# Patient Record
Sex: Female | Born: 1975
Health system: Southern US, Community
[De-identification: ages and names within clinical notes are randomized; demographics above are authoritative.]

## PROBLEM LIST (undated history)

## (undated) DIAGNOSIS — M722 Plantar fascial fibromatosis: Secondary | ICD-10-CM

## (undated) DIAGNOSIS — J329 Chronic sinusitis, unspecified: Secondary | ICD-10-CM

## (undated) DIAGNOSIS — H8110 Benign paroxysmal vertigo, unspecified ear: Secondary | ICD-10-CM

## (undated) DIAGNOSIS — T7840XA Allergy, unspecified, initial encounter: Secondary | ICD-10-CM

## (undated) DIAGNOSIS — N200 Calculus of kidney: Secondary | ICD-10-CM

## (undated) DIAGNOSIS — G4730 Sleep apnea, unspecified: Secondary | ICD-10-CM

## (undated) DIAGNOSIS — E860 Dehydration: Secondary | ICD-10-CM

## (undated) DIAGNOSIS — I10 Essential (primary) hypertension: Secondary | ICD-10-CM

## (undated) DIAGNOSIS — Z0000 Encounter for general adult medical examination without abnormal findings: Secondary | ICD-10-CM

## (undated) DIAGNOSIS — E785 Hyperlipidemia, unspecified: Secondary | ICD-10-CM

## (undated) DIAGNOSIS — J029 Acute pharyngitis, unspecified: Secondary | ICD-10-CM

## (undated) DIAGNOSIS — R51 Headache: Secondary | ICD-10-CM

## (undated) DIAGNOSIS — F419 Anxiety disorder, unspecified: Secondary | ICD-10-CM

## (undated) DIAGNOSIS — B019 Varicella without complication: Secondary | ICD-10-CM

## (undated) DIAGNOSIS — R5383 Other fatigue: Secondary | ICD-10-CM

## (undated) DIAGNOSIS — H539 Unspecified visual disturbance: Secondary | ICD-10-CM

## (undated) DIAGNOSIS — N823 Fistula of vagina to large intestine: Secondary | ICD-10-CM

## (undated) DIAGNOSIS — M549 Dorsalgia, unspecified: Secondary | ICD-10-CM

## (undated) DIAGNOSIS — N189 Chronic kidney disease, unspecified: Secondary | ICD-10-CM

## (undated) DIAGNOSIS — E663 Overweight: Secondary | ICD-10-CM

## (undated) DIAGNOSIS — R030 Elevated blood-pressure reading, without diagnosis of hypertension: Secondary | ICD-10-CM

## (undated) HISTORY — DX: Dorsalgia, unspecified: M54.9

## (undated) HISTORY — DX: Calculus of kidney: N20.0

## (undated) HISTORY — DX: Anxiety disorder, unspecified: F41.9

## (undated) HISTORY — PX: PERINEAL BODY REPAIR: SHX2216

## (undated) HISTORY — DX: Other fatigue: R53.83

## (undated) HISTORY — DX: Acute pharyngitis, unspecified: J02.9

## (undated) HISTORY — PX: OTHER SURGICAL HISTORY: SHX169

## (undated) HISTORY — PX: WISDOM TOOTH EXTRACTION: SHX21

## (undated) HISTORY — DX: Chronic sinusitis, unspecified: J32.9

## (undated) HISTORY — DX: Allergy, unspecified, initial encounter: T78.40XA

## (undated) HISTORY — DX: Dehydration: E86.0

## (undated) HISTORY — DX: Plantar fascial fibromatosis: M72.2

## (undated) HISTORY — DX: Overweight: E66.3

## (undated) HISTORY — DX: Headache: R51

## (undated) HISTORY — DX: Sleep apnea, unspecified: G47.30

## (undated) HISTORY — DX: Unspecified visual disturbance: H53.9

## (undated) HISTORY — DX: Hyperlipidemia, unspecified: E78.5

## (undated) HISTORY — DX: Fistula of vagina to large intestine: N82.3

## (undated) HISTORY — DX: Encounter for general adult medical examination without abnormal findings: Z00.00

## (undated) HISTORY — DX: Benign paroxysmal vertigo, unspecified ear: H81.10

## (undated) HISTORY — DX: Varicella without complication: B01.9

## (undated) HISTORY — PX: GUM SURGERY: SHX658

## (undated) HISTORY — DX: Elevated blood-pressure reading, without diagnosis of hypertension: R03.0

---

## 1998-03-09 ENCOUNTER — Other Ambulatory Visit: Admission: RE | Admit: 1998-03-09 | Discharge: 1998-03-09 | Payer: Self-pay | Admitting: Obstetrics and Gynecology

## 1999-03-16 ENCOUNTER — Other Ambulatory Visit: Admission: RE | Admit: 1999-03-16 | Discharge: 1999-03-16 | Payer: Self-pay | Admitting: Obstetrics and Gynecology

## 2000-04-17 ENCOUNTER — Other Ambulatory Visit: Admission: RE | Admit: 2000-04-17 | Discharge: 2000-04-17 | Payer: Self-pay | Admitting: Obstetrics and Gynecology

## 2001-05-19 ENCOUNTER — Other Ambulatory Visit: Admission: RE | Admit: 2001-05-19 | Discharge: 2001-05-19 | Payer: Self-pay | Admitting: Obstetrics and Gynecology

## 2002-06-11 ENCOUNTER — Other Ambulatory Visit: Admission: RE | Admit: 2002-06-11 | Discharge: 2002-06-11 | Payer: Self-pay | Admitting: Obstetrics and Gynecology

## 2003-07-05 ENCOUNTER — Other Ambulatory Visit: Admission: RE | Admit: 2003-07-05 | Discharge: 2003-07-05 | Payer: Self-pay | Admitting: Obstetrics and Gynecology

## 2004-03-28 ENCOUNTER — Encounter: Admission: RE | Admit: 2004-03-28 | Discharge: 2004-03-28 | Payer: Self-pay | Admitting: Obstetrics and Gynecology

## 2004-06-13 ENCOUNTER — Encounter: Admission: RE | Admit: 2004-06-13 | Discharge: 2004-06-13 | Payer: Self-pay | Admitting: Family Medicine

## 2004-07-05 ENCOUNTER — Other Ambulatory Visit: Admission: RE | Admit: 2004-07-05 | Discharge: 2004-07-05 | Payer: Self-pay | Admitting: Obstetrics and Gynecology

## 2004-11-06 ENCOUNTER — Other Ambulatory Visit: Admission: RE | Admit: 2004-11-06 | Discharge: 2004-11-06 | Payer: Self-pay | Admitting: Obstetrics and Gynecology

## 2004-11-07 ENCOUNTER — Emergency Department (HOSPITAL_COMMUNITY): Admission: EM | Admit: 2004-11-07 | Discharge: 2004-11-07 | Payer: Self-pay

## 2005-05-25 ENCOUNTER — Inpatient Hospital Stay (HOSPITAL_COMMUNITY): Admission: AD | Admit: 2005-05-25 | Discharge: 2005-05-25 | Payer: Self-pay | Admitting: Obstetrics and Gynecology

## 2005-05-29 ENCOUNTER — Inpatient Hospital Stay (HOSPITAL_COMMUNITY): Admission: AD | Admit: 2005-05-29 | Discharge: 2005-06-02 | Payer: Self-pay | Admitting: Obstetrics and Gynecology

## 2005-06-04 ENCOUNTER — Encounter: Admission: RE | Admit: 2005-06-04 | Discharge: 2005-06-20 | Payer: Self-pay | Admitting: Obstetrics and Gynecology

## 2005-06-13 ENCOUNTER — Inpatient Hospital Stay (HOSPITAL_COMMUNITY): Admission: AD | Admit: 2005-06-13 | Discharge: 2005-06-15 | Payer: Self-pay | Admitting: Obstetrics and Gynecology

## 2005-07-18 ENCOUNTER — Other Ambulatory Visit: Admission: RE | Admit: 2005-07-18 | Discharge: 2005-07-18 | Payer: Self-pay | Admitting: Obstetrics and Gynecology

## 2005-07-22 LAB — HM COLONOSCOPY

## 2007-09-25 ENCOUNTER — Encounter: Admission: RE | Admit: 2007-09-25 | Discharge: 2007-09-25 | Payer: Self-pay | Admitting: Obstetrics and Gynecology

## 2008-01-06 ENCOUNTER — Ambulatory Visit: Payer: Self-pay | Admitting: Gastroenterology

## 2008-01-28 ENCOUNTER — Ambulatory Visit: Payer: Self-pay | Admitting: Gastroenterology

## 2010-01-20 ENCOUNTER — Encounter: Admission: RE | Admit: 2010-01-20 | Discharge: 2010-01-20 | Payer: Self-pay | Admitting: Family Medicine

## 2010-01-25 ENCOUNTER — Encounter: Admission: RE | Admit: 2010-01-25 | Discharge: 2010-01-25 | Payer: Self-pay | Admitting: Family Medicine

## 2010-06-27 DIAGNOSIS — R030 Elevated blood-pressure reading, without diagnosis of hypertension: Secondary | ICD-10-CM | POA: Insufficient documentation

## 2010-06-27 DIAGNOSIS — E559 Vitamin D deficiency, unspecified: Secondary | ICD-10-CM | POA: Insufficient documentation

## 2010-06-27 DIAGNOSIS — R519 Headache, unspecified: Secondary | ICD-10-CM | POA: Insufficient documentation

## 2010-07-20 DIAGNOSIS — J309 Allergic rhinitis, unspecified: Secondary | ICD-10-CM | POA: Insufficient documentation

## 2010-11-11 ENCOUNTER — Encounter: Payer: Self-pay | Admitting: Obstetrics and Gynecology

## 2010-11-12 ENCOUNTER — Encounter: Payer: Self-pay | Admitting: Family Medicine

## 2010-12-18 DIAGNOSIS — B029 Zoster without complications: Secondary | ICD-10-CM | POA: Insufficient documentation

## 2010-12-18 HISTORY — DX: Zoster without complications: B02.9

## 2011-01-21 LAB — HM PAP SMEAR

## 2011-03-06 NOTE — Assessment & Plan Note (Signed)
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 Noland Fordyce, Dr. Herb Grays, and Dr. Richarda Overlie for  preoperative colonoscopy for planned upcoming rectovaginal fistula  repair in the next several months.   Evolet had a vaginal delivery in the summer of 2006 which was  complicated by a perineal tear and a rectovaginal fistula involving the  distal 4 cm of the vagina.  She had repair of this fistula by Dr.  Marcelle Overlie on June 14, 2005 and really did well for several  years until the last several months when she again has noticed a foul  discharge from her rectum and some rectal incontinency.  She denies  abdominal pain, diarrhea, or rectal bleeding.  She also had no upper GI  or hepatobiliary complaints.  Her appetite is good and her weight is  stable.  She denies passing stool from her vagina at this time.   She has no history of inflammatory bowel disease or any extracolonic  manifestations of disease such as arthritis, skin rashes, mouth sores,  etc.  She has no specific food intolerances.  It is of note that the  patient had a barium enema performed on September 25, 2007 by Dr. Janeece Riggers. This showed a small connection between the low rectum and the  lower vaginal area with delayed images at 15 seconds showing contrast  within the upper portion of the vagina.  The balloon was then inflated  and the rest of the colon generally appeared normal including terminal  ileal reflux.   Since that time, the patient has been seen by Dr. Hazle Coca at  [facility] who has planned repair of her rectovaginal fistula  after colonoscopy exam.  He did do a rectovaginal  exam in his office and  was able to see a small fistula at that time and it was delineated with  a small block probe.   PAST MEDICAL HISTORY:  Otherwise noncontributory.   MEDICATIONS:  Depo shots every 3 months.  She denies drug allergies.   FAMILY HISTORY:  Noncontributory.   SOCIAL HISTORY:  She is married and lives with her husband and has one  child.  She has a 12th grade education.  She does not smoke or use  ethanol or give a  history of illicit drug use.   REVIEW OF SYSTEMS:  Entire noncontributory without current  cardiovascular, pulmonary, genitourinary, gynecological, or other  general medical problems.  Her last menstrual period was in November of  2005.   EXAM:  She is a healthy female in no acute distress appearing her stated  age.  She is 5'6 and weighs 218 pounds.  Blood pressure is 122/80 and pulse is 76 and regular.  I could not appreciate stigmata of chronic liver disease or thyromegaly.  Her chest is  clear and she is in a regular rhythm without murmurs,  gallops, or rubs.  I could not appreciate hepatosplenomegaly, abdominal masses, or  tenderness.  Bowel sounds were normal.  Upper extremities were unremarkable.  Inspection of the rectum was generally unremarkable.  I did not perform  a rectal exam or proctoscopy.   ASSESSMENT:  Mrs. Flicker has what sounds like a small rectovaginal  fistula with a previous repair surgically in August of 2006 by Dr.  Vincente Poli.  As per Dr. Scharlene Corn note, this may be a simple operation and he  does not think this will require any extensive invasive procedures.  It  is certainly unlikely the patient has underlying inflammatory bowel  disease or colon polyps, but I would agree that colonoscopy is indicated  before her upcoming surgical procedure.   RECOMMENDATIONS:  I have gone ahead and set colonoscopy up for Mrs.  Sanda at her convenience.  The risks and benefits of this procedure  have been explained in detail and we will  proceed as planned.     Vania Rea. Jarold Motto, MD, Caleen Essex, FAGA  Electronically Signed    DRP/MedQ  DD: 01/06/2008  DT: 01/06/2008  Job #: [id number]   cc:   Ave Filter. Marcelle Overlie, M.D.  Tammy R. Collins [place], M.D.

## 2011-03-09 NOTE — Discharge Summary (Signed)
Erika Weaver, Erika Weaver             ACCOUNT NO.:  [id number]   MEDICAL RECORD NO.:  [id number]          PATIENT TYPE:  INP   LOCATION:  9316                          FACILITY:  [facility]   PHYSICIAN:  Michelle L. Grewal, M.D.DATE OF BIRTH:  Jan 13, 1976   DATE OF ADMISSION:  06/13/2005  DATE OF DISCHARGE:  06/15/2005                                 DISCHARGE SUMMARY   ADMISSION DIAGNOSIS:  Rectovaginal fistula.   DISCHARGE DIAGNOSES:  1.  Rectovaginal fistula.  2.  Status post repair of rectovaginal fistula.   HOSPITAL COURSE:  The patient is a 35 year old female status post vaginal  delivery on May 31, 2005 who presented to the office complaining of stool  passing through her vagina. She had had a third degree laceration, according  to her delivery note. The patient was noted on exam to have a rectovaginal  fistula. She was admitted and placed on sitz baths and antibiotics. The  following day, the patient went to the operating room and had a repair of a  rectovaginal fistula without incident. The patient did very well  postoperatively. By postoperative day #1 she was ambulating, tolerating her  pain very well, and had minimal edema noted. She was discharged home with  pain medication as well as stool softeners. She was advised to follow up in  the office in 1 week. She was advised no driving. She was advised to call if  she notices any more drainage of stool per vagina or any temperature greater  than 100.5 or increased pain in the vagina, and follow up in 1 week for  another exam.           ______________________________  Stann Mainland. Vincente Poli, M.D.     Erika Weaver  D:  08/01/2005  T:  08/01/2005  Job:  [id number]

## 2011-03-09 NOTE — Op Note (Signed)
NAMEFERMINA, Erika Weaver             ACCOUNT NO.:  [id number]   MEDICAL RECORD NO.:  [id number]          PATIENT TYPE:  INP   LOCATION:  9316                          FACILITY:  [facility]   PHYSICIAN:  Michelle L. Grewal, M.D.DATE OF BIRTH:  [birth year]   DATE OF PROCEDURE:  06/14/2005  DATE OF DISCHARGE:                                 OPERATIVE REPORT   PREOPERATIVE DIAGNOSIS:  Rectovaginal fistula.   POSTOPERATIVE DIAGNOSIS:  Rectovaginal fistula.   PROCEDURE:  Repair of rectovaginal fistula.   SURGEON:  Michelle L. Vincente Poli, M.D.   ANESTHESIA:  LMA.   SPECIMENS:  None.   ESTIMATED BLOOD LOSS:  100 mL.   PROCEDURE:  The patient is taken to the operating room and given anesthesia.  She is prepped and draped in the usual sterile fashion and the patient is in  the low lithotomy position.  Exam under anesthesia reveals a complete  breakdown of her episiotomy repair and a rectovaginal fistula involving the  distal 4 cm of the vagina.  There was some stool in the rectum that is  removed.  We then placed Allis clamps just on each side of the rectal  mucosa, and I trimmed off some of the edges that appeared a little friable  and then used a 4-0 Vicryl suture, closed the rectal mucosa with continuous  running stitch starting distally and moving proximally, and then used this  in two layers.  After this was closed, I then placed Allis clamps and  grasped each side of the anal sphincter and then using four figure-of-eight  sutures using 2-0 Vicryl suture, then reapproximated the anal sphincter in  the standard fashion.  After repair of this, I then placed a series of  interrupteds using 0 Vicryl suture between the vagina and the rectal mucosa  and closed the rectovaginal fascia and reapproximated it in the midline.  That way there would be a layer of healthy tissue between the rectum and the  vagina.  I then closed the vaginal epithelium using 2-0 Vicryl in a  continuous running stitch starting  distally and then moving proximally.  We  then closed the skin of the perineum using 2-0 Vicryl in a subcuticular  fashion.  At the end of the procedure, a rectal exam revealed that there was  no rectal defect noted, no stitches were noted in the rectum.  A vaginal  packing soaked with Premarin cream was placed in the vagina.  A Foley  catheter was inserted into the bladder.  All sponge, lap and instrument  counts were correct x2.  The patient went to the recovery room in stable  condition.      Michelle L. Vincente Poli, M.D.  Electronically Signed     MLG/MEDQ  D:  06/14/2005  T:  06/14/2005  Job:  [id number]

## 2011-11-22 ENCOUNTER — Encounter: Payer: Self-pay | Admitting: Family Medicine

## 2011-11-22 ENCOUNTER — Ambulatory Visit (INDEPENDENT_AMBULATORY_CARE_PROVIDER_SITE_OTHER): Payer: Self-pay | Admitting: Family Medicine

## 2011-11-22 DIAGNOSIS — E669 Obesity, unspecified: Secondary | ICD-10-CM | POA: Insufficient documentation

## 2011-11-22 DIAGNOSIS — J329 Chronic sinusitis, unspecified: Secondary | ICD-10-CM | POA: Insufficient documentation

## 2011-11-22 DIAGNOSIS — I10 Essential (primary) hypertension: Secondary | ICD-10-CM | POA: Insufficient documentation

## 2011-11-22 DIAGNOSIS — F419 Anxiety disorder, unspecified: Secondary | ICD-10-CM | POA: Insufficient documentation

## 2011-11-22 DIAGNOSIS — E663 Overweight: Secondary | ICD-10-CM

## 2011-11-22 DIAGNOSIS — H539 Unspecified visual disturbance: Secondary | ICD-10-CM

## 2011-11-22 DIAGNOSIS — Z8249 Family history of ischemic heart disease and other diseases of the circulatory system: Secondary | ICD-10-CM

## 2011-11-22 DIAGNOSIS — T7840XA Allergy, unspecified, initial encounter: Secondary | ICD-10-CM

## 2011-11-22 DIAGNOSIS — R030 Elevated blood-pressure reading, without diagnosis of hypertension: Secondary | ICD-10-CM

## 2011-11-22 DIAGNOSIS — R519 Headache, unspecified: Secondary | ICD-10-CM

## 2011-11-22 DIAGNOSIS — F411 Generalized anxiety disorder: Secondary | ICD-10-CM

## 2011-11-22 DIAGNOSIS — R51 Headache: Secondary | ICD-10-CM

## 2011-11-22 DIAGNOSIS — Z0000 Encounter for general adult medical examination without abnormal findings: Secondary | ICD-10-CM

## 2011-11-22 HISTORY — DX: Chronic sinusitis, unspecified: J32.9

## 2011-11-22 HISTORY — DX: Headache, unspecified: R51.9

## 2011-11-22 HISTORY — DX: Unspecified visual disturbance: H53.9

## 2011-11-22 HISTORY — DX: Allergy, unspecified, initial encounter: T78.40XA

## 2011-11-22 HISTORY — DX: Anxiety disorder, unspecified: F41.9

## 2011-11-22 LAB — LIPID PANEL
Cholesterol: 200 mg/dL (ref 0–200)
HDL: 42.4 mg/dL (ref >39.00)
Total CHOL/HDL Ratio: 5
Triglycerides: 153 mg/dL — ABNORMAL HIGH (ref 0.0–149.0)

## 2011-11-22 LAB — RENAL FUNCTION PANEL
Albumin: 3.7 g/dL (ref 3.5–5.2)
BUN: 13 mg/dL (ref 6–23)
Chloride: 104 mEq/L (ref 96–112)
GFR: 99.95 mL/min (ref >60.00)
Glucose, Bld: 80 mg/dL (ref 70–99)
Phosphorus: 3.1 mg/dL (ref 2.3–4.6)

## 2011-11-22 LAB — HEPATIC FUNCTION PANEL
Albumin: 3.7 g/dL (ref 3.5–5.2)
Alkaline Phosphatase: 74 U/L (ref 39–117)
Total Protein: 7.6 g/dL (ref 6.0–8.3)

## 2011-11-22 LAB — CBC
Hemoglobin: 14 g/dL (ref 12.0–15.0)
MCHC: 34.3 g/dL (ref 30.0–36.0)
MCV: 92.7 fl (ref 78.0–100.0)
RDW: 12.4 % (ref 11.5–14.6)

## 2011-11-22 LAB — TSH: TSH: 0.51 u[IU]/mL (ref 0.35–5.50)

## 2011-11-22 MED ORDER — ALIGN PO CAPS: 1.0000 | ORAL_CAPSULE | Freq: Every day | ORAL | Status: DC

## 2011-11-22 MED ORDER — GUAIFENESIN ER 600 MG PO TB12: 600.0000 mg | ORAL_TABLET | Freq: Two times a day (BID) | ORAL | Status: DC

## 2011-11-22 MED ORDER — SALINE 0.65 % NA SOLN: 1.0000 | Freq: Two times a day (BID) | NASAL | Status: DC

## 2011-11-22 MED ORDER — AMOXICILLIN 500 MG PO CAPS: 500.0000 mg | ORAL_CAPSULE | Freq: Three times a day (TID) | ORAL | Status: AC

## 2011-11-22 MED ORDER — LORATADINE 10 MG PO TABS: 10.0000 mg | ORAL_TABLET | Freq: Every day | ORAL | Status: DC

## 2011-11-22 NOTE — Assessment & Plan Note (Signed)
Patient has been struggling with some intermittent right ear pain, low-grade cough and increased pressure and pain on the right side of her head. She has had some clear rhinorrhea. Has some symptoms of allergies but also suspect a low-grade sinus infection. We'll start amoxicillin 500 mg 3 times a day as well as Mucinex and to have her return for further evaluation

## 2011-11-22 NOTE — Progress Notes (Signed)
Patient ID: Erika Weaver, female   DOB: July 01, 1976, 36 y.o.   MRN: [id number] Erika Weaver [id number] [birth year] 11/22/2011      Progress Note-Follow Up  Subjective  Chief Complaint  Chief Complaint  Patient presents with  . Establish Care    new patient    HPI  Patient is a 36 year old Caucasian female who is in today for new patient appointment. She is most concerned about an episode last week of right-sided headache that also had visual changes in her right eye and paresthesias in her fingertips bilaterally. The symptoms have resolved at this time. She did see her eye doctor [facility] and that was unremarkable she saw her previous PMD and was treated with a medication although she's unsure what it was. Over the next day her headache resolved. She does also note increased congestion, clear rhinorrhea, facial pressure, right ear pain present for several weeks now. No nausea photophobia or phonophobia. No sore throat. No other neurologic complaints. She reports a difficult delivery of her sexual resulted in a fourth degree tear ultimately a rectal fissure and multiple surgeries to repair. Otherwise she reports being in good health and is asked to consider an another pregnancy. No recent fevers, chills, illness, chest pain, palpitations, shortness of breath. She noticed being very anxious about her family history of brain aneurysms and her recent trouble with headaches. She has struggled with headaches in the past as well. She notes her blood pressures frequently elevated when she comes to a doctor's office.  Past Medical History  Diagnosis Date  . Chicken pox as a child  . Overweight   . Headache 11/22/2011  . Allergic state 11/22/2011  . Visual changes 11/22/2011  . Elevated BP 11/22/2011  . Anxiety 11/22/2011  . Sinusitis 11/22/2011    Past Surgical History  Procedure Date  . Gum surgery   . Wisdom tooth extraction     Family History  Problem Relation Age of Onset    . Diabetes Mother     type 2  . Hypertension Mother   . Hyperlipidemia Mother   . Hypertension Father   . Hyperlipidemia Father   . Cancer Maternal Grandmother 42    colon  . Other Maternal Grandfather 41    brain hemorrhage  . Stroke Maternal Grandfather   . Heart attack Paternal Grandmother   . Heart disease Paternal Grandmother     MI  . Alzheimer's disease Paternal Grandfather   . Dementia Paternal Grandfather   . Heart attack Paternal Aunt   . Heart disease Paternal Aunt     MI  . Other Paternal Uncle     cerebreal brain hemorrhage  . Stroke Paternal Uncle   . Asthma Daughter   . Other Daughter     peanut allergy    History   Social History  . Marital Status: Married    Spouse Name: N/A    Number of Children: N/A  . Years of Education: N/A   Occupational History  . Not on file.   Social History Main Topics  . Smoking status: Never Smoker   . Smokeless tobacco: Never Used  . Alcohol Use: No  . Drug Use: No  . Sexually Active: Yes -- Female partner(s)   Other Topics Concern  . Not on file   Social History Narrative  . No narrative on file    No current outpatient prescriptions on file prior to visit.    No Known Allergies  Review of Systems  Review of Systems  Constitutional: Negative for fever, chills and malaise/fatigue.  HENT: Negative for hearing loss, nosebleeds and congestion.   Eyes: Positive for blurred vision. Negative for pain and discharge.  Respiratory: Negative for cough, sputum production, shortness of breath and wheezing.   Cardiovascular: Negative for chest pain, palpitations and leg swelling.  Gastrointestinal: Negative for heartburn, nausea, vomiting, abdominal pain, diarrhea, constipation and blood in stool.  Genitourinary: Negative for dysuria, urgency, frequency and hematuria.  Musculoskeletal: Negative for myalgias, back pain and falls.  Skin: Negative for rash.  Neurological: Positive for tingling and headaches. Negative  for dizziness, tremors, sensory change, focal weakness, loss of consciousness and weakness.  Endo/Heme/Allergies: Negative for polydipsia. Does not bruise/bleed easily.  Psychiatric/Behavioral: Negative for depression and suicidal ideas. The patient is nervous/anxious. The patient does not have insomnia.     Objective  BP 136/86  Pulse 80  Temp(Src) [name] (Temporal)  Ht 5' 5.75" (1.67 m)  Wt 221 lb 1.9 oz (100.299 kg)  BMI 35.96 kg/m2  SpO2 100%  LMP 11/19/2011  Physical Exam  Physical Exam  Constitutional: She is oriented to person, place, and time and well-developed, well-nourished, and in no distress. No distress.  HENT:  Head: Normocephalic and atraumatic.  Right Ear: External ear normal.  Left Ear: External ear normal.  Nose: Nose normal.  Mouth/Throat: Oropharynx is clear and moist. No oropharyngeal exudate.  Eyes: Conjunctivae are normal. Pupils are equal, round, and reactive to light. Right eye exhibits no discharge. Left eye exhibits no discharge. No scleral icterus.  Neck: Normal range of motion. Neck supple. No thyromegaly present.  Cardiovascular: Normal rate, regular rhythm, normal heart sounds and intact distal pulses.   No murmur heard. Pulmonary/Chest: Effort normal and breath sounds normal. No respiratory distress. She has no wheezes. She has no rales.  Abdominal: Soft. Bowel sounds are normal. She exhibits no distension and no mass. There is no tenderness.  Musculoskeletal: Normal range of motion. She exhibits no edema and no tenderness.  Lymphadenopathy:    She has no cervical adenopathy.  Neurological: She is alert and oriented to person, place, and time. She has normal reflexes. No cranial nerve deficit. Coordination normal.  Skin: Skin is warm and dry. No rash noted. She is not diaphoretic.  Psychiatric: Mood, memory and affect normal.    Lab Results  Component Value Date   TSH 0.51 11/22/2011   Lab Results  Component Value Date   WBC  10.4 11/22/2011   HGB 14.0 11/22/2011   HCT 40.8 11/22/2011   MCV 92.7 11/22/2011   PLT 285.0 11/22/2011   Lab Results  Component Value Date   CREATININE 0.7 11/22/2011   BUN 13 11/22/2011   NA 139 11/22/2011   K 3.6 11/22/2011   CL 104 11/22/2011   CO2 28 11/22/2011   Lab Results  Component Value Date   ALT 25 11/22/2011   AST 17 11/22/2011   ALKPHOS 74 11/22/2011   BILITOT 0.2* 11/22/2011   Lab Results  Component Value Date   CHOL 200 11/22/2011   Lab Results  Component Value Date   HDL 42.40 11/22/2011   Lab Results  Component Value Date   LDLCALC 127* 11/22/2011   Lab Results  Component Value Date   TRIG 153.0* 11/22/2011   Lab Results  Component Value Date   CHOLHDL 5 11/22/2011     Assessment & Plan  Allergic state Loratadine 10 mg daily, Ayr twice daily  Overweight Encouraged DASH diet. Increase  activity level  Headache Reports a long history of intermittent headaches the last week had an episode that was worrisome to her. She had increased right-sided headache and then had some difficulty with her vision in her right eye feeling blurred. She initially went to her optometrist [facility] and her eye exam was normal. She went to her previous PMD and was told to migraines. Because she is contemplating pregnancy she was only treated with Tylenol and headache did persist for a few more days. At present her visual changes are improved. She is very anxious about these headaches and does have a very significant family history for brain aneurysms on both sides of her family. His headaches have not had associated nausea photophobia or phonophobia. She is also noting some paresthesias in her fingertips which occurred during the same period of time. It occurred bilaterally and has resolved now.   Elevated BP Minimize sodium and recheck at next visit  Visual changes Resolved, will continue to monitor  Anxiety Patient acknowledges her anxiety level over her family history  of brain aneurysm and her headaches an visual symptoms, if her anxiety worsens or her neuro work up is negative we may have to consider treating this  Sinusitis Patient has been struggling with some intermittent right ear pain, low-grade cough and increased pressure and pain on the right side of her head. She has had some clear rhinorrhea. Has some symptoms of allergies but also suspect a low-grade sinus infection. We'll start amoxicillin 500 mg 3 times a day as well as Mucinex and to have her return for further evaluation

## 2011-11-22 NOTE — Assessment & Plan Note (Signed)
Reports a long history of intermittent headaches the last week had an episode that was worrisome to her. She had increased right-sided headache and then had some difficulty with her vision in her right eye feeling blurred. She initially went to her optometrist [facility] and her eye exam was normal. She went to her previous PMD and was told to migraines. Because she is contemplating pregnancy she was only treated with Tylenol and headache did persist for a few more days. At present her visual changes are improved. She is very anxious about these headaches and does have a very significant family history for brain aneurysms on both sides of her family. His headaches have not had associated nausea photophobia or phonophobia. She is also noting some paresthesias in her fingertips which occurred during the same period of time. It occurred bilaterally and has resolved now.

## 2011-11-22 NOTE — Patient Instructions (Addendum)

## 2011-11-22 NOTE — Assessment & Plan Note (Signed)
Patient acknowledges her anxiety level over her family history of brain aneurysm and her headaches an visual symptoms, if her anxiety worsens or her neuro work up is negative we may have to consider treating this

## 2011-11-22 NOTE — Assessment & Plan Note (Signed)
Resolved, will continue to monitor 

## 2011-11-22 NOTE — Assessment & Plan Note (Signed)
Encouraged DASH diet. Increase activity level

## 2011-11-22 NOTE — Assessment & Plan Note (Signed)
Minimize sodium and recheck at next visit

## 2011-11-22 NOTE — Assessment & Plan Note (Addendum)
Loratadine 10 mg daily, Ayr twice daily

## 2011-11-26 ENCOUNTER — Telehealth: Payer: Self-pay

## 2011-11-26 NOTE — Telephone Encounter (Signed)
Pt stated she can't get into Neurology until the middle of March? Pt states her headaches are a little better but she feels really lightheaded? Pt wants to know if theres anything she should do before her appt in March? Please advise?

## 2011-11-26 NOTE — Telephone Encounter (Signed)
So we can use a different neurologist but if she is somewhat better it would be great to wait. For the light headed feeling that is tricky, stay well hydrated and do not skip meals. Come in and let me check her vitals if she is worried and consider drinking 1 Gatorade beverage daily

## 2011-11-26 NOTE — Telephone Encounter (Signed)
Pt informed and will just keep the appt she has for now

## 2011-12-26 ENCOUNTER — Ambulatory Visit (INDEPENDENT_AMBULATORY_CARE_PROVIDER_SITE_OTHER): Payer: BC Managed Care – PPO | Admitting: Neurology

## 2011-12-26 ENCOUNTER — Encounter: Payer: Self-pay | Admitting: Neurology

## 2011-12-26 DIAGNOSIS — R42 Dizziness and giddiness: Secondary | ICD-10-CM

## 2011-12-26 DIAGNOSIS — R51 Headache: Secondary | ICD-10-CM

## 2011-12-26 DIAGNOSIS — Z8249 Family history of ischemic heart disease and other diseases of the circulatory system: Secondary | ICD-10-CM

## 2011-12-26 NOTE — Patient Instructions (Signed)
Your MRI is scheduled for Monday, March 11th at 9:00am.  Please arrive to [facility], first floor admitting by 8:45am.  [phone].

## 2011-12-26 NOTE — Progress Notes (Signed)
Dear Dr. Abner Greenspan,  Thank you for having me see Erika Weaver in consultation today at [facility] for her problem with dizziness and headaches..  As you may recall, she is a 36 y.o. year old female with a history of anxiety who presents with worsening headaches over last 2 months. These are variably localized without photophobia or phonophobia. She does not think she gets nausea with them. They're typically taken away after about 30 minutes to an hour with Tylenol. On 2 occasions she has had blurriness of the vision right greater than left and tingling in the hands.  She also complains of dizziness/lightheadedness. Sometimes it seems almost vertiginous. Typically this is when she sits up too quickly. However it can occur at other times. Is not clearly brought on by changes in head position. There've been no pulsatile tinnitus or transient visual obscurations. She does not endorse any precipitating event in January.  She does have a history of headaches in the past but been mild. She's not so concerned about the headaches and dizziness it appears but rather that she has a history of cerebral aneurysm in the family. She is wondering whether this could be a cause of her symptoms.  Medical History:  Obesity, anxiety  Surgical History: Wisdom tooth extraction, perineal body repair  Social History: She does not smoke tobacco or use alcohol.  Family History: Mother's sister had a cerebral aneurysm. Maternal grandfather had an intracerebral hemorrhage.  Medications: Prenatal vitamins, loratadine  No Known Allergies    Review of systems:  13 systems were reviewed and are unremarkable.   Examination:  Filed Vitals:   12/26/11 0911  BP: 122/86  Pulse: 76  Height: 5' 5.75" (1.67 m)  Weight: 223 lb (101.152 kg)     In general, well appearing women.  Cardiovascular: The patient has a regular rate and rhythm and no carotid bruits.  Fundoscopy:  Disks are flat. Vessel caliber  within normal limits.  Mental status:   The patient is oriented to person, place and time. Recent and remote memory are intact. Attention span and concentration are normal. Language including repetition, naming, following commands are intact. Fund of knowledge of current and historical events, as well as vocabulary are normal.  Cranial Nerves: Pupils are equally round and reactive to light. Visual fields full to confrontation. Extraocular movements are intact without nystagmus. Facial sensation and muscles of mastication are intact. Muscles of facial expression are symmetric. Hearing intact to bilateral finger rub. Tongue protrusion, uvula, palate midline.  Shoulder shrug intact  Motor:  The patient has normal bulk and tone, no pronator drift.  There are no adventitious movements.  5/5 muscle strength bilaterally.  Reflexes:   Biceps  Triceps Brachioradialis Knee Ankle  Right 2+  2+  2+   2+ 2+  Left  2+  2+  2+   2+ 2+  Toes down  Coordination:  Normal finger to nose.  No dysdiadokinesia.  Sensation is intact to temperature and vibration.  Gait and Station are normal.  Tandem gait is intact.  Romberg is negative  Vest:  - head thrust, - Dix Hallpike  Impression and Recommendations: 1.  New worsening of headaches in the setting of non-first degree history of cerebral aneurysm.  I don't think these headaches could be at all related to aneurysms.  However, given the worsening of headaches I will get an MRI of her brain and an MRA of her head due to her family history.  She will continue to  use Tylenol for her headaches.  I am reluctant to use anything else at this time as she is interested in getting pregnant and Tylenol seems to relieve the headaches. 2.  Dizziness - non specific;  I have encouraged her to increase her fluid intake and try to eat regularly to see if this helps.  We will see the patient back in 2 months.  Thank you for having [place] see Erika Weaver in consultation.   Feel free to contact me with any questions.  Lupita Raider Modesto Charon, MD [facility], [place] [address] [place], [place] [address] Phone: [phone] Fax: [phone].

## 2011-12-31 ENCOUNTER — Other Ambulatory Visit (HOSPITAL_COMMUNITY): Payer: BC Managed Care – PPO

## 2012-01-01 ENCOUNTER — Other Ambulatory Visit (HOSPITAL_COMMUNITY): Payer: BC Managed Care – PPO

## 2012-01-01 ENCOUNTER — Ambulatory Visit (HOSPITAL_COMMUNITY)
Admission: RE | Admit: 2012-01-01 | Discharge: 2012-01-01 | Disposition: A | Discharge status: Home / Self Care | Payer: BC Managed Care – PPO | Source: Ambulatory Visit | Attending: Neurology | Admitting: Neurology

## 2012-01-01 DIAGNOSIS — R42 Dizziness and giddiness: Secondary | ICD-10-CM | POA: Insufficient documentation

## 2012-01-01 DIAGNOSIS — R51 Headache: Secondary | ICD-10-CM | POA: Insufficient documentation

## 2012-01-01 DIAGNOSIS — Z8249 Family history of ischemic heart disease and other diseases of the circulatory system: Secondary | ICD-10-CM

## 2012-01-01 MED ORDER — GADOBENATE DIMEGLUMINE 529 MG/ML IV SOLN
20.0000 mL | Freq: Once | INTRAVENOUS | Status: AC | PRN
  Administered 2012-01-01: 20 mL via INTRAVENOUS

## 2012-01-04 ENCOUNTER — Telehealth: Payer: Self-pay | Admitting: Neurology

## 2012-01-04 NOTE — Telephone Encounter (Signed)
Message copied by Benay Spice on Fri Jan 04, 2012 11:11 AM ------      Message from: Denton Meek H      Created: Thu Jan 03, 2012 10:34 PM       Let Ms. Hayter know that her MRI looked ok with no sign of aneurysm.

## 2012-01-04 NOTE — Telephone Encounter (Signed)
I left the patient a message on her mobile phone that the MRI was normal with no sign of an aneurysm. Asked that she call if questions.

## 2012-01-04 NOTE — Telephone Encounter (Signed)
Message copied by Benay Spice on Fri Jan 04, 2012 12:21 PM ------      Message from: Denton Meek H      Created: Thu Jan 03, 2012 10:34 PM       Let Ms. Rhem know that her MRI looked ok with no sign of aneurysm.

## 2012-01-11 ENCOUNTER — Ambulatory Visit (INDEPENDENT_AMBULATORY_CARE_PROVIDER_SITE_OTHER): Payer: BC Managed Care – PPO | Admitting: Family Medicine

## 2012-01-11 ENCOUNTER — Encounter: Payer: Self-pay | Admitting: Family Medicine

## 2012-01-11 VITALS — BP 144/90 | HR 82 | Temp 98.8°F | Ht 65.75 in | Wt 223.0 lb

## 2012-01-11 DIAGNOSIS — R0609 Other forms of dyspnea: Secondary | ICD-10-CM

## 2012-01-11 DIAGNOSIS — R5383 Other fatigue: Secondary | ICD-10-CM

## 2012-01-11 DIAGNOSIS — M722 Plantar fascial fibromatosis: Secondary | ICD-10-CM | POA: Insufficient documentation

## 2012-01-11 DIAGNOSIS — R51 Headache: Secondary | ICD-10-CM

## 2012-01-11 DIAGNOSIS — J029 Acute pharyngitis, unspecified: Secondary | ICD-10-CM | POA: Insufficient documentation

## 2012-01-11 DIAGNOSIS — G43909 Migraine, unspecified, not intractable, without status migrainosus: Secondary | ICD-10-CM

## 2012-01-11 DIAGNOSIS — G4700 Insomnia, unspecified: Secondary | ICD-10-CM

## 2012-01-11 DIAGNOSIS — R0683 Snoring: Secondary | ICD-10-CM

## 2012-01-11 DIAGNOSIS — R5381 Other malaise: Secondary | ICD-10-CM

## 2012-01-11 DIAGNOSIS — R030 Elevated blood-pressure reading, without diagnosis of hypertension: Secondary | ICD-10-CM

## 2012-01-11 HISTORY — DX: Plantar fascial fibromatosis: M72.2

## 2012-01-11 MED ORDER — PROPRANOLOL HCL 10 MG PO TABS: 10.0000 mg | ORAL_TABLET | Freq: Two times a day (BID) | ORAL | Status: DC

## 2012-01-11 MED ORDER — AMOXICILLIN-POT CLAVULANATE 875-125 MG PO TABS: 1.0000 | ORAL_TABLET | Freq: Two times a day (BID) | ORAL | Status: AC

## 2012-01-11 MED ORDER — METHYLPREDNISOLONE 4 MG PO KIT: PACK | ORAL | Status: AC

## 2012-01-11 NOTE — Assessment & Plan Note (Signed)
Multifactorial but wakes frequently, snores, has HA and elevated BP and enlarged tonsils, suspicious for apnea, agrees to inhome sleep study for further evaluation

## 2012-01-11 NOTE — Assessment & Plan Note (Signed)
Persistent and patient struggling with HA as well, will start Inderal 10 mg po bid and see her back in 1 month or as needed, encourged to try and follow the DASH diet with decreased sodium intake

## 2012-01-11 NOTE — Progress Notes (Signed)
Patient ID: Erika Weaver, female   DOB: [birth year], 36 y.o.   MRN: [id number] Erika Weaver [id number] [birth year] 01/11/2012      Progress Note-Follow Up  Subjective  Chief Complaint  Chief Complaint  Patient presents with  . URI    HA, congestion, post nasal drip, ST x 1 week    HPI  This is a 36-year-old Caucasian female who is in with multiple concerns. She is a long history of pharyngitis and enlarged tonsils due to childhood. This week she developed a sore throat with swelling, facial and nasal congestion. Postnasal drip mild cough and headache. She works in a pediatric office and recheck her for strep earlier in the week and that was negative but unfortunately her symptoms continue to worsen no obvious fevers or chills at malaise and anorexia are noted. She's been seen by neurology and no concerning abnormalities are found she continues to have intermittent almost daily low grade headaches and a sense of lightheadedness. She's been encouraged to increase her hydration and has only been partially successful. She struggled with bilateral plantar fasciitis and had some injections yesterday tried [facility]. No obvious relief yet. She is under a great deal of stress as well and acknowledges that is contributing to her symptoms. On review she is still with fatigue, she is restless sleep and sometimes wakes herself with her snoring. His snoring worse than she used to as well  Past Medical History  Diagnosis Date  . Chicken pox as a child  . Overweight   . Headache 11/22/2011  . Allergic state 11/22/2011  . Visual changes 11/22/2011  . Elevated BP 11/22/2011  . Anxiety 11/22/2011  . Sinusitis 11/22/2011  . Plantar fasciitis 01/11/2012  . Fatigue 01/11/2012  . Pharyngitis 01/11/2012    Past Surgical History  Procedure Date  . Gum surgery   . Wisdom tooth extraction   . Perineal body repair     4 th degree tear with fistula between vagina and retum requiring 3 surgeries for  correction    Family History  Problem Relation Age of Onset  . Diabetes Mother     type 2  . Hypertension Mother   . Hyperlipidemia Mother   . Hypertension Father   . Hyperlipidemia Father   . Cancer Maternal Grandmother 24    colon  . Other Maternal Grandfather 41    brain hemorrhage  . Stroke Maternal Grandfather   . Aneurysm Maternal Grandfather   . Heart attack Paternal Grandmother   . Heart disease Paternal Grandmother     MI  . Alzheimer's disease Paternal Grandfather   . Dementia Paternal Grandfather   . Heart attack Paternal Aunt   . Heart disease Paternal Aunt     MI  . Other Paternal Uncle     cerebreal brain hemorrhage  . Stroke Paternal Uncle   . Aneurysm Paternal Uncle   . Asthma Daughter   . Other Daughter     peanut allergy  . Aneurysm Maternal Uncle 60    coiled, behind eye    History   Social History  . Marital Status: Married    Spouse Name: N/A    Number of Children: N/A  . Years of Education: N/A   Occupational History  . Not on file.   Social History Main Topics  . Smoking status: Never Smoker   . Smokeless tobacco: Never Used  . Alcohol Use: No  . Drug Use: No  . Sexually Active: Yes -- Female partner(s)  Other Topics Concern  . Not on file   Social History Narrative  . No narrative on file    Current Outpatient Prescriptions on File Prior to Visit  Medication Sig Dispense Refill  . Prenatal Vit-Fe Fumarate-FA (PRENATAL MULTIVITAMIN) TABS Take 1 tablet by mouth daily.      . bifidobacterium infantis (ALIGN) capsule Take 1 capsule by mouth daily.  30 capsule  0  . guaiFENesin (MUCINEX) 600 MG 12 hr tablet Take 1 tablet (600 mg total) by mouth 2 (two) times daily.  28 tablet  0  . loratadine (CLARITIN) 10 MG tablet Take 1 tablet (10 mg total) by mouth daily.  30 tablet  11  . Saline 0.65 % (SOLN) SOLN Place 1 spray into the nose 2 (two) times daily.  1 Bottle  0    No Known Allergies  Review of Systems  Review of Systems    Constitutional: Positive for malaise/fatigue. Negative for fever.  HENT: Positive for congestion and sore throat.   Eyes: Negative for discharge.  Respiratory: Positive for cough and sputum production. Negative for shortness of breath.   Cardiovascular: Negative for chest pain, palpitations and leg swelling.  Gastrointestinal: Negative for nausea, abdominal pain and diarrhea.  Genitourinary: Negative for dysuria.  Musculoskeletal: Positive for joint pain. Negative for falls.       B/l heel pain  Skin: Negative for rash.  Neurological: Positive for headaches. Negative for loss of consciousness.  Endo/Heme/Allergies: Negative for polydipsia.  Psychiatric/Behavioral: Negative for depression and suicidal ideas. The patient is nervous/anxious and has insomnia.     Objective  BP 144/90  Pulse 82  Temp(Src) 98.8 F (37.1 C) (Temporal)  Ht 5' 5.75" (1.67 m)  Wt 223 lb (101.152 kg)  BMI 36.27 kg/m2  SpO2 98%  Physical Exam  Physical Exam  Constitutional: She is oriented to person, place, and time and well-developed, well-nourished, and in no distress. No distress.  HENT:  Head: Normocephalic and atraumatic.  Right Ear: External ear normal.  Left Ear: External ear normal.  Nose: Nose normal.       Tonsils 2-3 + b/l, erythematous, TMs mildly erythematous  Eyes: Conjunctivae are normal. Pupils are equal, round, and reactive to light. Right eye exhibits no discharge. Left eye exhibits no discharge. No scleral icterus.  Neck: Normal range of motion. Neck supple. No thyromegaly present.  Cardiovascular: Normal rate, regular rhythm, normal heart sounds and intact distal pulses.   No murmur heard. Pulmonary/Chest: Effort normal and breath sounds normal. No respiratory distress. She has no wheezes. She has no rales.  Abdominal: Soft. Bowel sounds are normal. She exhibits no distension and no mass. There is no tenderness.  Musculoskeletal: Normal range of motion. She exhibits no edema and no  tenderness.  Lymphadenopathy:    She has cervical adenopathy.  Neurological: She is alert and oriented to person, place, and time. She has normal reflexes. No cranial nerve deficit. Coordination normal.  Skin: Skin is warm and dry. No rash noted. She is not diaphoretic.  Psychiatric: Mood, memory and affect normal.    Lab Results  Component Value Date   TSH 0.51 11/22/2011   Lab Results  Component Value Date   WBC 10.4 11/22/2011   HGB 14.0 11/22/2011   HCT 40.8 11/22/2011   MCV 92.7 11/22/2011   PLT 285.0 11/22/2011   Lab Results  Component Value Date   CREATININE 0.7 11/22/2011   BUN 13 11/22/2011   NA 139 11/22/2011   K 3.6 11/22/2011  CL 104 11/22/2011   CO2 28 11/22/2011   Lab Results  Component Value Date   ALT 25 11/22/2011   AST 17 11/22/2011   ALKPHOS 74 11/22/2011   BILITOT 0.2* 11/22/2011   Lab Results  Component Value Date   CHOL 200 11/22/2011   Lab Results  Component Value Date   HDL 42.40 11/22/2011   Lab Results  Component Value Date   LDLCALC 127* 11/22/2011   Lab Results  Component Value Date   TRIG 153.0* 11/22/2011   Lab Results  Component Value Date   CHOLHDL 5 11/22/2011     Assessment & Plan  Elevated BP Persistent and patient struggling with HA as well, will start Inderal 10 mg po bid and see her back in 1 month or as needed, encourged to try and follow the DASH diet with decreased sodium intake  Headache Has been seen by neurology and reassured that all looks well. She is encouraged to increase her hydration as well and Inderal may be helpful although she does not have any associated symptoms with her HA  Plantar fasciitis Was seen by [facility] and had shots in b/l heels yesterday, encouraged to continue stretching, icing and wearing good foot wear  Pharyngitis Works at a pediatrician's office and had a rapid strep culture earlier in the week but unfortunately her symptoms continue to worsen. She is given an rx for antibiotic and  steroid today and we will see her back next month, she has had recurrent trouble and enlarged tonsils for a long time and is developing worsening snoring and fatigue will consider referral to ENT at next visit after she has been treated for acute infection  Fatigue Multifactorial but wakes frequently, snores, has HA and elevated BP and enlarged tonsils, suspicious for apnea, agrees to inhome sleep study for further evaluation

## 2012-01-11 NOTE — Assessment & Plan Note (Signed)
Was seen by [facility] and had shots in b/l heels yesterday, encouraged to continue stretching, icing and wearing good foot wear

## 2012-01-11 NOTE — Patient Instructions (Signed)

## 2012-01-11 NOTE — Assessment & Plan Note (Signed)
Has been seen by neurology and reassured that all looks well. She is encouraged to increase her hydration as well and Inderal may be helpful although she does not have any associated symptoms with her HA

## 2012-01-11 NOTE — Assessment & Plan Note (Signed)
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 She is given an rx for antibiotic and steroid today and we will see her back next month, she has had recurrent trouble and enlarged tonsils for a long time and is developing worsening snoring and fatigue will consider referral to ENT at next visit after she has been treated for acute infection

## 2012-02-15 ENCOUNTER — Ambulatory Visit: Payer: BC Managed Care – PPO | Admitting: Family Medicine

## 2012-02-21 ENCOUNTER — Ambulatory Visit: Payer: BC Managed Care – PPO | Admitting: Family Medicine

## 2012-02-26 ENCOUNTER — Ambulatory Visit: Payer: BC Managed Care – PPO | Admitting: Neurology

## 2012-03-06 ENCOUNTER — Telehealth: Payer: Self-pay

## 2012-03-06 NOTE — Telephone Encounter (Signed)
Patient left a message stating that [facility] w/ [employer] called her and told her she had sleep apnea. Per patient her husband switched jobs and won't have insurance until June 1st. Patient  Is wanting to make sure this is okay to wait? Patient also stated MD has told her her tonsils are very large?  Please advise that it's ok to wait until June 1st? Patient also rescheduled her appt with MD until June 3rd.

## 2012-03-09 NOTE — Telephone Encounter (Signed)
So she should not drink alcohol or take any sleep aides until she is treated. There is some risk from not treating but it if understandable that she wants to wait. Most of the risk from sleep apnea being untreated is long term.

## 2012-03-10 NOTE — Telephone Encounter (Signed)
Left a detailed message on patients cell phone

## 2012-03-11 ENCOUNTER — Encounter: Payer: Self-pay | Admitting: Family Medicine

## 2012-03-24 ENCOUNTER — Ambulatory Visit: Payer: BC Managed Care – PPO | Admitting: Family Medicine

## 2012-04-03 ENCOUNTER — Ambulatory Visit (INDEPENDENT_AMBULATORY_CARE_PROVIDER_SITE_OTHER): Payer: BC Managed Care – PPO | Admitting: Family Medicine

## 2012-04-03 ENCOUNTER — Encounter: Payer: Self-pay | Admitting: Family Medicine

## 2012-04-03 ENCOUNTER — Other Ambulatory Visit: Payer: BC Managed Care – PPO

## 2012-04-03 VITALS — BP 137/87 | HR 76 | Temp 98.0°F | Ht 65.75 in | Wt 223.0 lb

## 2012-04-03 DIAGNOSIS — E663 Overweight: Secondary | ICD-10-CM

## 2012-04-03 DIAGNOSIS — M722 Plantar fascial fibromatosis: Secondary | ICD-10-CM

## 2012-04-03 DIAGNOSIS — R5383 Other fatigue: Secondary | ICD-10-CM

## 2012-04-03 DIAGNOSIS — R030 Elevated blood-pressure reading, without diagnosis of hypertension: Secondary | ICD-10-CM

## 2012-04-03 DIAGNOSIS — I10 Essential (primary) hypertension: Secondary | ICD-10-CM

## 2012-04-03 DIAGNOSIS — G4730 Sleep apnea, unspecified: Secondary | ICD-10-CM

## 2012-04-03 HISTORY — DX: Sleep apnea, unspecified: G47.30

## 2012-04-03 MED ORDER — METOPROLOL SUCCINATE ER 25 MG PO TB24: 25.0000 mg | ORAL_TABLET | Freq: Every day | ORAL | Status: DC

## 2012-04-03 NOTE — Progress Notes (Signed)
Patient ID: Erika Weaver, female   DOB: 1976/08/27, 36 y.o.   MRN: [id number] Erika Weaver [id number] [birth year] 04/03/2012      Progress Note-Follow Up  Subjective  Chief Complaint  Chief Complaint  Patient presents with  . Follow-up    1 month    HPI  Patient is a 36 year old Caucasian female who is in today for followup. She finished her sleep study and has been found to have apnea. His weight machine. Continues to struggle with fatigue. She has a several days late on her period so we do run a pregnancy test today is negative. She's had no other recent illness, fevers, chills, chest pain, palpitations, GI or GU complaints.  Past Medical History  Diagnosis Date  . Chicken pox as a child  . Overweight   . Headache 11/22/2011  . Allergic state 11/22/2011  . Visual changes 11/22/2011  . Elevated BP 11/22/2011  . Anxiety 11/22/2011  . Sinusitis 11/22/2011  . Plantar fasciitis 01/11/2012  . Fatigue 01/11/2012  . Pharyngitis 01/11/2012  . Sleep apnea 04/03/2012    Past Surgical History  Procedure Date  . Gum surgery   . Wisdom tooth extraction   . Perineal body repair     4 th degree tear with fistula between vagina and retum requiring 3 surgeries for correction    Family History  Problem Relation Age of Onset  . Diabetes Mother     type 2  . Hypertension Mother   . Hyperlipidemia Mother   . Hypertension Father   . Hyperlipidemia Father   . Cancer Maternal Grandmother 17    colon  . Other Maternal Grandfather 41    brain hemorrhage  . Stroke Maternal Grandfather   . Aneurysm Maternal Grandfather   . Heart attack Paternal Grandmother   . Heart disease Paternal Grandmother     MI  . Alzheimer's disease Paternal Grandfather   . Dementia Paternal Grandfather   . Heart attack Paternal Aunt   . Heart disease Paternal Aunt     MI  . Other Paternal Uncle     cerebreal brain hemorrhage  . Stroke Paternal Uncle   . Aneurysm Paternal Uncle   . Asthma Daughter   .  Other Daughter     peanut allergy  . Aneurysm Maternal Uncle 60    coiled, behind eye    History   Social History  . Marital Status: Married    Spouse Name: N/A    Number of Children: N/A  . Years of Education: N/A   Occupational History  . Not on file.   Social History Main Topics  . Smoking status: Never Smoker   . Smokeless tobacco: Never Used  . Alcohol Use: No  . Drug Use: No  . Sexually Active: Yes -- Female partner(s)   Other Topics Concern  . Not on file   Social History Narrative  . No narrative on file    Current Outpatient Prescriptions on File Prior to Visit  Medication Sig Dispense Refill  . loratadine (CLARITIN) 10 MG tablet Take 1 tablet (10 mg total) by mouth daily.  30 tablet  11  . Prenatal Vit-Fe Fumarate-FA (PRENATAL MULTIVITAMIN) TABS Take 1 tablet by mouth daily.      . bifidobacterium infantis (ALIGN) capsule Take 1 capsule by mouth daily.  30 capsule  0  . metoprolol succinate (TOPROL-XL) 25 MG 24 hr tablet Take 1 tablet (25 mg total) by mouth daily.  30 tablet  5  No Known Allergies  Review of Systems  Review of Systems  Constitutional: Positive for malaise/fatigue. Negative for fever.  HENT: Negative for congestion.   Eyes: Negative for discharge.  Respiratory: Negative for shortness of breath.   Cardiovascular: Negative for chest pain, palpitations and leg swelling.  Gastrointestinal: Negative for nausea, abdominal pain and diarrhea.  Genitourinary: Negative for dysuria.  Musculoskeletal: Negative for falls.  Skin: Negative for rash.  Neurological: Positive for headaches. Negative for loss of consciousness.  Endo/Heme/Allergies: Negative for polydipsia.  Psychiatric/Behavioral: Negative for depression and suicidal ideas. The patient is not nervous/anxious and does not have insomnia.     Objective  BP 137/87  Pulse 76  Temp 98 F (36.7 C) (Temporal)  Ht 5' 5.75" (1.67 m)  Wt 223 lb (101.152 kg)  BMI 36.27 kg/m2  SpO2 98%   LMP 02/24/2012  Physical Exam  Physical Exam  Constitutional: She is oriented to person, place, and time and well-developed, well-nourished, and in no distress. No distress.  HENT:  Head: Normocephalic and atraumatic.  Eyes: Conjunctivae are normal.  Neck: Neck supple. No thyromegaly present.  Cardiovascular: Normal rate, regular rhythm and normal heart sounds.   No murmur heard. Pulmonary/Chest: Effort normal and breath sounds normal. She has no wheezes.  Abdominal: She exhibits no distension and no mass.  Musculoskeletal: She exhibits no edema.  Lymphadenopathy:    She has no cervical adenopathy.  Neurological: She is alert and oriented to person, place, and time.  Skin: Skin is warm and dry. No rash noted. She is not diaphoretic.  Psychiatric: Memory, affect and judgment normal.    Lab Results  Component Value Date   TSH 0.51 11/22/2011   Lab Results  Component Value Date   WBC 10.4 11/22/2011   HGB 14.0 11/22/2011   HCT 40.8 11/22/2011   MCV 92.7 11/22/2011   PLT 285.0 11/22/2011   Lab Results  Component Value Date   CREATININE 0.7 11/22/2011   BUN 13 11/22/2011   NA 139 11/22/2011   K 3.6 11/22/2011   CL 104 11/22/2011   CO2 28 11/22/2011   Lab Results  Component Value Date   ALT 25 11/22/2011   AST 17 11/22/2011   ALKPHOS 74 11/22/2011   BILITOT 0.2* 11/22/2011   Lab Results  Component Value Date   CHOL 200 11/22/2011   Lab Results  Component Value Date   HDL 42.40 11/22/2011   Lab Results  Component Value Date   LDLCALC 127* 11/22/2011   Lab Results  Component Value Date   TRIG 153.0* 11/22/2011   Lab Results  Component Value Date   CHOLHDL 5 11/22/2011     Assessment & Plan Overweight Encouraged DASH diet and increased exercise.  Sleep apnea Patient has tested positive for sleep apnea, will have her start her CPAP machine  Plantar fasciitis Ice and aspercreme bid and stretch. Better shoes are also recommended  Fatigue Multifactorial and due to  stress and sleep apnea will treat apnea and reassess  Elevated BP Keeps forgetting to take the Propranolol the second dose of the day, will switch to Metoprolol XL 25 mg daily

## 2012-04-03 NOTE — Assessment & Plan Note (Signed)
Keeps forgetting to take the Propranolol the second dose of the day, will switch to Metoprolol XL 25 mg daily

## 2012-04-03 NOTE — Patient Instructions (Addendum)
Sleep Apnea Sleep apnea is a common disorder. The main problem of this disorder is excessive daytime sleepiness and compromised quality of life. This may include social and emotional problems. There are two types of sleep apnea.  Obstructive sleep apnea is when breathing stops due to a blocked airway.   Central sleep apnea is a malfunction of the brain's normal signal to breathe.  SYMPTOMS  Restless sleep.   Falling asleep while driving and/or during the day.   Loss of energy.   Irritability.   Mood or behavior changes.   Loud, heavy snoring.   Morning headaches.   Trouble concentrating.   Forgetfulness.   Anxiety or depression.   Decreased interest in sex.  Not all people with sleep apnea have all of these symptoms. However, people who have a few of these symptoms should visit their caregiver for an evaluation. Problems related to untreated sleep apnea include:  High blood pressure (hypertension).   Coronary artery disease.   Impotence.   Cognitive dysfunction.   Memory loss.  TREATMENT  For mild cases, treatment may include avoiding sleeping on one's back.   For people with nasal congestion, a decongestant may be prescribed.   Patients with obstructive and central apnea should avoid depressants. This includes alcohol, sedatives and narcotics. Weight loss and diet control are encouraged for overweight patients.   Many serious cases of obstructive sleep apnea can be relieved by a treatment called nasal continuous positive airway pressure (nasal CPAP). Nasal CPAP uses a mask-like device and pump that work together to keep the airway open. The pump delivers air pressure during each breath.   Surgery may help some patients by stopping or reducing the narrowing of the airway due to anatomical defects.  PROGNOSIS  Removing the obstruction usually reverses hypertension and cardiac problems. Untreated, sleep apnea sufferers have a tendency to fall asleep during the day.  This is can result in serious accident or loss of ones job. RESEARCH Sleep apnea is currently one of the most active areas of sleep research.  Document Released: 09/28/2002 Document Revised: 09/27/2011 Document Reviewed: 01/24/2006 [employer] Patient Information 2012 [employer]. 

## 2012-04-03 NOTE — Assessment & Plan Note (Signed)
Encouraged DASH diet and increased exercise 

## 2012-04-03 NOTE — Assessment & Plan Note (Signed)
Patient has tested positive for sleep apnea, will have her start her CPAP machine

## 2012-04-03 NOTE — Assessment & Plan Note (Signed)
Ice and aspercreme bid and stretch. Better shoes are also recommended

## 2012-04-03 NOTE — Assessment & Plan Note (Signed)
Multifactorial and due to stress and sleep apnea will treat apnea and reassess

## 2012-04-04 NOTE — Progress Notes (Signed)
Patient ID: Erika Weaver, female   DOB: 1976-04-09, 36 y.o.   MRN: [id number] Duplicate note created in error

## 2012-04-30 ENCOUNTER — Ambulatory Visit: Payer: BC Managed Care – PPO | Admitting: Neurology

## 2012-05-27 ENCOUNTER — Ambulatory Visit (INDEPENDENT_AMBULATORY_CARE_PROVIDER_SITE_OTHER): Payer: BC Managed Care – PPO | Admitting: Neurology

## 2012-05-27 ENCOUNTER — Encounter: Payer: Self-pay | Admitting: Neurology

## 2012-05-27 VITALS — BP 120/84 | HR 72 | Wt 222.0 lb

## 2012-05-27 DIAGNOSIS — R51 Headache: Secondary | ICD-10-CM

## 2012-05-27 NOTE — Progress Notes (Signed)
Dear Dr. Abner Greenspan,  I saw  Erika Weaver back in [facility] for her problem with headaches and lightheadedness in the setting of a family history of aneurysms. At her first appointment she had had an intermittent worsening of headaches that made her worried about possible cerebral aneurysm.  While I felt this was an unlikely cause, I did think that with the new onset of headaches she deserved imaging.  While because of insurance reasons we could not get an MRA, an MRI brain with and without contrast did not demonstrate any aneurysm and I think was a suitable screen in this instance.  She has had complete remission of her headaches.  Her dizziness is better.  She has been diagnosed with OSA and is on CPAP.    Medical history, social history, and family history were reviewed and have not changed since the last clinic visit.  Current Outpatient Prescriptions on File Prior to Visit  Medication Sig Dispense Refill  . metoprolol succinate (TOPROL-XL) 25 MG 24 hr tablet Take 1 tablet (25 mg total) by mouth daily.  30 tablet  5  . Prenatal Vit-Fe Fumarate-FA (PRENATAL MULTIVITAMIN) TABS Take 1 tablet by mouth daily.      . bifidobacterium infantis (ALIGN) capsule Take 1 capsule by mouth daily.  30 capsule  0  . loratadine (CLARITIN) 10 MG tablet Take 1 tablet (10 mg total) by mouth daily.  30 tablet  11    No Known Allergies  ROS:  13 systems were reviewed and are unremarkable.  Exam: . Filed Vitals:   05/27/12 1101  BP: 120/84  Pulse: 72  Weight: 222 lb (100.699 kg)   In general, well appearing women.   Impression/Recommendations:  1.  Headaches - likely tension type, resolved. 2. Family history(not first degree) of cerebral aneurysm - MRI brain with and without was negative for aneurysm.  The patient does not need to see me in follow up.  Lupita Raider Modesto Charon, MD [facility], [place]

## 2012-06-12 ENCOUNTER — Ambulatory Visit (INDEPENDENT_AMBULATORY_CARE_PROVIDER_SITE_OTHER): Payer: BC Managed Care – PPO | Admitting: Family Medicine

## 2012-06-12 ENCOUNTER — Encounter: Payer: Self-pay | Admitting: Family Medicine

## 2012-06-12 VITALS — BP 141/88 | HR 70 | Temp 97.6°F | Ht 61.0 in | Wt 224.4 lb

## 2012-06-12 DIAGNOSIS — R030 Elevated blood-pressure reading, without diagnosis of hypertension: Secondary | ICD-10-CM

## 2012-06-12 DIAGNOSIS — R51 Headache: Secondary | ICD-10-CM

## 2012-06-12 DIAGNOSIS — J069 Acute upper respiratory infection, unspecified: Secondary | ICD-10-CM

## 2012-06-12 DIAGNOSIS — G4700 Insomnia, unspecified: Secondary | ICD-10-CM

## 2012-06-12 DIAGNOSIS — G4730 Sleep apnea, unspecified: Secondary | ICD-10-CM

## 2012-06-12 MED ORDER — ALPRAZOLAM 0.25 MG PO TABS: 0.2500 mg | ORAL_TABLET | Freq: Every evening | ORAL | Status: AC | PRN

## 2012-06-12 MED ORDER — GUAIFENESIN ER 600 MG PO TB12: 600.0000 mg | ORAL_TABLET | Freq: Two times a day (BID) | ORAL | Status: DC

## 2012-06-12 NOTE — Patient Instructions (Addendum)

## 2012-06-16 NOTE — Progress Notes (Signed)
Patient ID: Erika Weaver, female   DOB: [birth year], 36 y.o.   MRN: [id number] [name] [id number] 01/27/1976 06/16/2012      Progress Note-Follow Up  Subjective  Chief Complaint  Chief Complaint  Patient presents with  . Labs Only  . sleeping problems    HPI  Patient is a 36 year old Caucasian female who is in today for followup on her CPAP use. She is using her CPAP regularly each day. Uses it for greater than 4 hours. Reports she feels much better. Her fatigue and headaches. Malaise and poor concentration improved. She denies any chest pain, palpitations, shortness of breath, GI or GU complaints. Has had 2 days worth of sinus congestion with a low-grade headache but no fevers, sore throat, ear pain chest congestion.  Past Medical History  Diagnosis Date  . Chicken pox as a child  . Overweight   . Headache 11/22/2011  . Allergic state 11/22/2011  . Visual changes 11/22/2011  . Elevated BP 11/22/2011  . Anxiety 11/22/2011  . Sinusitis 11/22/2011  . Plantar fasciitis 01/11/2012  . Fatigue 01/11/2012  . Pharyngitis 01/11/2012  . Sleep apnea 04/03/2012    Past Surgical History  Procedure Date  . Gum surgery   . Wisdom tooth extraction   . Perineal body repair     4 th degree tear with fistula between vagina and retum requiring 3 surgeries for correction    Family History  Problem Relation Age of Onset  . Diabetes Mother     type 2  . Hypertension Mother   . Hyperlipidemia Mother   . Hypertension Father   . Hyperlipidemia Father   . Aneurysm Father   . Cancer Maternal Grandmother 54    colon  . Other Maternal Grandfather 41    brain hemorrhage  . Stroke Maternal Grandfather   . Aneurysm Maternal Grandfather   . Heart attack Paternal Grandmother   . Heart disease Paternal Grandmother     MI  . Alzheimer's disease Paternal Grandfather   . Dementia Paternal Grandfather   . Heart attack Paternal Aunt   . Heart disease Paternal Aunt     MI  . Other Paternal  Uncle     cerebreal brain hemorrhage  . Stroke Paternal Uncle   . Aneurysm Paternal Uncle   . Asthma Daughter   . Other Daughter     peanut allergy  . Aneurysm Maternal Uncle 60    coiled, behind eye    History   Social History  . Marital Status: Married    Spouse Name: N/A    Number of Children: N/A  . Years of Education: N/A   Occupational History  . Not on file.   Social History Main Topics  . Smoking status: Never Smoker   . Smokeless tobacco: Never Used  . Alcohol Use: No  . Drug Use: No  . Sexually Active: Yes -- Female partner(s)   Other Topics Concern  . Not on file   Social History Narrative  . No narrative on file    Current Outpatient Prescriptions on File Prior to Visit  Medication Sig Dispense Refill  . bifidobacterium infantis (ALIGN) capsule Take 1 capsule by mouth daily.  30 capsule  0  . metoprolol succinate (TOPROL-XL) 25 MG 24 hr tablet Take 1 tablet (25 mg total) by mouth daily.  30 tablet  5    No Known Allergies  Review of Systems  Review of Systems  Constitutional: Negative for fever and malaise/fatigue.  HENT:  Positive for congestion.   Eyes: Negative for discharge.  Respiratory: Negative for shortness of breath.   Cardiovascular: Negative for chest pain, palpitations and leg swelling.  Gastrointestinal: Negative for nausea, abdominal pain and diarrhea.  Genitourinary: Negative for dysuria.  Musculoskeletal: Negative for falls.  Skin: Negative for rash.  Neurological: Positive for headaches. Negative for loss of consciousness.  Endo/Heme/Allergies: Negative for polydipsia.  Psychiatric/Behavioral: Negative for depression and suicidal ideas. The patient is not nervous/anxious and does not have insomnia.     Objective  BP 141/88  Pulse 70  Temp 97.6 F (36.4 C) (Temporal)  Ht 5\' 1"  (1.549 m)  Wt 224 lb 6.4 oz (101.787 kg)  BMI 42.40 kg/m2  SpO2 99%  LMP 06/11/2012  Physical Exam  Physical Exam  Constitutional: She is  oriented to person, place, and time and well-developed, well-nourished, and in no distress. No distress.  HENT:  Head: Normocephalic and atraumatic.  Eyes: Conjunctivae are normal.  Neck: Neck supple. No thyromegaly present.  Cardiovascular: Normal rate, regular rhythm and normal heart sounds.   No murmur heard. Pulmonary/Chest: Effort normal and breath sounds normal. She has no wheezes.  Abdominal: She exhibits no distension and no mass.  Musculoskeletal: She exhibits no edema.  Lymphadenopathy:    She has no cervical adenopathy.  Neurological: She is alert and oriented to person, place, and time.  Skin: Skin is warm and dry. No rash noted. She is not diaphoretic.  Psychiatric: Memory, affect and judgment normal.    Lab Results  Component Value Date   TSH 0.51 11/22/2011   Lab Results  Component Value Date   WBC 10.4 11/22/2011   HGB 14.0 11/22/2011   HCT 40.8 11/22/2011   MCV 92.7 11/22/2011   PLT 285.0 11/22/2011   Lab Results  Component Value Date   CREATININE 0.7 11/22/2011   BUN 13 11/22/2011   NA 139 11/22/2011   K 3.6 11/22/2011   CL 104 11/22/2011   CO2 28 11/22/2011   Lab Results  Component Value Date   ALT 25 11/22/2011   AST 17 11/22/2011   ALKPHOS 74 11/22/2011   BILITOT 0.2* 11/22/2011   Lab Results  Component Value Date   CHOL 200 11/22/2011   Lab Results  Component Value Date   HDL 42.40 11/22/2011   Lab Results  Component Value Date   LDLCALC 127* 11/22/2011   Lab Results  Component Value Date   TRIG 153.0* 11/22/2011   Lab Results  Component Value Date   CHOLHDL 5 11/22/2011     Assessment & Plan  Sleep apnea Feels much better with CPAP use, is using it every night for greater than 4 hours. Is concentrating better and feels less tired  Headache Has had some congestion with headaches for past few days but does not have frequent headaches recently, has been seen by neurology and imaging has ruled out aneurysm, does have a FH of a father with a brain  aneurysm  Elevated BP Adequate control at today's visit. Minimize sodium and caffeine

## 2012-06-16 NOTE — Assessment & Plan Note (Signed)
Feels much better with CPAP use, is using it every night for greater than 4 hours. Is concentrating better and feels less tired

## 2012-06-16 NOTE — Assessment & Plan Note (Signed)
Has had some congestion with headaches for past few days but does not have frequent headaches recently, has been seen by neurology and imaging has ruled out aneurysm, does have a FH of a father with a brain aneurysm

## 2012-06-16 NOTE — Assessment & Plan Note (Signed)
Adequate control at today's visit. Minimize sodium and caffeine

## 2012-07-01 ENCOUNTER — Encounter (HOSPITAL_BASED_OUTPATIENT_CLINIC_OR_DEPARTMENT_OTHER): Payer: Self-pay

## 2012-07-01 ENCOUNTER — Emergency Department (HOSPITAL_BASED_OUTPATIENT_CLINIC_OR_DEPARTMENT_OTHER): Payer: BC Managed Care – PPO

## 2012-07-01 ENCOUNTER — Emergency Department (HOSPITAL_BASED_OUTPATIENT_CLINIC_OR_DEPARTMENT_OTHER)
Admission: EM | Admit: 2012-07-01 | Discharge: 2012-07-02 | Disposition: A | Discharge status: Home / Self Care | Payer: BC Managed Care – PPO | Attending: Emergency Medicine | Admitting: Emergency Medicine

## 2012-07-01 DIAGNOSIS — R109 Unspecified abdominal pain: Secondary | ICD-10-CM | POA: Insufficient documentation

## 2012-07-01 DIAGNOSIS — G4730 Sleep apnea, unspecified: Secondary | ICD-10-CM | POA: Insufficient documentation

## 2012-07-01 DIAGNOSIS — N200 Calculus of kidney: Secondary | ICD-10-CM | POA: Insufficient documentation

## 2012-07-01 LAB — URINE MICROSCOPIC-ADD ON

## 2012-07-01 LAB — URINALYSIS, ROUTINE W REFLEX MICROSCOPIC
Bilirubin Urine: NEGATIVE
Glucose, UA: NEGATIVE mg/dL
Ketones, ur: 15 mg/dL — AB
Nitrite: NEGATIVE
Specific Gravity, Urine: 1.024 (ref 1.005–1.030)
pH: 6.5 (ref 5.0–8.0)

## 2012-07-01 LAB — CBC WITH DIFFERENTIAL/PLATELET
Basophils Absolute: 0 10*3/uL (ref 0.0–0.1)
Basophils Relative: 0 % (ref 0–1)
Eosinophils Absolute: 0.2 10*3/uL (ref 0.0–0.7)
Lymphs Abs: 2.2 10*3/uL (ref 0.7–4.0)
MCH: 31 pg (ref 26.0–34.0)
MCHC: 35 g/dL (ref 30.0–36.0)
Neutrophils Relative %: 83 % — ABNORMAL HIGH (ref 43–77)
Platelets: 254 10*3/uL (ref 150–400)
RBC: 4.51 MIL/uL (ref 3.87–5.11)

## 2012-07-01 MED ORDER — SODIUM CHLORIDE 0.9 % IV BOLUS (SEPSIS)
1000.0000 mL | Freq: Once | INTRAVENOUS | Status: AC
  Administered 2012-07-01: 1000 mL via INTRAVENOUS

## 2012-07-01 MED ORDER — HYDROMORPHONE HCL PF 1 MG/ML IJ SOLN
1.0000 mg | Freq: Once | INTRAMUSCULAR | Status: AC
  Administered 2012-07-01: 1 mg via INTRAVENOUS
  Filled 2012-07-01: qty 1

## 2012-07-01 MED ORDER — ONDANSETRON HCL 4 MG/2ML IJ SOLN
4.0000 mg | Freq: Once | INTRAMUSCULAR | Status: AC
  Administered 2012-07-01: 4 mg via INTRAVENOUS
  Filled 2012-07-01: qty 2

## 2012-07-01 NOTE — ED Notes (Signed)
C/o abd, left flank, lower back pain, vomiting since 8pm

## 2012-07-02 LAB — BASIC METABOLIC PANEL
GFR calc Af Amer: >90 mL/min (ref >90)
GFR calc non Af Amer: 81 mL/min — ABNORMAL LOW (ref >90)
Potassium: 3.8 mEq/L (ref 3.5–5.1)
Sodium: 138 mEq/L (ref 135–145)

## 2012-07-02 MED ORDER — IBUPROFEN 800 MG PO TABS: 800.0000 mg | ORAL_TABLET | Freq: Three times a day (TID) | ORAL | Status: AC

## 2012-07-02 MED ORDER — ONDANSETRON 8 MG PO TBDP
ORAL_TABLET | ORAL | Status: AC
  Filled 2012-07-02: qty 1

## 2012-07-02 MED ORDER — ONDANSETRON 8 MG PO TBDP
8.0000 mg | ORAL_TABLET | Freq: Once | ORAL | Status: AC
  Administered 2012-07-02: 8 mg via ORAL

## 2012-07-02 MED ORDER — TAMSULOSIN HCL 0.4 MG PO CAPS: 0.4000 mg | ORAL_CAPSULE | Freq: Every day | ORAL | Status: DC

## 2012-07-02 MED ORDER — OXYCODONE-ACETAMINOPHEN 5-325 MG PO TABS: 1.0000 | ORAL_TABLET | Freq: Four times a day (QID) | ORAL | Status: AC | PRN

## 2012-07-02 MED ORDER — OXYCODONE-ACETAMINOPHEN 5-325 MG PO TABS
1.0000 | ORAL_TABLET | Freq: Once | ORAL | Status: AC
  Administered 2012-07-02: 1 via ORAL
  Filled 2012-07-02: qty 1

## 2012-07-02 MED ORDER — OXYCODONE-ACETAMINOPHEN 5-325 MG PO TABS
ORAL_TABLET | ORAL | Status: AC
  Filled 2012-07-02: qty 1

## 2012-07-02 NOTE — ED Notes (Signed)
Pt vomited x 1 as walking to discharge. Pt given ODT zofran.

## 2012-07-02 NOTE — ED Provider Notes (Signed)
History     CSN: [id number]  Arrival date & time 07/01/12  2240   First MD Initiated Contact with Patient 07/01/12 2336      Chief Complaint  Patient presents with  . Abdominal Pain    (Consider location/radiation/quality/duration/timing/severity/associated sxs/prior treatment) HPI Pt reports sudden onset of severe aching and sharp L flank pain, radiating into abdomen about 3 hours prior to arrival associated with nausea and vomiting. No diarrhea, constipation, fever, dysuria or hematuria. Not currently on her menses.  Past Medical History  Diagnosis Date  . Chicken pox as a child  . Overweight   . Headache 11/22/2011  . Allergic state 11/22/2011  . Visual changes 11/22/2011  . Elevated BP 11/22/2011  . Anxiety 11/22/2011  . Sinusitis 11/22/2011  . Plantar fasciitis 01/11/2012  . Fatigue 01/11/2012  . Pharyngitis 01/11/2012  . Sleep apnea 04/03/2012    Past Surgical History  Procedure Date  . Gum surgery   . Wisdom tooth extraction   . Perineal body repair     4 th degree tear with fistula between vagina and retum requiring 3 surgeries for correction    Family History  Problem Relation Age of Onset  . Diabetes Mother     type 2  . Hypertension Mother   . Hyperlipidemia Mother   . Hypertension Father   . Hyperlipidemia Father   . Aneurysm Father   . Cancer Maternal Grandmother 46    colon  . Other Maternal Grandfather 41    brain hemorrhage  . Stroke Maternal Grandfather   . Aneurysm Maternal Grandfather   . Heart attack Paternal Grandmother   . Heart disease Paternal Grandmother     MI  . Alzheimer's disease Paternal Grandfather   . Dementia Paternal Grandfather   . Heart attack Paternal Aunt   . Heart disease Paternal Aunt     MI  . Other Paternal Uncle     cerebreal brain hemorrhage  . Stroke Paternal Uncle   . Aneurysm Paternal Uncle   . Asthma Daughter   . Other Daughter     peanut allergy  . Aneurysm Maternal Uncle 60    coiled, behind eye     History  Substance Use Topics  . Smoking status: Never Smoker   . Smokeless tobacco: Never Used  . Alcohol Use: No    OB History    Grav Para Term Preterm Abortions TAB SAB Ect Mult Living                  Review of Systems All other systems reviewed and are negative except as noted in HPI.   Allergies  Review of patient's allergies indicates no known allergies.  Home Medications   Current Outpatient Rx  Name Route Sig Dispense Refill  . ALPRAZOLAM 0.25 MG PO TABS Oral Take 1 tablet (0.25 mg total) by mouth at bedtime as needed for sleep. 20 tablet 2  . ALIGN PO CAPS Oral Take 1 capsule by mouth daily. 30 capsule 0  . GUAIFENESIN ER 600 MG PO TB12 Oral Take 1 tablet (600 mg total) by mouth 2 (two) times daily. 20 tablet 0  . METOPROLOL SUCCINATE ER 25 MG PO TB24 Oral Take 1 tablet (25 mg total) by mouth daily. 30 tablet 5    BP 115/59  Pulse 70  Temp [name] (Oral)  Resp 16  Ht 5\' 5"  (1.651 m)  Wt 225 lb (102.059 kg)  BMI 37.44 kg/m2  SpO2 100%  LMP  06/11/2012  Physical Exam  Nursing note and vitals reviewed. Constitutional: She is oriented to person, place, and time. She appears well-developed and well-nourished.  HENT:  Head: Normocephalic and atraumatic.  Eyes: EOM are normal. Pupils are equal, round, and reactive to light.  Neck: Normal range of motion. Neck supple.  Cardiovascular: Normal rate, normal heart sounds and intact distal pulses.   Pulmonary/Chest: Effort normal and breath sounds normal.  Abdominal: Bowel sounds are normal. She exhibits no distension. There is no tenderness.  Musculoskeletal: Normal range of motion. She exhibits no edema and no tenderness.  Neurological: She is alert and oriented to person, place, and time. She has normal strength. No cranial nerve deficit or sensory deficit.  Skin: Skin is warm and dry. No rash noted.  Psychiatric: She has a normal mood and affect.    ED Course  Procedures (including critical  care time)  Labs Reviewed  URINALYSIS, ROUTINE W REFLEX MICROSCOPIC - Abnormal; Notable for the following:    Color, Urine AMBER (*)  BIOCHEMICALS MAY BE AFFECTED BY COLOR   APPearance TURBID (*)     Hgb urine dipstick LARGE (*)     Ketones, ur 15 (*)     Protein, ur 30 (*)     Leukocytes, UA SMALL (*)     All other components within normal limits  URINE MICROSCOPIC-ADD ON - Abnormal; Notable for the following:    Squamous Epithelial / LPF FEW (*)     Bacteria, UA FEW (*)     Crystals CA OXALATE CRYSTALS (*)     All other components within normal limits  CBC WITH DIFFERENTIAL - Abnormal; Notable for the following:    WBC 19.6 (*)     Neutrophils Relative 83 (*)     Neutro Abs 16.2 (*)     Lymphocytes Relative 11 (*)     All other components within normal limits  PREGNANCY, URINE  BASIC METABOLIC PANEL   Ct Abdomen Pelvis Wo Contrast  07/02/2012  *RADIOLOGY REPORT*  Clinical Data: Left flank pain.  Back pain.  No abdominal pain.  CT ABDOMEN AND PELVIS WITHOUT CONTRAST  Technique:  Multidetector CT imaging of the abdomen and pelvis was performed following the standard protocol without intravenous contrast.  Comparison: None.  Findings: The there is a 4 mm stone in the proximal left ureter at the ureteropelvic junction with proximal pyelocaliectasis and periureteral stranding. Changes are consistent with moderate obstruction.  The distal ureter is decompressed.  No additional stones are demonstrated.  The bladder is decompressed.  No pyelocaliectasis or ureterectasis on the right.  The unenhanced appearance of the liver, spleen, gallbladder, pancreas, adrenal glands, abdominal aorta, and retroperitoneal lymph nodes is unremarkable.  The stomach, small bowel, and colon are decompressed.  No free air or free fluid in the abdomen.  Pelvis:  The uterus and adnexal structures are not enlarged. Diverticula in the sigmoid colon without diverticulitis.  The appendix is not visualized. No free or  loculated pelvic fluid collections.  Degenerative disc disease at the lumbosacral interspace.  IMPRESSION: 4 mm stone in the proximal left ureter with moderate obstruction.   Original Report Authenticated By: Marlon Pel, M.D.      No diagnosis found.    MDM  Hematuria with calcium oxalate crystals likely a renal stone, consistent with patient's history. Sent for CT, IVF, pain and nausea meds given and patient is feeling better.        Krystan Northrop B. Bernette Mayers, MD 07/02/12 1610

## 2012-08-08 ENCOUNTER — Other Ambulatory Visit: Payer: Self-pay | Admitting: Urology

## 2012-08-14 ENCOUNTER — Encounter (HOSPITAL_COMMUNITY): Payer: Self-pay | Admitting: Pharmacy Technician

## 2012-08-25 ENCOUNTER — Encounter (HOSPITAL_COMMUNITY): Payer: Self-pay | Admitting: *Deleted

## 2012-08-25 NOTE — Pre-Procedure Instructions (Addendum)
Asked to bring blue folder the day of the procedure,insurance card,I.D. driver's license,wear comfortable clothing and have a driver for the day. Asked not to take Advil,Motrin,Ibuprofen,Aleve or any NSAIDS, Aspirin, or Toradol for 72 hours prior to procedure,  No vitamins or herbal medications 7 days prior to procedure. Instructed to take laxative per doctor's office instructions and eat a light dinner the evening before procedure.   To arrive at 1415 for lithotripsy procedure.  Asked to bring cpap machine for the procedure on Monday.

## 2012-09-01 ENCOUNTER — Ambulatory Visit (HOSPITAL_COMMUNITY)
Admission: RE | Admit: 2012-09-01 | Discharge: 2012-09-01 | Disposition: A | Discharge status: Home / Self Care | Payer: BC Managed Care – PPO | Source: Ambulatory Visit | Attending: Urology | Admitting: Urology

## 2012-09-01 ENCOUNTER — Encounter (HOSPITAL_COMMUNITY): Payer: Self-pay | Admitting: *Deleted

## 2012-09-01 ENCOUNTER — Ambulatory Visit (HOSPITAL_COMMUNITY): Payer: BC Managed Care – PPO

## 2012-09-01 ENCOUNTER — Encounter (HOSPITAL_COMMUNITY)
Admission: RE | Disposition: A | Discharge status: Home / Self Care | Payer: Self-pay | Source: Ambulatory Visit | Attending: Urology

## 2012-09-01 DIAGNOSIS — Z79899 Other long term (current) drug therapy: Secondary | ICD-10-CM | POA: Insufficient documentation

## 2012-09-01 DIAGNOSIS — G4730 Sleep apnea, unspecified: Secondary | ICD-10-CM | POA: Insufficient documentation

## 2012-09-01 DIAGNOSIS — I10 Essential (primary) hypertension: Secondary | ICD-10-CM | POA: Insufficient documentation

## 2012-09-01 DIAGNOSIS — N201 Calculus of ureter: Secondary | ICD-10-CM | POA: Insufficient documentation

## 2012-09-01 HISTORY — DX: Essential (primary) hypertension: I10

## 2012-09-01 HISTORY — DX: Chronic kidney disease, unspecified: N18.9

## 2012-09-01 LAB — PREGNANCY, URINE: Preg Test, Ur: NEGATIVE

## 2012-09-01 SURGERY — LITHOTRIPSY, ESWL
Anesthesia: LOCAL | Laterality: Left

## 2012-09-01 MED ORDER — CIPROFLOXACIN HCL 500 MG PO TABS
500.0000 mg | ORAL_TABLET | ORAL | Status: AC
  Administered 2012-09-01: 500 mg via ORAL
  Filled 2012-09-01: qty 1

## 2012-09-01 MED ORDER — DEXTROSE-NACL 5-0.45 % IV SOLN
INTRAVENOUS | Status: DC
  Administered 2012-09-01: 1000 mL via INTRAVENOUS

## 2012-09-01 MED ORDER — DIPHENHYDRAMINE HCL 25 MG PO CAPS
25.0000 mg | ORAL_CAPSULE | ORAL | Status: AC
  Administered 2012-09-01: 25 mg via ORAL
  Filled 2012-09-01: qty 1

## 2012-09-01 MED ORDER — DIAZEPAM 5 MG PO TABS
10.0000 mg | ORAL_TABLET | ORAL | Status: AC
  Administered 2012-09-01: 10 mg via ORAL
  Filled 2012-09-01: qty 2

## 2012-09-01 NOTE — Interval H&P Note (Signed)
History and Physical Interval Note:  09/01/2012 7:58 AM  Erika Weaver  has presented today for surgery, with the diagnosis of LEFT URETERAL STONE  The various methods of treatment have been discussed with the patient and family. After consideration of risks, benefits and other options for treatment, the patient has consented to  Procedure(s) (LRB) with comments: EXTRACORPOREAL SHOCK WAVE LITHOTRIPSY (ESWL) (Left) as a surgical intervention .  The patient's history has been reviewed, patient examined, no change in status, stable for surgery.  I have reviewed the patient's chart and labs.  Questions were answered to the patient's satisfaction.     Eather Chaires A

## 2012-09-01 NOTE — H&P (Signed)
History of Present Illness   Erika Weaver has a 4 mm stone just below the ureteropelvic junction on the left side. She works for [employer]. She did not pass a stone and she was back today with a repeat KUB.  Review of Systems: No change in bowel or neurologic systems.   Urinalysis: 3-6 red blood cells per high-power field.   KUB: I performed a KUB and in my opinion the stone has not moved or has moved minimally. I reviewed the CT Scan again.    Past Medical History Problems  1. History of  Abdominal Pain 789.00 2. History of  Allergic Rhinitis 477.9 3. History of  Anxiety (Symptom) 300.00 4. History of  Exposed To Chicken Pox V01.71 5. History of  Fatigue 780.79 6. History of  Headache 784.0 7. History of  Hypertension 401.9 8. History of  Morbid Obesity (>100% Overweight) 9. History of  Pharyngitis 462 10. History of  Plantar Fasciitis 728.71 11. History of  Sleep Apnea 780.57  Surgical History Problems  1. History of  Oral Surgery 2. History of  Oral Surgery Tooth Extraction 3. History of  Perineal Gynecological Surgery  Current Meds 1. Align; Therapy: (Recorded:11Sep2013) to 2. ALPRAZolam 0.25 MG Oral Tablet; Therapy: 22Aug2013 to 3. Guaifenesin 600 MG TBCR; Therapy: (Recorded:11Sep2013) to 4. Metoprolol Succinate ER 25 MG Oral Tablet Extended Release 24 Hour; Therapy:  (Recorded:11Sep2013) to 5. Promethazine HCl 25 MG Rectal Suppository; INSERT 1 SUPP Every 8 hours; Therapy:  11Sep2013 to (Last Rx:11Sep2013)  Requested for: 11Sep2013  Allergies No Known Allergies  1. No Known Allergies  Family History Problems  1. Paternal history of  Aneurysm Carotid Artery 2. Maternal grandfather's history of  Aneurysm Of The Abdominal Aorta 3. Maternal grandfather's history of  Brain Cancer V16.8 4. Maternal history of  Diabetes Mellitus V18.0 5. Maternal history of  Hyperlipidemia 6. Paternal history of  Hyperlipidemia 7. Maternal history of  Hypertension  V17.49 8. Paternal history of  Hypertension V17.49  Social History Problems  1. Marital History - Currently Married 2. Never A Smoker 3. Occupation: [employer] Denied  4. History of  Alcohol Use  Results/Data  Urine [Data Includes: Last 1 Day]   18Oct2013  COLOR YELLOW   APPEARANCE CLEAR   SPECIFIC GRAVITY 1.020   pH 6.0   GLUCOSE NEG mg/dL  BILIRUBIN NEG   KETONE NEG mg/dL  BLOOD TRACE   PROTEIN NEG mg/dL  UROBILINOGEN 0.2 mg/dL  NITRITE NEG   LEUKOCYTE ESTERASE NEG   SQUAMOUS EPITHELIAL/HPF MODERATE   WBC 0-2 WBC/hpf  RBC 3-6 RBC/hpf  BACTERIA RARE   CRYSTALS NONE SEEN   CASTS NONE SEEN    Assessment Assessed  1. Nephrolithiasis 592.0  Plan  Nephrolithiasis (592.0)  1. Follow-up Week x 2 Office  Follow-up  Requested for: 18Oct2013  Discussion/Summary   I drew Erika Weaver a picture. We decided to treat it with lithotripsy.   We talked about ESWL in detail. Pros, cons, general surgical and anesthetic risks, and other options including watchful waiting and ureteroscopy were discussed. Success and failure rates and need for further/repeat therapy were discussed. Risks were described but not limited to pain, infection, sepsis, and bleeding. The risk of renal and ureteral trauma with short and long term sequelae was discussed. The risk of injury to adjacent structures was discussed. The risk of needing a stent post-ESWL was discussed.  We are going to schedule it in approximately 2 weeks and cancel it if she passes  it in the meantime. Three more weeks of Rapaflo samples given.  After a thorough review of the management options for the patient's condition the patient  elected to proceed with surgical therapy as noted above. We have discussed the potential benefits and risks of the procedure, side effects of the proposed treatment, the likelihood of the patient achieving the goals of the procedure, and any potential problems that might occur during the  procedure or recuperation. Informed consent has been obtained.

## 2012-12-04 ENCOUNTER — Other Ambulatory Visit: Payer: BC Managed Care – PPO

## 2012-12-11 ENCOUNTER — Ambulatory Visit: Payer: BC Managed Care – PPO | Admitting: Family Medicine

## 2012-12-11 ENCOUNTER — Other Ambulatory Visit (INDEPENDENT_AMBULATORY_CARE_PROVIDER_SITE_OTHER): Payer: BC Managed Care – PPO

## 2012-12-11 DIAGNOSIS — Z0000 Encounter for general adult medical examination without abnormal findings: Secondary | ICD-10-CM

## 2012-12-11 LAB — LIPID PANEL
HDL: 35.2 mg/dL — ABNORMAL LOW (ref >39.00)
Total CHOL/HDL Ratio: 6
Triglycerides: 142 mg/dL (ref 0.0–149.0)

## 2012-12-11 LAB — HEPATIC FUNCTION PANEL
ALT: 28 U/L (ref 0–35)
Albumin: 3.5 g/dL (ref 3.5–5.2)
Total Protein: 7 g/dL (ref 6.0–8.3)

## 2012-12-11 LAB — CBC
Hemoglobin: 14 g/dL (ref 12.0–15.0)
MCHC: 34.3 g/dL (ref 30.0–36.0)
MCV: 90.3 fl (ref 78.0–100.0)
Platelets: 270 10*3/uL (ref 150.0–400.0)
RBC: 4.53 Mil/uL (ref 3.87–5.11)

## 2012-12-11 LAB — RENAL FUNCTION PANEL
Albumin: 3.5 g/dL (ref 3.5–5.2)
Chloride: 105 mEq/L (ref 96–112)
GFR: 82.79 mL/min (ref >60.00)
Glucose, Bld: 83 mg/dL (ref 70–99)
Phosphorus: 3.2 mg/dL (ref 2.3–4.6)
Potassium: 4.2 mEq/L (ref 3.5–5.1)

## 2012-12-11 LAB — TSH: TSH: 1.49 u[IU]/mL (ref 0.35–5.50)

## 2012-12-12 ENCOUNTER — Ambulatory Visit: Payer: BC Managed Care – PPO | Admitting: Family Medicine

## 2012-12-19 ENCOUNTER — Ambulatory Visit (INDEPENDENT_AMBULATORY_CARE_PROVIDER_SITE_OTHER): Payer: BC Managed Care – PPO | Admitting: Family Medicine

## 2012-12-19 ENCOUNTER — Encounter: Payer: Self-pay | Admitting: Family Medicine

## 2012-12-19 VITALS — BP 135/83 | HR 75 | Temp 99.0°F | Ht 65.75 in | Wt 221.8 lb

## 2012-12-19 DIAGNOSIS — E785 Hyperlipidemia, unspecified: Secondary | ICD-10-CM

## 2012-12-19 DIAGNOSIS — G4730 Sleep apnea, unspecified: Secondary | ICD-10-CM

## 2012-12-19 DIAGNOSIS — R030 Elevated blood-pressure reading, without diagnosis of hypertension: Secondary | ICD-10-CM

## 2012-12-19 DIAGNOSIS — L309 Dermatitis, unspecified: Secondary | ICD-10-CM

## 2012-12-19 DIAGNOSIS — N200 Calculus of kidney: Secondary | ICD-10-CM

## 2012-12-19 DIAGNOSIS — T7840XD Allergy, unspecified, subsequent encounter: Secondary | ICD-10-CM

## 2012-12-19 DIAGNOSIS — N760 Acute vaginitis: Secondary | ICD-10-CM

## 2012-12-19 MED ORDER — TRIAMCINOLONE ACETONIDE 0.1 % EX CREA: TOPICAL_CREAM | Freq: Every evening | CUTANEOUS | Status: DC | PRN

## 2012-12-19 MED ORDER — KRILL OIL PO CAPS: ORAL_CAPSULE | ORAL | Status: DC

## 2012-12-19 MED ORDER — CYCLOBENZAPRINE HCL 10 MG PO TABS: 10.0000 mg | ORAL_TABLET | Freq: Every evening | ORAL | Status: DC | PRN

## 2012-12-19 MED ORDER — FLUCONAZOLE 150 MG PO TABS: 150.0000 mg | ORAL_TABLET | Freq: Once | ORAL | Status: DC

## 2012-12-19 MED ORDER — PROBIOTIC PRODUCT PO CHEW: CHEWABLE_TABLET | ORAL | Status: DC

## 2012-12-19 NOTE — Patient Instructions (Signed)
Sleep Apnea  Sleep apnea is a sleep disorder characterized by abnormal pauses in breathing while you sleep. When your breathing pauses, the level of oxygen in your blood decreases. This causes you to move out of deep sleep and into light sleep. As a result, your quality of sleep is poor, and the system that carries your blood throughout your body (cardiovascular system) experiences stress. If sleep apnea remains untreated, the following conditions can develop:  High blood pressure (hypertension).  Coronary artery disease.  Inability to achieve or maintain an erection (impotence).  Impairment of your thought process (cognitive dysfunction). There are three types of sleep apnea: 1. Obstructive sleep apnea Pauses in breathing during sleep because of a blocked airway. 2. Central sleep apnea Pauses in breathing during sleep because the area of the brain that controls your breathing does not send the correct signals to the muscles that control breathing. 3. Mixed sleep apnea A combination of both obstructive and central sleep apnea. RISK FACTORS The following risk factors can increase your risk of developing sleep apnea:  Being overweight.  Smoking.  Having narrow passages in your nose and throat.  Being of older age.  Being female.  Alcohol use.  Sedative and tranquilizer use.  Ethnicity. Among individuals younger than 35 years, African Americans are at increased risk of sleep apnea. SYMPTOMS   Difficulty staying asleep.  Daytime sleepiness and fatigue.  Loss of energy.  Irritability.  Loud, heavy snoring.  Morning headaches.  Trouble concentrating.  Forgetfulness.  Decreased interest in sex. DIAGNOSIS  In order to diagnose sleep apnea, your caregiver will perform a physical examination. Your caregiver may suggest that you take a home sleep test. Your caregiver may also recommend that you spend the night in a sleep lab. In the sleep lab, several monitors record  information about your heart, lungs, and brain while you sleep. Your leg and arm movements and blood oxygen level are also recorded. TREATMENT The following actions may help to resolve mild sleep apnea:  Sleeping on your side.   Using a decongestant if you have nasal congestion.   Avoiding the use of depressants, including alcohol, sedatives, and narcotics.   Losing weight and modifying your diet if you are overweight. There also are devices and treatments to help open your airway:  Oral appliances. These are custom-made mouthpieces that shift your lower jaw forward and slightly open your bite. This opens your airway.  Devices that create positive airway pressure. This positive pressure "splints" your airway open to help you breathe better during sleep. The following devices create positive airway pressure:  Continuous positive airway pressure (CPAP) device. The CPAP device creates a continuous level of air pressure with an air pump. The air is delivered to your airway through a mask while you sleep. This continuous pressure keeps your airway open.  Nasal expiratory positive airway pressure (EPAP) device. The EPAP device creates positive air pressure as you exhale. The device consists of single-use valves, which are inserted into each nostril and held in place by adhesive. The valves create very little resistance when you inhale but create much more resistance when you exhale. That increased resistance creates the positive airway pressure. This positive pressure while you exhale keeps your airway open, making it easier to breath when you inhale again.  Bilevel positive airway pressure (BPAP) device. The BPAP device is used mainly in patients with central sleep apnea. This device is similar to the CPAP device because it also uses an air pump to deliver   continuous air pressure through a mask. However, with the BPAP machine, the pressure is set at two different levels. The pressure when you  exhale is lower than the pressure when you inhale.  Surgery. Typically, surgery is only done if you cannot comply with less invasive treatments or if the less invasive treatments do not improve your condition. Surgery involves removing excess tissue in your airway to create a wider passage way. Document Released: 09/28/2002 Document Revised: 04/08/2012 Document Reviewed: 02/14/2012 [employer] Patient Information 2013 [employer].  

## 2012-12-21 ENCOUNTER — Encounter: Payer: Self-pay | Admitting: Family Medicine

## 2012-12-21 DIAGNOSIS — N200 Calculus of kidney: Secondary | ICD-10-CM

## 2012-12-21 HISTORY — DX: Calculus of kidney: N20.0

## 2012-12-21 NOTE — Progress Notes (Signed)
Patient ID: Erika Weaver, female   DOB: November 06, 1975, 37 y.o.   MRN: [id number] DESHAYLA EMPSON [id number] 11/28/1975 12/21/2012      Progress Note-Follow Up  Subjective  Chief Complaint  Chief Complaint  Patient presents with  . Follow-up    6 month    HPI  Patient is a 37 year old Caucasian female in today for followup. She has been struggling with repercussions of a kidney stone since September. She's been sleeping better at this time but has been following with urology after having stone crushed and having trouble clearing. Has recently recovered from bronchitis denies any cough or fevers. No headache, chest pain, palpitations, shortness of, GI or GU concerns. Is using her CPAP regularly  Past Medical History  Diagnosis Date  . Chicken pox as a child  . Overweight   . Headache 11/22/2011  . Allergic state 11/22/2011  . Visual changes 11/22/2011  . Elevated BP 11/22/2011  . Anxiety 11/22/2011  . Sinusitis 11/22/2011  . Plantar fasciitis 01/11/2012  . Fatigue 01/11/2012  . Pharyngitis 01/11/2012  . Sleep apnea 04/03/2012    setting 2  . Chronic kidney disease     kidney stones  . Hypertension   . Kidney stone 12/21/2012    Past Surgical History  Procedure Laterality Date  . Gum surgery    . Wisdom tooth extraction    . Perineal body repair      4 th degree tear with fistula between vagina and retum requiring 3 surgeries for correction    Family History  Problem Relation Age of Onset  . Diabetes Mother     type 2  . Hypertension Mother   . Hyperlipidemia Mother   . Hypertension Father   . Hyperlipidemia Father   . Aneurysm Father     abdominal aorta  . Cancer Maternal Grandmother 75    colon  . Other Maternal Grandfather 41    brain hemorrhage  . Stroke Maternal Grandfather   . Aneurysm Maternal Grandfather   . Heart attack Paternal Grandmother   . Heart disease Paternal Grandmother     MI  . Alzheimer's disease Paternal Grandfather   . Dementia Paternal  Grandfather   . Heart attack Paternal Aunt   . Heart disease Paternal Aunt     MI  . Other Paternal Uncle     cerebreal brain hemorrhage  . Stroke Paternal Uncle   . Aneurysm Paternal Uncle   . Asthma Daughter   . Other Daughter     peanut allergy  . Aneurysm Maternal Uncle 60    coiled, behind eye    History   Social History  . Marital Status: Married    Spouse Name: N/A    Number of Children: N/A  . Years of Education: N/A   Occupational History  . Not on file.   Social History Main Topics  . Smoking status: Never Smoker   . Smokeless tobacco: Never Used  . Alcohol Use: No  . Drug Use: No  . Sexually Active: Not on file   Other Topics Concern  . Not on file   Social History Narrative  . No narrative on file    Current Outpatient Prescriptions on File Prior to Visit  Medication Sig Dispense Refill  . metoprolol succinate (TOPROL-XL) 25 MG 24 hr tablet Take 25 mg by mouth daily. Tries to take mid morning       No current facility-administered medications on file prior to visit.    No Known  Allergies  Review of Systems  Review of Systems  Constitutional: Negative for fever and malaise/fatigue.  HENT: Positive for congestion.   Eyes: Negative for discharge.  Respiratory: Positive for cough. Negative for shortness of breath.   Cardiovascular: Negative for chest pain, palpitations and leg swelling.  Gastrointestinal: Negative for nausea, abdominal pain and diarrhea.  Genitourinary: Negative for dysuria.  Musculoskeletal: Negative for falls.  Skin: Negative for rash.  Neurological: Negative for loss of consciousness and headaches.  Endo/Heme/Allergies: Negative for polydipsia.  Psychiatric/Behavioral: Negative for depression and suicidal ideas. The patient is not nervous/anxious and does not have insomnia.     Objective  BP 135/83  Pulse 75  Temp(Src) 99 F (37.2 C) (Temporal)  Ht 5' 5.75" (1.67 m)  Wt 221 lb 12.8 oz (100.608 kg)  BMI 36.07 kg/m2   SpO2 99%  LMP 11/28/2012  Physical Exam  Physical Exam  Constitutional: She is oriented to person, place, and time and well-developed, well-nourished, and in no distress. No distress.  HENT:  Head: Normocephalic and atraumatic.  Eyes: Conjunctivae are normal.  Neck: Neck supple. No thyromegaly present.  Cardiovascular: Normal rate, regular rhythm and normal heart sounds.   No murmur heard. Pulmonary/Chest: Effort normal and breath sounds normal. She has no wheezes.  Abdominal: She exhibits no distension and no mass.  Musculoskeletal: She exhibits no edema.  Lymphadenopathy:    She has no cervical adenopathy.  Neurological: She is alert and oriented to person, place, and time.  Skin: Skin is warm and dry. No rash noted. She is not diaphoretic.  Psychiatric: Memory, affect and judgment normal.    Lab Results  Component Value Date   TSH 1.49 12/11/2012   Lab Results  Component Value Date   WBC 9.1 12/11/2012   HGB 14.0 12/11/2012   HCT 40.9 12/11/2012   MCV 90.3 12/11/2012   PLT 270.0 12/11/2012   Lab Results  Component Value Date   CREATININE 0.8 12/11/2012   BUN 17 12/11/2012   Mina Carlisi 136 12/11/2012   K 4.2 12/11/2012   CL 105 12/11/2012   CO2 26 12/11/2012   Lab Results  Component Value Date   ALT 28 12/11/2012   AST 17 12/11/2012   ALKPHOS 75 12/11/2012   BILITOT 0.8 12/11/2012   Lab Results  Component Value Date   CHOL 198 12/11/2012   Lab Results  Component Value Date   HDL 35.20* 12/11/2012   Lab Results  Component Value Date   LDLCALC 134* 12/11/2012   Lab Results  Component Value Date   TRIG 142.0 12/11/2012   Lab Results  Component Value Date   CHOLHDL 6 12/11/2012     Assessment & Plan  Elevated BP Well controlled today  Kidney stone In September had to have a stone destroyed and has been following closely with Urology ever since. Is doing much better now. enocuraged good hydration  Sleep apnea Uses CPAP regularly and feels well   Allergic  state Recovering form couhg and treated as a flu contact.

## 2012-12-21 NOTE — Assessment & Plan Note (Signed)
In September had to have a stone destroyed and has been following closely with Urology ever since. Is doing much better now. enocuraged good hydration

## 2012-12-21 NOTE — Assessment & Plan Note (Signed)
Well controlled today.

## 2012-12-21 NOTE — Assessment & Plan Note (Signed)
Uses CPAP regularly and feels well

## 2012-12-21 NOTE — Assessment & Plan Note (Signed)
Recovering form couhg and treated as a flu contact.

## 2013-01-30 ENCOUNTER — Ambulatory Visit (INDEPENDENT_AMBULATORY_CARE_PROVIDER_SITE_OTHER): Payer: BC Managed Care – PPO | Admitting: Family Medicine

## 2013-01-30 ENCOUNTER — Encounter: Payer: Self-pay | Admitting: Family Medicine

## 2013-01-30 VITALS — BP 132/82 | HR 71 | Temp 99.2°F | Ht 65.75 in | Wt 221.0 lb

## 2013-01-30 DIAGNOSIS — M25519 Pain in unspecified shoulder: Secondary | ICD-10-CM

## 2013-01-30 DIAGNOSIS — N823 Fistula of vagina to large intestine: Secondary | ICD-10-CM

## 2013-01-30 DIAGNOSIS — N200 Calculus of kidney: Secondary | ICD-10-CM

## 2013-01-30 DIAGNOSIS — N824 Other female intestinal-genital tract fistulae: Secondary | ICD-10-CM

## 2013-01-30 DIAGNOSIS — R030 Elevated blood-pressure reading, without diagnosis of hypertension: Secondary | ICD-10-CM

## 2013-01-30 DIAGNOSIS — M25511 Pain in right shoulder: Secondary | ICD-10-CM

## 2013-01-30 DIAGNOSIS — E860 Dehydration: Secondary | ICD-10-CM

## 2013-01-30 DIAGNOSIS — M549 Dorsalgia, unspecified: Secondary | ICD-10-CM

## 2013-01-30 MED ORDER — METHOCARBAMOL 500 MG PO TABS: 500.0000 mg | ORAL_TABLET | Freq: Three times a day (TID) | ORAL | Status: DC | PRN

## 2013-01-30 MED ORDER — NAPROXEN SODIUM 220 MG PO TABS: 220.0000 mg | ORAL_TABLET | Freq: Two times a day (BID) | ORAL | Status: DC | PRN

## 2013-01-30 NOTE — Patient Instructions (Addendum)

## 2013-01-31 ENCOUNTER — Encounter: Payer: Self-pay | Admitting: Family Medicine

## 2013-01-31 DIAGNOSIS — M549 Dorsalgia, unspecified: Secondary | ICD-10-CM | POA: Insufficient documentation

## 2013-01-31 DIAGNOSIS — N823 Fistula of vagina to large intestine: Secondary | ICD-10-CM

## 2013-01-31 DIAGNOSIS — E860 Dehydration: Secondary | ICD-10-CM | POA: Insufficient documentation

## 2013-01-31 HISTORY — DX: Dorsalgia, unspecified: M54.9

## 2013-01-31 HISTORY — DX: Fistula of vagina to large intestine: N82.3

## 2013-01-31 NOTE — Assessment & Plan Note (Signed)
Has just been told by her GYN that this will require repair, no significant symptoms at this time

## 2013-01-31 NOTE — Assessment & Plan Note (Signed)
May try heat patches. Switch from Flexeril to Robaxin 500 mg tid as tolerated and as needed to see if she tolerates. Try Aleve 220 mg po bid as needed for pain and encouraged weight loss and increased exercise. Xray next week.

## 2013-01-31 NOTE — Assessment & Plan Note (Signed)
Well controlled at today's visit. 

## 2013-01-31 NOTE — Assessment & Plan Note (Signed)
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 Encouraged a switch from Dr Reino Kent to Gatorade and increase hydation and make sure she eats proteins with every meal

## 2013-01-31 NOTE — Assessment & Plan Note (Signed)
No recent stones, has given up tea

## 2013-01-31 NOTE — Progress Notes (Signed)
Patient ID: Erika Weaver, female   DOB: [birth year], 37 y.o.   MRN: [id number] Erika Weaver [id number] 12-14-1975 01/31/2013      Progress Note-Follow Up  Subjective  Chief Complaint  Chief Complaint  Patient presents with  . Dizziness    random dizzy spells    HPI  Patient is a 37 year old Caucasian female who is in today to discuss episodes of feeling slightly lightheaded. They occur only occasionally and her not long lived. She does not note a pattern of position changes or activity. He can occur anytime during the day. They're not associated with eating or not eating. 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 She is noting some persistent shoulder and muscle pain. She has some low back pain which is tolerable and does not radiate but is occurring frequently. No incontinence GI or GU concerns. Except for her gynecologist has recently told her on exam obesity rectovaginal fistula. So far it is not symptomatic but she is in need of repair. She's had no further kidney stones status post lithotripsy she has given up her sweet tea but continues to Dr. Reino Weaver. Chest pain or palpitations. No headaches or other neurologic complaints. No GI or GU concerns at this time.  Past Medical History  Diagnosis Date  . Chicken pox as a child  . Overweight   . Headache 11/22/2011  . Allergic state 11/22/2011  . Visual changes 11/22/2011  . Elevated BP 11/22/2011  . Anxiety 11/22/2011  . Sinusitis 11/22/2011  . Plantar fasciitis 01/11/2012  . Fatigue 01/11/2012  . Pharyngitis 01/11/2012  . Sleep apnea 04/03/2012    setting 2  . Chronic kidney disease     kidney stones  . Hypertension   . Kidney stone 12/21/2012  . Back pain 01/31/2013  . 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 Dehydration 01/31/2013    Past Surgical History  Procedure Laterality Date  . Gum surgery    . Wisdom tooth extraction    . Perineal body repair      4 th degree tear with fistula between vagina  and retum requiring 3 surgeries for correction    Family History  Problem Relation Age of Onset  . Diabetes Mother     type 2  . Hypertension Mother   . Hyperlipidemia Mother   . Hypertension Father   . Hyperlipidemia Father   . Aneurysm Father     abdominal aorta  . Cancer Maternal Grandmother 51    colon  . Other Maternal Grandfather 41    brain hemorrhage  . Stroke Maternal Grandfather   . Aneurysm Maternal Grandfather   . Heart attack Paternal Grandmother   . Heart disease Paternal Grandmother     MI  . Alzheimer's disease Paternal Grandfather   . Dementia Paternal Grandfather   . Heart attack Paternal Aunt   . Heart disease Paternal Aunt     MI  . Other Paternal Uncle     cerebreal brain hemorrhage  . Stroke Paternal Uncle   . Aneurysm Paternal Uncle   . Asthma Daughter   . Other Daughter     peanut allergy  . Aneurysm Maternal Uncle 60    coiled, behind eye    History   Social History  . Marital Status: Married    Spouse Name: N/A    Number of Children: N/A  . Years of Education: N/A   Occupational History  . Not on file.   Social History  Main Topics  . Smoking status: Never Smoker   . Smokeless tobacco: Never Used  . Alcohol Use: No  . Drug Use: No  . Sexually Active: Not on file   Other Topics Concern  . Not on file   Social History Narrative  . No narrative on file    Current Outpatient Prescriptions on File Prior to Visit  Medication Sig Dispense Refill  . metoprolol succinate (TOPROL-XL) 25 MG 24 hr tablet Take 25 mg by mouth daily. Tries to take mid morning       No current facility-administered medications on file prior to visit.    No Known Allergies  Review of Systems  Review of Systems  Constitutional: Negative for fever and malaise/fatigue.  HENT: Negative for congestion.   Eyes: Negative for discharge.  Respiratory: Negative for shortness of breath.   Cardiovascular: Negative for chest pain, palpitations and leg  swelling.  Gastrointestinal: Negative for nausea, abdominal pain and diarrhea.  Genitourinary: Negative for dysuria.  Musculoskeletal: Positive for back pain. Negative for falls.  Skin: Negative for rash.  Neurological: Positive for dizziness. Negative for loss of consciousness and headaches.  Endo/Heme/Allergies: Negative for polydipsia.  Psychiatric/Behavioral: Negative for depression and suicidal ideas. The patient is not nervous/anxious and does not have insomnia.     Objective  BP 132/82  Pulse 71  Temp(Src) 99.2 F (37.3 C) (Temporal)  Ht 5' 5.75" (1.67 m)  Wt 221 lb (100.245 kg)  BMI 35.94 kg/m2  SpO2 98%  LMP 01/29/2013  Physical Exam  Physical Exam  Constitutional: She is oriented to person, place, and time and well-developed, well-nourished, and in no distress. No distress.  HENT:  Head: Normocephalic and atraumatic.  Dry mucus membranes  Eyes: Conjunctivae are normal.  Neck: Neck supple. No thyromegaly present.  Cardiovascular: Normal rate, regular rhythm and normal heart sounds.   No murmur heard. Pulmonary/Chest: Effort normal and breath sounds normal. She has no wheezes.  Abdominal: She exhibits no distension and no mass.  Musculoskeletal: She exhibits tenderness. She exhibits no edema.  Mild pain with palpation over lumbar paravertebral muscles b/l  Lymphadenopathy:    She has no cervical adenopathy.  Neurological: She is alert and oriented to person, place, and time.  Skin: Skin is warm and dry. No rash noted. She is not diaphoretic.  Psychiatric: Memory, affect and judgment normal.    Lab Results  Component Value Date   TSH 1.49 12/11/2012   Lab Results  Component Value Date   WBC 9.1 12/11/2012   HGB 14.0 12/11/2012   HCT 40.9 12/11/2012   MCV 90.3 12/11/2012   PLT 270.0 12/11/2012   Lab Results  Component Value Date   CREATININE 0.8 12/11/2012   BUN 17 12/11/2012   NA 136 12/11/2012   K 4.2 12/11/2012   CL 105 12/11/2012   CO2 26 12/11/2012    Lab Results  Component Value Date   ALT 28 12/11/2012   AST 17 12/11/2012   ALKPHOS 75 12/11/2012   BILITOT 0.8 12/11/2012   Lab Results  Component Value Date   CHOL 198 12/11/2012   Lab Results  Component Value Date   HDL 35.20* 12/11/2012   Lab Results  Component Value Date   LDLCALC 134* 12/11/2012   Lab Results  Component Value Date   TRIG 142.0 12/11/2012   Lab Results  Component Value Date   CHOLHDL 6 12/11/2012     Assessment & Plan  Elevated BP Well controlled at today's visit  Dehydration  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 Encouraged a switch from Dr Erika Weaver to Gatorade and increase hydation and make sure she eats proteins with every meal  Back pain May try heat patches. Switch from Flexeril to Robaxin 500 mg tid as tolerated and as needed to see if she tolerates. Try Aleve 220 mg po bid as needed for pain and encouraged weight loss and increased exercise. Xray next week.  Kidney stone No recent stones, has given up tea  Rectal vaginal fistula Has just been told by her GYN that this will require repair, no significant symptoms at this time

## 2013-02-03 ENCOUNTER — Ambulatory Visit (HOSPITAL_BASED_OUTPATIENT_CLINIC_OR_DEPARTMENT_OTHER)
Admission: RE | Admit: 2013-02-03 | Discharge: 2013-02-03 | Disposition: A | Discharge status: Home / Self Care | Payer: BC Managed Care – PPO | Source: Ambulatory Visit | Attending: Family Medicine | Admitting: Family Medicine

## 2013-02-03 DIAGNOSIS — M549 Dorsalgia, unspecified: Secondary | ICD-10-CM

## 2013-02-03 DIAGNOSIS — M47817 Spondylosis without myelopathy or radiculopathy, lumbosacral region: Secondary | ICD-10-CM | POA: Insufficient documentation

## 2013-02-10 NOTE — Progress Notes (Signed)
Left a detailed message on patients vm 

## 2013-02-16 ENCOUNTER — Telehealth: Payer: Self-pay | Admitting: *Deleted

## 2013-02-16 NOTE — Telephone Encounter (Signed)
Patient called for her radiology report. Patient wanted to know if there was anything else that needed to be done or anything that she needs to be doing? What he next step is. Please advise?

## 2013-02-16 NOTE — Telephone Encounter (Signed)
If pain is persistent then would refer to orthopaedics for further treatment

## 2013-02-17 NOTE — Telephone Encounter (Signed)
Left detailed message on voicemail.  

## 2013-02-23 DIAGNOSIS — G4733 Obstructive sleep apnea (adult) (pediatric): Secondary | ICD-10-CM | POA: Insufficient documentation

## 2013-02-23 DIAGNOSIS — I10 Essential (primary) hypertension: Secondary | ICD-10-CM | POA: Insufficient documentation

## 2013-03-01 DIAGNOSIS — N750 Cyst of Bartholin's gland: Secondary | ICD-10-CM | POA: Insufficient documentation

## 2013-03-01 HISTORY — DX: Cyst of Bartholin's gland: N75.0

## 2013-03-03 ENCOUNTER — Telehealth: Payer: Self-pay | Admitting: Family Medicine

## 2013-03-03 MED ORDER — METOPROLOL SUCCINATE ER 25 MG PO TB24: 25.0000 mg | ORAL_TABLET | Freq: Every day | ORAL | Status: DC

## 2013-03-03 NOTE — Telephone Encounter (Signed)
Refill- metoprolol er succinate 25mg  tab. Take one tablet by mouth every day. Qty 30 last fill 3.31.14

## 2013-04-06 ENCOUNTER — Ambulatory Visit (INDEPENDENT_AMBULATORY_CARE_PROVIDER_SITE_OTHER): Payer: BC Managed Care – PPO | Admitting: Nurse Practitioner

## 2013-04-06 ENCOUNTER — Encounter: Payer: Self-pay | Admitting: Nurse Practitioner

## 2013-04-06 VITALS — BP 110/80 | HR 73 | Temp 97.0°F | Ht 65.75 in | Wt 217.8 lb

## 2013-04-06 DIAGNOSIS — W57XXXA Bitten or stung by nonvenomous insect and other nonvenomous arthropods, initial encounter: Secondary | ICD-10-CM

## 2013-04-06 NOTE — Patient Instructions (Signed)
Wash site with soap & water, administer bacitracin at bite site, cover with band-aid. Do this twice daily until there is no redness. Monitor for symptoms such as headache, fever, rash, joint pain. Seek medical treatment if you experience any of these symtpoms. Have a great vacation!   Deer Tick Bite Deer ticks are brown arachnids (spider family) that vary in size from as small as the head of a pin to 1/4 inch (1/2 cm) diameter. They thrive in wooded areas. Deer are the preferred host of adult deer ticks. Small rodents are the host of young ticks (nymphs). When a person walks in a field or wooded area, young and adult ticks in the surrounding grass and vegetation can attach themselves to the skin. They can suck blood for hours to days if unnoticed. Ticks are found all over the [place] Some ticks carry a specific bacteria (Borrelia burgdorferi) that causes an infection called Lyme disease. The bacteria is typically passed into a person during the blood sucking process. This happens after the tick has been attached for at least a number of hours. While ticks can be found all over the [place] those carrying the bacteria that causes Lyme disease are most common in [place] and the [place]. Only a small proportion of ticks in these areas carry the Lyme disease bacteria and cause human infections. Ticks usually attach to warm spots on the body, such as the:  Head.  Back.  Neck.  Armpits.  Groin. SYMPTOMS  Most of the time, a deer tick bite will not be felt. You may or may not see the attached tick. You may notice mild irritation or redness around the bite site. If the deer tick passes the Lyme disease bacteria to a person, a round, red rash may be noticed 2 to 3 days after the bite. The rash may be clear in the middle, like a bull's-eye or target. If not treated, other symptoms may develop several days to weeks after the onset of the rash. These symptoms may include:  New rash lesions.  Fatigue and  weakness.  General ill feeling and achiness.  Chills.  Headache and neck pain.  Swollen lymph glands.  Sore muscles and joints. 5 to 15% of untreated people with Lyme disease may develop more severe illnesses after several weeks to months. This may include inflammation of the brain lining (meningitis), nerve palsies, an abnormal heartbeat, or severe muscle and joint pain and inflammation (myositis or arthritis). DIAGNOSIS   Physical exam and medical history.  Viewing the tick if it was saved for confirmation.  Blood tests (to check or confirm the presence of Lyme disease). TREATMENT  Most ticks do not carry disease. If found, an attached tick should be removed using tweezers. Tweezers should be placed under the body of the tick so it is removed by its attachment parts (pincers). If there are signs or symptoms of being sick, or Lyme disease is confirmed, medicines (antibiotics) that kill germs are usually prescribed. In more severe cases, antibiotics may be given through an intravenous (IV) access. HOME CARE INSTRUCTIONS   Always remove ticks with tweezers. Do not use petroleum jelly or other methods to kill or remove the tick. Slide the tweezers under the body and pull out as much as you can. If you are not sure what it is, save it in a jar and show your caregiver.  Once you remove the tick, the skin will heal on its own. Wash your hands and the affected area with  water and soap. You may place a bandage on the affected area.  Take medicine as directed. You may be advised to take a full course of antibiotics.  Follow up with your caregiver as recommended. FINDING OUT THE RESULTS OF YOUR TEST Not all test results are available during your visit. If your test results are not back during the visit, make an appointment with your caregiver to find out the results. Do not assume everything is normal if you have not heard from your caregiver or the medical facility. It is important for you to  follow up on all of your test results. PROGNOSIS  If Lyme disease is confirmed, early treatment with antibiotics is very effective. Following preventive guidelines is important since it is possible to get the disease more than once. PREVENTION   Wear long sleeves and long pants in wooded or grassy areas. Tuck your pants into your socks.  Use an insect repellent while hiking.  Check yourself, your children, and your pets regularly for ticks after playing outside.  Clear piles of leaves or brush from your yard. Ticks might live there. SEEK MEDICAL CARE IF:   You or your child has an oral temperature above 102 F (38.9 C).  You develop a severe headache following the bite.  You feel generally ill.  You notice a rash.  You are having trouble removing the tick.  The bite area has red skin or yellow drainage. SEEK IMMEDIATE MEDICAL CARE IF:   Your face is weak and droopy or you have other neurological symptoms.  You have severe joint pain or weakness. MAKE SURE YOU:   Understand these instructions.  Will watch your condition.  Will get help right away if you are not doing well or get worse. FOR MORE INFORMATION Centers for Disease Control and Prevention: FootballExhibition.com.br American Academy of [employer]icians: www.https://powers.com/ Document Released: 01/02/2010 Document Revised: 12/31/2011 Document Reviewed: 01/02/2010 [facility] Patient Information 2014 [place], [place].

## 2013-04-06 NOTE — Progress Notes (Signed)
  Subjective:    Patient ID: Erika Weaver, female    DOB: August 28, 1976, 37 y.o.   MRN: [id number]  HPI Comments: Pt presents with c/o tick bite. She found small insect on R upper thigh this am. Site is slightly red, no pain. Denies rash. Brought tick in office in ziplock bag.     Review of Systems  Constitutional: Negative for fever, chills, activity change and fatigue.  HENT: Negative for neck pain and neck stiffness.   Respiratory: Negative for shortness of breath.   Cardiovascular: Negative for chest pain.  Gastrointestinal: Negative for abdominal pain.  Musculoskeletal: Negative for myalgias, joint swelling and arthralgias.  Skin: Negative for rash.  Neurological: Negative for headaches.       Objective:   Physical Exam  Constitutional: She is oriented to person, place, and time. She appears well-developed and well-nourished. No distress.  HENT:  Head: Normocephalic and atraumatic.  Eyes: Conjunctivae are normal. Right eye exhibits no discharge. Left eye exhibits no discharge.  Cardiovascular: Normal rate.   Pulmonary/Chest: Effort normal.  Musculoskeletal:  Normal gait  Neurological: She is alert and oriented to person, place, and time.  Skin: Skin is warm and dry. No rash noted.  1 mm pink lesion R upper thigh. Insect is approx. 3mm, legs present , light brown. Cannot make pos ID as deer tick, but likely that it is.    Psychiatric: She has a normal mood and affect. Her behavior is normal. Thought content normal.          Assessment & Plan:  1. Insect bite, possibly deer tick. Site does not appear to have retained insect parts. See pt instructions.

## 2013-05-22 ENCOUNTER — Encounter: Payer: Self-pay | Admitting: Family Medicine

## 2013-05-22 ENCOUNTER — Telehealth: Payer: Self-pay | Admitting: Family Medicine

## 2013-05-22 ENCOUNTER — Ambulatory Visit (INDEPENDENT_AMBULATORY_CARE_PROVIDER_SITE_OTHER): Payer: BC Managed Care – PPO | Admitting: Family Medicine

## 2013-05-22 VITALS — BP 125/78 | HR 66 | Temp 98.4°F | Resp 18 | Ht 65.75 in | Wt 224.0 lb

## 2013-05-22 DIAGNOSIS — M545 Low back pain, unspecified: Secondary | ICD-10-CM

## 2013-05-22 NOTE — Telephone Encounter (Signed)
Patient Information:  Caller Name: Lori  Phone: [phone]  Patient: Erika Weaver, Erika Weaver  Gender: Female  DOB: [birth year]  Age: 37 Years  PCP: Danise Edge [facility])  Pregnant: No  Office Follow Up:  Does the office need to follow up with this patient?: No  Instructions For The Office: N/A   Symptoms  Reason For Call & Symptoms: [name] states she had onset of right sided back pain at waist line and radiates to buttocks down leg at 0800. Rates pain a 7 or 8 on 1/10 pt scale. Used heat and Aleve 2 tabs with partial relief. Per back pain protocol has see today in office disposition due to pain radiates into thigh area and leg. No appointments available at [facility]. Appointment scheduled at [facility] with Dr. Milinda Cave for 15:30 on 05/22/13.  Reviewed Health History In EMR: Yes  Reviewed Medications In EMR: Yes  Reviewed Allergies In EMR: Yes  Reviewed Surgeries / Procedures: Yes  Date of Onset of Symptoms: 05/22/2013  Treatments Tried: Heat, Aleve  Treatments Tried Worked: Yes OB / GYN:  LMP: 04/29/2013  Guideline(s) Used:  Back Pain  Disposition Per Guideline:   See Today in Office  Reason For Disposition Reached:   Pain radiates into the thigh or further down the leg, and in both legs  Advice Given:  N/A  Patient Will Follow Care Advice:  YES  Appointment Scheduled:  05/22/2013 15:30:00 Appointment Scheduled Provider:  Dr. Milinda Cave

## 2013-05-22 NOTE — Progress Notes (Signed)
OFFICE NOTE  05/22/2013  CC:  Chief Complaint  Patient presents with  . Back Pain    restarted today  . Hip Pain    Right sided      HPI: Patient is a 37 y.o. Caucasian female who is here for back/hip pain. Onset this morning in right low back and right iliac crest area.  Alleve and heat have been used--heat helped some.   No radiation past the point of her right lateral buttocks region, no paresthesias or leg weakness. Worse with bending forward.  The pain is constant.  Severity rated 7/10 intensity.   Pt reports same thing in left LB/hip a couple of months ago, she says this gradually resolved over a month or so.  She has had no injury or recent trauma or recent overuse activity that was prior to onset of her pain. No different bed/mattress.  Denies skin changes in the region.   Pertinent PMH:  Past Medical History  Diagnosis Date  . Chicken pox as a child  . Overweight(278.02)   . Headache(784.0) 11/22/2011  . Allergic state 11/22/2011  . Visual changes 11/22/2011  . Elevated BP 11/22/2011  . Anxiety 11/22/2011  . Sinusitis 11/22/2011  . Plantar fasciitis 01/11/2012  . Fatigue 01/11/2012  . Pharyngitis 01/11/2012  . Sleep apnea 04/03/2012    setting 2  . Chronic kidney disease     kidney stones  . Hypertension   . Kidney stone 12/21/2012  . Back pain 01/31/2013  . 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 Dehydration 01/31/2013   Past Surgical History  Procedure Laterality Date  . Gum surgery    . Wisdom tooth extraction    . Perineal body repair      4 th degree tear with fistula between vagina and retum requiring 3 surgeries for correction    MEDS:  Toprol XL 25mg  qd, alleve earlier today  PE: Blood pressure 125/78, pulse 66, temperature 98.4 F (36.9 C), temperature source Temporal, resp. rate 18, height 5' 5.75" (1.67 m), weight 224 lb (101.606 kg), last menstrual period 04/29/2013, SpO2 100.00%. Examined pt with female chaperone present. Gen: Alert, well appearing.   Patient is oriented to person, place, time, and situation. CV: RRR, no m/r/g.   LUNGS: CTA bilat, nonlabored resps, good aeration in all lung fields. ABD: soft, NT/ND EXT: no clubbing, cyanosis, or edema.  BACK: ROM intact but painful with forward flexion at about 90 deg. Back is nontender.  Skin of right glut area with slight blotchy erythema.  No hypesthesia to this region.   SLR neg bilat.  Strength 5/5 prox and dist bilat.  DTRs 1+ patellar and achilles bilat.    IMPRESSION AND PLAN:  Acute low back pain Myofascial.    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 Heat application discussed. Alleve 2 tabs bid with food prn.   Methocarbamol prn as previously prescribed.  Stretches discussed.   An After Visit Summary was printed and given to the patient.  FOLLOW UP: prn

## 2013-05-22 NOTE — Patient Instructions (Signed)
Take alleve 220mg , 2 tabs twice a day with food. Take methocarbamol (previously prescribed) for muscle relaxation if needed. Apply heat 5-6 times per day.   Keep an eye out for shingles rash.

## 2013-06-11 ENCOUNTER — Other Ambulatory Visit (INDEPENDENT_AMBULATORY_CARE_PROVIDER_SITE_OTHER): Payer: BC Managed Care – PPO

## 2013-06-11 DIAGNOSIS — R030 Elevated blood-pressure reading, without diagnosis of hypertension: Secondary | ICD-10-CM

## 2013-06-11 LAB — RENAL FUNCTION PANEL
Chloride: 106 mEq/L (ref 96–112)
GFR: 97.51 mL/min (ref >60.00)
Glucose, Bld: 93 mg/dL (ref 70–99)
Phosphorus: 2.9 mg/dL (ref 2.3–4.6)
Potassium: 3.5 mEq/L (ref 3.5–5.1)
Sodium: 136 mEq/L (ref 135–145)

## 2013-06-11 LAB — CBC
MCV: 91.7 fl (ref 78.0–100.0)
Platelets: 273 10*3/uL (ref 150.0–400.0)
RBC: 4.34 Mil/uL (ref 3.87–5.11)
WBC: 9.1 10*3/uL (ref 4.5–10.5)

## 2013-06-11 LAB — HEPATIC FUNCTION PANEL
ALT: 27 U/L (ref 0–35)
Albumin: 3.5 g/dL (ref 3.5–5.2)
Bilirubin, Direct: 0 mg/dL (ref 0.0–0.3)
Total Protein: 7.3 g/dL (ref 6.0–8.3)

## 2013-06-11 LAB — LIPID PANEL
Cholesterol: 199 mg/dL (ref 0–200)
HDL: 37.5 mg/dL — ABNORMAL LOW (ref >39.00)
LDL Cholesterol: 143 mg/dL — ABNORMAL HIGH (ref 0–99)
VLDL: 18.8 mg/dL (ref 0.0–40.0)

## 2013-06-11 LAB — TSH: TSH: 1.2 u[IU]/mL (ref 0.35–5.50)

## 2013-06-11 NOTE — Progress Notes (Signed)
Labs only

## 2013-06-12 NOTE — Progress Notes (Signed)
Quick Note:  Patient Informed and voiced understanding ______ 

## 2013-07-06 DIAGNOSIS — M545 Low back pain: Secondary | ICD-10-CM | POA: Insufficient documentation

## 2013-07-06 NOTE — Assessment & Plan Note (Signed)
Myofascial.    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 Heat application discussed. Alleve 2 tabs bid with food prn.   Methocarbamol prn as previously prescribed.  Stretches discussed.

## 2013-07-20 ENCOUNTER — Other Ambulatory Visit: Payer: Self-pay | Admitting: Family Medicine

## 2013-07-20 NOTE — Telephone Encounter (Signed)
Rx request to pharmacy/SLS  

## 2013-08-18 ENCOUNTER — Ambulatory Visit (INDEPENDENT_AMBULATORY_CARE_PROVIDER_SITE_OTHER): Payer: BC Managed Care – PPO | Admitting: Family Medicine

## 2013-08-18 ENCOUNTER — Encounter: Payer: Self-pay | Admitting: Family Medicine

## 2013-08-18 VITALS — BP 140/82 | HR 72 | Temp 98.1°F | Ht 65.75 in | Wt 221.1 lb

## 2013-08-18 DIAGNOSIS — Z23 Encounter for immunization: Secondary | ICD-10-CM

## 2013-08-18 DIAGNOSIS — R5381 Other malaise: Secondary | ICD-10-CM

## 2013-08-18 DIAGNOSIS — R51 Headache: Secondary | ICD-10-CM

## 2013-08-18 DIAGNOSIS — M549 Dorsalgia, unspecified: Secondary | ICD-10-CM

## 2013-08-18 DIAGNOSIS — Z0000 Encounter for general adult medical examination without abnormal findings: Secondary | ICD-10-CM

## 2013-08-18 DIAGNOSIS — N824 Other female intestinal-genital tract fistulae: Secondary | ICD-10-CM

## 2013-08-18 DIAGNOSIS — R030 Elevated blood-pressure reading, without diagnosis of hypertension: Secondary | ICD-10-CM

## 2013-08-18 DIAGNOSIS — E663 Overweight: Secondary | ICD-10-CM

## 2013-08-18 DIAGNOSIS — M533 Sacrococcygeal disorders, not elsewhere classified: Secondary | ICD-10-CM

## 2013-08-18 DIAGNOSIS — H8110 Benign paroxysmal vertigo, unspecified ear: Secondary | ICD-10-CM

## 2013-08-18 DIAGNOSIS — N823 Fistula of vagina to large intestine: Secondary | ICD-10-CM

## 2013-08-18 LAB — RENAL FUNCTION PANEL
BUN: 10 mg/dL (ref 6–23)
CO2: 27 mEq/L (ref 19–32)
Calcium: 9.4 mg/dL (ref 8.4–10.5)
Creat: 0.72 mg/dL (ref 0.50–1.10)
Glucose, Bld: 95 mg/dL (ref 70–99)
Phosphorus: 2.8 mg/dL (ref 2.3–4.6)
Potassium: 3.9 mEq/L (ref 3.5–5.3)
Sodium: 138 mEq/L (ref 135–145)

## 2013-08-18 LAB — CBC
HCT: 39.8 % (ref 36.0–46.0)
Hemoglobin: 14 g/dL (ref 12.0–15.0)
MCHC: 35.2 g/dL (ref 30.0–36.0)
MCV: 89.2 fL (ref 78.0–100.0)
RDW: 12.2 % (ref 11.5–15.5)
WBC: 9.8 10*3/uL (ref 4.0–10.5)

## 2013-08-18 MED ORDER — MECLIZINE HCL 25 MG PO TABS: 25.0000 mg | ORAL_TABLET | Freq: Three times a day (TID) | ORAL | Status: DC | PRN

## 2013-08-18 MED ORDER — METHOCARBAMOL 500 MG PO TABS: 500.0000 mg | ORAL_TABLET | Freq: Two times a day (BID) | ORAL | Status: DC | PRN

## 2013-08-18 NOTE — Assessment & Plan Note (Addendum)
Reviewed annual labs, encouraged heart healthy diet. Given Tdap and flu shot today

## 2013-08-18 NOTE — Progress Notes (Signed)
Patient ID: Erika Weaver, female   DOB: [birth year], 37 y.o.   MRN: [id number] Erika Weaver [id number] November 18, 1975 08/18/2013      Progress Note-Follow Up  Subjective  Chief Complaint  Chief Complaint  Patient presents with  . Annual Exam    physical  . Injections    tdap and flu    HPI  Patient is a 37 year old female in today for annual exam. She's generally feeling well although she has noted some mild disequilibrium for the last couple of weeks. She's had trouble in the past but it has been more persistent this time she has a slight sensation of feeling lightheaded. Low-grade headaches have occurred but these or not it'll. No head trauma. No fevers or chills. Has had some low-grade allergies and congestion. No fevers or chills. No cough, sore throat or pain. No vision or hearing changes. No other neurologic complaints. Taking medications as prescribed. No GI or GU complaints at this time.  Past Medical History  Diagnosis Date  . Chicken pox as a child  . Overweight(278.02)   . Headache(784.0) 11/22/2011  . Allergic state 11/22/2011  . Visual changes 11/22/2011  . Elevated BP 11/22/2011  . Anxiety 11/22/2011  . Sinusitis 11/22/2011  . Plantar fasciitis 01/11/2012  . Fatigue 01/11/2012  . Pharyngitis 01/11/2012  . Sleep apnea 04/03/2012    setting 2  . Chronic kidney disease     kidney stones  . Hypertension   . Kidney stone 12/21/2012  . Back pain 01/31/2013  . 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 Dehydration 01/31/2013    Past Surgical History  Procedure Laterality Date  . Gum surgery    . Wisdom tooth extraction    . Perineal body repair      4 th degree tear with fistula between vagina and retum requiring 3 surgeries for correction    Family History  Problem Relation Age of Onset  . Diabetes Mother     type 2  . Hypertension Mother   . Hyperlipidemia Mother   . Hypertension Father   . Hyperlipidemia Father   . Aneurysm Father     abdominal aorta  . Cancer  Maternal Grandmother 49    colon  . Other Maternal Grandfather 41    brain hemorrhage  . Stroke Maternal Grandfather   . Aneurysm Maternal Grandfather   . Heart attack Paternal Grandmother   . Heart disease Paternal Grandmother     MI  . Alzheimer's disease Paternal Grandfather   . Dementia Paternal Grandfather   . Heart attack Paternal Aunt   . Heart disease Paternal Aunt     MI  . Other Paternal Uncle     cerebreal brain hemorrhage  . Stroke Paternal Uncle   . Aneurysm Paternal Uncle   . Asthma Daughter   . Other Daughter     peanut allergy  . Aneurysm Maternal Uncle 60    coiled, behind eye    History   Social History  . Marital Status: Married    Spouse Name: N/A    Number of Children: N/A  . Years of Education: N/A   Occupational History  . Not on file.   Social History Main Topics  . Smoking status: Never Smoker   . Smokeless tobacco: Never Used  . Alcohol Use: No  . Drug Use: No  . Sexual Activity: Not on file   Other Topics Concern  . Not on file   Social History Narrative  . No narrative on  file    Current Outpatient Prescriptions on File Prior to Visit  Medication Sig Dispense Refill  . metoprolol succinate (TOPROL-XL) 25 MG 24 hr tablet TAKE 1 TABLET BY MOUTH EVERY DAY*TRY TO TAKE MID MORNING*  30 tablet  0   No current facility-administered medications on file prior to visit.    No Known Allergies  Review of Systems  Review of Systems  Constitutional: Negative for fever and malaise/fatigue.  HENT: Negative for congestion.   Eyes: Negative for discharge.  Respiratory: Negative for shortness of breath.   Cardiovascular: Negative for chest pain, palpitations and leg swelling.  Gastrointestinal: Negative for nausea, abdominal pain and diarrhea.  Genitourinary: Negative for dysuria.  Musculoskeletal: Negative for falls.  Skin: Negative for rash.  Neurological: Negative for loss of consciousness and headaches.  Endo/Heme/Allergies:  Negative for polydipsia.  Psychiatric/Behavioral: Negative for depression and suicidal ideas. The patient is not nervous/anxious and does not have insomnia.     Objective  BP 140/82  Pulse 72  Temp(Src) 98.1 F (36.7 C) (Oral)  Ht 5' 5.75" (1.67 m)  Wt 221 lb 1.3 oz (100.281 kg)  BMI 35.96 kg/m2  SpO2 99%  LMP 08/01/2013  Physical Exam  Physical Exam  Constitutional: She is oriented to person, place, and time and well-developed, well-nourished, and in no distress. No distress.  HENT:  Head: Normocephalic and atraumatic.  Eyes: Conjunctivae are normal.  Neck: Neck supple. No thyromegaly present.  Cardiovascular: Normal rate, regular rhythm and normal heart sounds.   No murmur heard. Pulmonary/Chest: Effort normal and breath sounds normal. She has no wheezes.  Abdominal: She exhibits no distension and no mass.  Musculoskeletal: She exhibits no edema.  Lymphadenopathy:    She has no cervical adenopathy.  Neurological: She is alert and oriented to person, place, and time.  Skin: Skin is warm and dry. No rash noted. She is not diaphoretic.  Psychiatric: Memory, affect and judgment normal.    Lab Results  Component Value Date   TSH 1.20 06/11/2013   Lab Results  Component Value Date   WBC 9.1 06/11/2013   HGB 13.4 06/11/2013   HCT 39.8 06/11/2013   MCV 91.7 06/11/2013   PLT 273.0 06/11/2013   Lab Results  Component Value Date   CREATININE 0.7 06/11/2013   BUN 8 06/11/2013   NA 136 06/11/2013   K 3.5 06/11/2013   CL 106 06/11/2013   CO2 25 06/11/2013   Lab Results  Component Value Date   ALT 27 06/11/2013   AST 23 06/11/2013   ALKPHOS 65 06/11/2013   BILITOT 0.7 06/11/2013   Lab Results  Component Value Date   CHOL 199 06/11/2013   Lab Results  Component Value Date   HDL 37.50* 06/11/2013   Lab Results  Component Value Date   LDLCALC 143* 06/11/2013   Lab Results  Component Value Date   TRIG 94.0 06/11/2013   Lab Results  Component Value Date   CHOLHDL 5  06/11/2013     Assessment & Plan  Rectal vaginal fistula Dr Noland Fordyce at [facility] has repaired x twice along with cyst removed, now with collagen plug, last surgery May 2014  Preventative health care Reviewed annual labs, encouraged heart healthy diet. Given Tdap and flu shot today  Elevated BP Borderline encouraged minimize sodium, increase exercise  Overweight Encouraged DASH diet, continue exercise and incrase as tolerated.  BPV (benign positional vertigo) Encouraged adequate hydration, report worse symptoms, given Meclizine to use prn.  Back pain Given rx for  Robaxin to use prn

## 2013-08-18 NOTE — Assessment & Plan Note (Signed)
Dr Noland Fordyce at [facility] has repaired x twice along with cyst removed, now with collagen plug, last surgery May 2014

## 2013-08-18 NOTE — Patient Instructions (Signed)
Sacroiliac Joint Dysfunction The sacroiliac joint connects the lower part of the spine (the sacrum) with the bones of the pelvis. CAUSES  Sometimes, there is no obvious reason for sacroiliac joint dysfunction. Other times, it may occur   During pregnancy.  After injury, such as:  Car accidents.  Sport-related injuries.  Work-related injuries.  Due to one leg being shorter than the other.  Due to other conditions that affect the joints, such as:  Rheumatoid arthritis.  Gout.  Psoriasis.  Joint infection (septic arthritis). SYMPTOMS  Symptoms may include:  Pain in the:  Lower back.  Buttocks.  Groin.  Thighs and legs.  Difficult sitting, standing, walking, lying, bending or lifting. DIAGNOSIS  A number of tests may be used to help diagnose the cause of sacroiliac joint dysfunction, including:  Imaging tests to look for other causes of pain, including:  MRI.  CT scan.  Bone scan.  Diagnostic injection: During a special x-ray (called fluoroscopy), a needle is put into the sacroiliac joint. A numbing medicine is injected into the joint. If the pain is improved or stopped, the diagnosis of sacroiliac joint dysfunction is more likely. TREATMENT  There are a number of types of treatment used for sacroiliac joint dysfunction, including:  Only take over-the-counter or prescription medicines for pain, discomfort, or fever as directed by your caregiver.  Medications to relax muscles.  Rest. Decreasing activity can help cut down on painful muscle spasms and allow the back to heal.  Application of heat or ice to the lower back may improve muscle spasms and soothe pain.  Brace. A special back brace, called a sacroiliac belt, can help support the joint while your back is healing.  Physical therapy can help teach comfortable positions and exercises to strengthen muscles that support the sacroiliac joint.  Cortisone injections. Injections of steroid medicine into the  joint can help decrease swelling and improve pain.  Hyaluronic acid injections. This chemical improves lubrication within the sacroiliac joint, thereby decreasing pain.  Radiofrequency ablation. A special needle is placed into the joint, where it burns away nerves that are carrying pain messages from the joint.  Surgery. Because pain occurs during movement of the joint, screws and plates may be installed in order to limit or prevent joint motion. HOME CARE INSTRUCTIONS   Take all medications exactly as directed.  Follow instructions regarding both rest and physical activity, to avoid worsening the pain.  Do physical therapy exercises exactly as prescribed. SEEK IMMEDIATE MEDICAL CARE IF:  You experience increasingly severe pain.  You develop new symptoms, such as numbness or tingling in your legs or feet.  You lose bladder or bowel control. Document Released: 01/04/2009 Document Revised: 12/31/2011 Document Reviewed: 01/04/2009 [facility] Patient Information 2014 [place], [place]. Benign Positional Vertigo Vertigo means you feel like you or your surroundings are moving when they are not. Benign positional vertigo is the most common form of vertigo. Benign means that the cause of your condition is not serious. Benign positional vertigo is more common in older adults. CAUSES  Benign positional vertigo is the result of an upset in the labyrinth system. This is an area in the middle ear that helps control your balance. This may be caused by a viral infection, head injury, or repetitive motion. However, often no specific cause is found. SYMPTOMS  Symptoms of benign positional vertigo occur when you move your head or eyes in different directions. Some of the symptoms may include:  Loss of balance and falls.  Vomiting.  Blurred  vision.  Dizziness.  Nausea.  Involuntary eye movements (nystagmus). DIAGNOSIS  Benign positional vertigo is usually diagnosed by physical exam. If the  specific cause of your benign positional vertigo is unknown, your caregiver may perform imaging tests, such as magnetic resonance imaging (MRI) or computed tomography (CT). TREATMENT  Your caregiver may recommend movements or procedures to correct the benign positional vertigo. Medicines such as meclizine, benzodiazepines, and medicines for nausea may be used to treat your symptoms. In rare cases, if your symptoms are caused by certain conditions that affect the inner ear, you may need surgery. HOME CARE INSTRUCTIONS   Follow your caregiver's instructions.  Move slowly. Do not make sudden body or head movements.  Avoid driving.  Avoid operating heavy machinery.  Avoid performing any tasks that would be dangerous to you or others during a vertigo episode.  Drink enough fluids to keep your urine clear or pale yellow. SEEK IMMEDIATE MEDICAL CARE IF:   You develop problems with walking, weakness, numbness, or using your arms, hands, or legs.  You have difficulty speaking.  You develop severe headaches.  Your nausea or vomiting continues or gets worse.  You develop visual changes.  Your family or friends notice any behavioral changes.  Your condition gets worse.  You have a fever.  You develop a stiff neck or sensitivity to light. MAKE SURE YOU:   Understand these instructions.  Will watch your condition.  Will get help right away if you are not doing well or get worse. Document Released: 07/16/2006 Document Revised: 12/31/2011 Document Reviewed: 06/28/2011 [facility] Patient Information 2014 [place], [place].

## 2013-08-21 ENCOUNTER — Telehealth: Payer: Self-pay

## 2013-08-21 NOTE — Telephone Encounter (Signed)
Message copied by Eulis Manly on Fri Aug 21, 2013  4:22 PM ------      Message from: Danise Edge A      Created: Tue Aug 18, 2013  6:50 PM       Notify labs normal ------

## 2013-08-21 NOTE — Telephone Encounter (Signed)
Patient informed of results.  

## 2013-08-23 ENCOUNTER — Encounter: Payer: Self-pay | Admitting: Family Medicine

## 2013-08-23 DIAGNOSIS — H8110 Benign paroxysmal vertigo, unspecified ear: Secondary | ICD-10-CM

## 2013-08-23 HISTORY — DX: Benign paroxysmal vertigo, unspecified ear: H81.10

## 2013-08-23 NOTE — Assessment & Plan Note (Signed)
Borderline encouraged minimize sodium, increase exercise

## 2013-08-23 NOTE — Assessment & Plan Note (Signed)
Encouraged adequate hydration, report worse symptoms, given Meclizine to use prn.

## 2013-08-23 NOTE — Assessment & Plan Note (Signed)
Encouraged DASH diet, continue exercise and incrase as tolerated.

## 2013-08-23 NOTE — Assessment & Plan Note (Signed)
Given rx for Robaxin to use prn

## 2013-08-24 ENCOUNTER — Other Ambulatory Visit: Payer: Self-pay | Admitting: Family Medicine

## 2013-08-31 ENCOUNTER — Telehealth: Payer: Self-pay | Admitting: *Deleted

## 2013-08-31 NOTE — Telephone Encounter (Signed)
Patient called inquiring about taking the next step to take RE: Lightheadedness & dizziness, reports that medication given has made issue worse; states that ENT referral was discussed at last OV/SLS Please Advise.

## 2013-09-01 ENCOUNTER — Other Ambulatory Visit: Payer: Self-pay | Admitting: Family Medicine

## 2013-09-01 DIAGNOSIS — H8110 Benign paroxysmal vertigo, unspecified ear: Secondary | ICD-10-CM

## 2013-09-01 DIAGNOSIS — T7840XD Allergy, unspecified, subsequent encounter: Secondary | ICD-10-CM

## 2013-09-01 NOTE — Telephone Encounter (Signed)
The other option we discussed is neurology. Is she having any persistent congestion or URI symptoms, if so I will refer to ENT if no will refer to neurology

## 2013-09-01 NOTE — Telephone Encounter (Signed)
Spoke with pt. She states she currently has sinus drainage and productive. Has stopped meclizine. Has started [facility] brand mucus relief. Pt is agreeable to proceed with ENT referral and would like to try Dr Pollyann Kennedy at [facility].

## 2013-09-16 ENCOUNTER — Ambulatory Visit (INDEPENDENT_AMBULATORY_CARE_PROVIDER_SITE_OTHER): Payer: BC Managed Care – PPO | Admitting: Family Medicine

## 2013-09-16 ENCOUNTER — Encounter: Payer: Self-pay | Admitting: Family Medicine

## 2013-09-16 VITALS — BP 137/96 | HR 104 | Temp 98.8°F | Ht 65.75 in | Wt 216.2 lb

## 2013-09-16 DIAGNOSIS — J020 Streptococcal pharyngitis: Secondary | ICD-10-CM | POA: Insufficient documentation

## 2013-09-16 MED ORDER — AMOXICILLIN 875 MG PO TABS: 875.0000 mg | ORAL_TABLET | Freq: Two times a day (BID) | ORAL | Status: DC

## 2013-09-16 NOTE — Patient Instructions (Signed)
Take 1000 mg tylenol every 6 hours OR 600 mg ibuprofen every 6 hours ---as needed for pain/soreness. Rest and drink clear fluids.

## 2013-09-16 NOTE — Progress Notes (Signed)
Pre-visit discussion using our clinic review tool. No additional management support is needed unless otherwise documented below in the visit note.  OFFICE NOTE  09/16/2013  CC:  Chief Complaint  Patient presents with  . Sore Throat     HPI: Patient is a 37 y.o. Caucasian female who is here for sore throat.   Onset this morning of ST, HA, achy all over.  Some nasal congestion with PND and slight cough. No fever.  No rash.  Took OTC mucous relief med this morning--no help.  No wheezing or SOB. No n/v/abd pain.  Pertinent PMH:  Past Medical History  Diagnosis Date  . Chicken pox as a child  . Overweight(278.02)   . Headache(784.0) 11/22/2011  . Allergic state 11/22/2011  . Visual changes 11/22/2011  . Elevated BP 11/22/2011  . Anxiety 11/22/2011  . Sinusitis 11/22/2011  . Plantar fasciitis 01/11/2012  . Fatigue 01/11/2012  . Pharyngitis 01/11/2012  . Sleep apnea 04/03/2012    setting 2  . Chronic kidney disease     kidney stones  . Hypertension   . Kidney stone 12/21/2012  . Back pain 01/31/2013  . 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 Dehydration 01/31/2013  . Preventative health care 08/18/2013  . BPV (benign positional vertigo) 08/23/2013   Past surgical, social, and family history reviewed and no changes noted since last office visit.  MEDS:  Outpatient Prescriptions Prior to Visit  Medication Sig Dispense Refill  . KRILL OIL PO Take by mouth.      . metoprolol succinate (TOPROL-XL) 25 MG 24 hr tablet TAKE 1 TABLET BY MOUTH EVERY DAY*TRY TO TAKE MID MORNING*  30 tablet  0  . meclizine (ANTIVERT) 25 MG tablet Take 1 tablet (25 mg total) by mouth 3 (three) times daily as needed.  30 tablet  0  . methocarbamol (ROBAXIN) 500 MG tablet Take 1 tablet (500 mg total) by mouth 2 (two) times daily as needed. Hip pain, muscle strain  30 tablet  1   No facility-administered medications prior to visit.    PE: Blood pressure 137/96, pulse 104, temperature 98.8 F (37.1 C), temperature  source Temporal, height 5' 5.75" (1.67 m), weight 216 lb 4 oz (98.09 kg), last menstrual period 08/01/2013, SpO2 99.00%. VS: noted--normal. Gen: alert, NAD, NONTOXIC APPEARING. HEENT: eyes without injection, drainage, or swelling.  Ears: EACs clear, TMs with normal light reflex and landmarks.  Nose: Clear rhinorrhea, with some dried, crusty exudate adherent to mildly injected mucosa.  No purulent d/c.  No paranasal sinus TTP.  No facial swelling.  Throat and mouth without focal lesion.  Mild diffuse tonsillar, soft palatal, and posterior pharyngeal erythema and swelling.  No exudate. Neck: supple, very mild LAD--slightly tender diffusely in submandib/ant cerv region..   LUNGS: CTA bilat, nonlabored resps.   CV: RRR, no m/r/g. EXT: no c/c/e SKIN: no rash  LAB: rapid strep POSITIVE today  IMPRESSION AND PLAN:  Pharyngitis, streptococcal, acute Amoxil 875mg  bid x 10d. Symptomatic care, rest, clear fluids discussed.   An After Visit Summary was printed and given to the patient.  FOLLOW UP: prn

## 2013-09-16 NOTE — Assessment & Plan Note (Signed)
Amoxil 875mg  bid x 10d. Symptomatic care, rest, clear fluids discussed.

## 2013-10-02 ENCOUNTER — Telehealth: Payer: Self-pay | Admitting: Family Medicine

## 2013-10-02 ENCOUNTER — Other Ambulatory Visit: Payer: Self-pay | Admitting: Family Medicine

## 2013-10-02 DIAGNOSIS — R42 Dizziness and giddiness: Secondary | ICD-10-CM

## 2013-10-02 NOTE — Telephone Encounter (Signed)
I will set up neurologic consultation for disequilibrium and h/o headache

## 2013-10-02 NOTE — Telephone Encounter (Signed)
Please advise 

## 2013-10-02 NOTE — Telephone Encounter (Signed)
She has been light headed and dizzy for 2 months.  Dr B sent her to ent and they ruled out inner ear.  Took her bp  Standing up sitting up and laying  down .   She has an eye appointment after christmas.  Should she go to the neurologist

## 2013-10-02 NOTE — Telephone Encounter (Signed)
Patient informed and voiced understanding

## 2013-10-04 ENCOUNTER — Other Ambulatory Visit: Payer: Self-pay | Admitting: Family Medicine

## 2013-10-05 NOTE — Telephone Encounter (Signed)
Metoprolol refilled per protocol. JG//CMA 

## 2013-10-08 NOTE — Telephone Encounter (Signed)
PATIENT CALLING IN TODAY TO SAY SHE IS FEELING WORSE.  SHE IS LIGHT HEADED DIZZY HER VISION IS OFF AND SHE HAS SOME PAIN BEHIND HER LEFT EYE.   SHE HAS AN APPOINTMENT WITH THE EYE DOCTOR ON 10-19-2013 AND WITH THE NEUROLOGIST ON 11-09-2013.  HER MOTHER SAYS THERE IS A HISTORY OF ANEURYSM ON BOTH SIDES OF HER FAMILY.  DOES DR B WANT TO SEE HER AND WHAT CAN SHE SUGGEST FOR THE PATIENT TO DO AT THIS POINT

## 2013-10-08 NOTE — Telephone Encounter (Signed)
So reviewed her chart. Please advise her if she gets worse to go to ED. I am willing to stay late to see her at 11:45 tomorrow or could see her early at 7:30 by coming in early beyond that I cannot order extensive imaging without a visit or insurance will not cover it. Remind her dehydration is a major component for a great deal of headaches and light headedness. Remind 64 oz of clear fluids and no alcohol or caffeine. Low sugar can also cause this, need small, frequent meals eat roughly every 4 hours with lean proteins, veg and only a small amount of complex carbs

## 2013-10-08 NOTE — Telephone Encounter (Signed)
Left a detailed message on patient vm 

## 2013-10-09 ENCOUNTER — Encounter: Payer: Self-pay | Admitting: Family Medicine

## 2013-10-09 ENCOUNTER — Ambulatory Visit (INDEPENDENT_AMBULATORY_CARE_PROVIDER_SITE_OTHER): Payer: BC Managed Care – PPO | Admitting: Family Medicine

## 2013-10-09 ENCOUNTER — Other Ambulatory Visit: Payer: Self-pay | Admitting: Family Medicine

## 2013-10-09 VITALS — BP 152/98 | HR 67 | Temp 98.1°F | Ht 65.75 in | Wt 217.0 lb

## 2013-10-09 DIAGNOSIS — F419 Anxiety disorder, unspecified: Secondary | ICD-10-CM

## 2013-10-09 DIAGNOSIS — R030 Elevated blood-pressure reading, without diagnosis of hypertension: Secondary | ICD-10-CM

## 2013-10-09 DIAGNOSIS — H8110 Benign paroxysmal vertigo, unspecified ear: Secondary | ICD-10-CM

## 2013-10-09 DIAGNOSIS — F411 Generalized anxiety disorder: Secondary | ICD-10-CM

## 2013-10-09 DIAGNOSIS — R51 Headache: Secondary | ICD-10-CM

## 2013-10-09 DIAGNOSIS — W57XXXA Bitten or stung by nonvenomous insect and other nonvenomous arthropods, initial encounter: Secondary | ICD-10-CM

## 2013-10-09 DIAGNOSIS — T148 Other injury of unspecified body region: Secondary | ICD-10-CM

## 2013-10-09 DIAGNOSIS — I10 Essential (primary) hypertension: Secondary | ICD-10-CM

## 2013-10-09 LAB — RENAL FUNCTION PANEL
Albumin: 3.8 g/dL (ref 3.5–5.2)
BUN: 11 mg/dL (ref 6–23)
CO2: 26 mEq/L (ref 19–32)
Creat: 0.71 mg/dL (ref 0.50–1.10)
Glucose, Bld: 77 mg/dL (ref 70–99)

## 2013-10-09 LAB — CBC
HCT: 40.8 % (ref 36.0–46.0)
Hemoglobin: 14 g/dL (ref 12.0–15.0)
MCH: 30 pg (ref 26.0–34.0)
MCV: 87.6 fL (ref 78.0–100.0)
RBC: 4.66 MIL/uL (ref 3.87–5.11)

## 2013-10-09 LAB — SEDIMENTATION RATE: Sed Rate: 20 mm/hr (ref 0–22)

## 2013-10-09 MED ORDER — METOPROLOL SUCCINATE ER 50 MG PO TB24: 50.0000 mg | ORAL_TABLET | Freq: Every day | ORAL | Status: DC

## 2013-10-09 NOTE — Patient Instructions (Signed)
Hydrate 64 oz + daily Small, frequent meals with lean proteins  Vertigo Vertigo means you feel like you or your surroundings are moving when they are not. Vertigo can be dangerous if it occurs when you are at work, driving, or performing difficult activities.  CAUSES  Vertigo occurs when there is a conflict of signals sent to your brain from the visual and sensory systems in your body. There are many different causes of vertigo, including:  Infections, especially in the inner ear.  A bad reaction to a drug or misuse of alcohol and medicines.  Withdrawal from drugs or alcohol.  Rapidly changing positions, such as lying down or rolling over in bed.  A migraine headache.  Decreased blood flow to the brain.  Increased pressure in the brain from a head injury, infection, tumor, or bleeding. SYMPTOMS  You may feel as though the world is spinning around or you are falling to the ground. Because your balance is upset, vertigo can cause nausea and vomiting. You may have involuntary eye movements (nystagmus). DIAGNOSIS  Vertigo is usually diagnosed by physical exam. If the cause of your vertigo is unknown, your caregiver may perform imaging tests, such as an MRI scan (magnetic resonance imaging). TREATMENT  Most cases of vertigo resolve on their own, without treatment. Depending on the cause, your caregiver may prescribe certain medicines. If your vertigo is related to body position issues, your caregiver may recommend movements or procedures to correct the problem. In rare cases, if your vertigo is caused by certain inner ear problems, you may need surgery. HOME CARE INSTRUCTIONS   Follow your caregiver's instructions.  Avoid driving.  Avoid operating heavy machinery.  Avoid performing any tasks that would be dangerous to you or others during a vertigo episode.  Tell your caregiver if you notice that certain medicines seem to be causing your vertigo. Some of the medicines used to treat  vertigo episodes can actually make them worse in some people. SEEK IMMEDIATE MEDICAL CARE IF:   Your medicines do not relieve your vertigo or are making it worse.  You develop problems with talking, walking, weakness, or using your arms, hands, or legs.  You develop severe headaches.  Your nausea or vomiting continues or gets worse.  You develop visual changes.  A family member notices behavioral changes.  Your condition gets worse. MAKE SURE YOU:  Understand these instructions.  Will watch your condition.  Will get help right away if you are not doing well or get worse. Document Released: 07/18/2005 Document Revised: 12/31/2011 Document Reviewed: 04/26/2011 [facility] Patient Information 2014 [place], [place].

## 2013-10-09 NOTE — Progress Notes (Signed)
Pre visit review using our clinic review tool, if applicable. No additional management support is needed unless otherwise documented below in the visit note. 

## 2013-10-13 ENCOUNTER — Encounter: Payer: Self-pay | Admitting: Family Medicine

## 2013-10-13 NOTE — Assessment & Plan Note (Signed)
Offered reassurance that symptoms not excessively concerning, she will call if symptoms worsen or changes

## 2013-10-13 NOTE — Progress Notes (Signed)
Patient ID: Erika Weaver, female   DOB: 28-Jul-1976, 37 y.o.   MRN: [id number] DEVANY AJA [id number] 02-04-1976 10/13/2013      Progress Note-Follow Up  Subjective  Chief Complaint  Chief Complaint  Patient presents with  . Dizziness    HPI  Caucasian female who is in today concerned about increasing sense of disequilibrium. She had an MRI last year which was normal but she's still concerned family history of aneurysms. She does have headaches but not debilitating once. Has an appointment later this month with ophthalmology and an appointment next month with neurology. Denies any vision changes. Denies any congestion, fevers, chest pain, palpitations or shortness of breath. No GI or GU complaints. She does acknowledge increased level of anxiety recently  Past Medical History  Diagnosis Date  . Chicken pox as a child  . Overweight(278.02)   . Headache(784.0) 11/22/2011  . Allergic state 11/22/2011  . Visual changes 11/22/2011  . Elevated BP 11/22/2011  . Anxiety 11/22/2011  . Sinusitis 11/22/2011  . Plantar fasciitis 01/11/2012  . Fatigue 01/11/2012  . Pharyngitis 01/11/2012  . Sleep apnea 04/03/2012    setting 2  . Chronic kidney disease     kidney stones  . Hypertension   . Kidney stone 12/21/2012  . Back pain 01/31/2013  . 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 Dehydration 01/31/2013  . Preventative health care 08/18/2013  . BPV (benign positional vertigo) 08/23/2013    Past Surgical History  Procedure Laterality Date  . Gum surgery    . Wisdom tooth extraction    . Perineal body repair      4 th degree tear with fistula between vagina and retum requiring 3 surgeries for correction    Family History  Problem Relation Age of Onset  . Diabetes Mother     type 2  . Hypertension Mother   . Hyperlipidemia Mother   . Hypertension Father   . Hyperlipidemia Father   . Aneurysm Father     abdominal aorta  . Cancer Maternal Grandmother 62    colon, ovarian  . Other  Maternal Grandfather 41    brain hemorrhage  . Stroke Maternal Grandfather   . Aneurysm Maternal Grandfather   . Heart attack Paternal Grandmother   . Heart disease Paternal Grandmother     MI  . Alzheimer's disease Paternal Grandfather   . Dementia Paternal Grandfather   . Heart attack Paternal Aunt   . Heart disease Paternal Aunt      AAA rupture  . Other Paternal Uncle     cerebreal brain hemorrhage  . Stroke Paternal Uncle   . Aneurysm Paternal Uncle   . Asthma Daughter   . Other Daughter     peanut allergy  . Heart disease Maternal Aunt     s/p cabg. brain aneurysm s/p coiling  . Aneurysm Maternal Aunt     coil behind eye    History   Social History  . Marital Status: Married    Spouse Name: N/A    Number of Children: N/A  . Years of Education: N/A   Occupational History  . Not on file.   Social History Main Topics  . Smoking status: Never Smoker   . Smokeless tobacco: Never Used  . Alcohol Use: No  . Drug Use: No  . Sexual Activity: Yes    Partners: Male    Birth Control/ Protection: None     Comment: lives with husband, daughter, no dietary restrictions   Other  Topics Concern  . Not on file   Social History Narrative  . No narrative on file    Current Outpatient Prescriptions on File Prior to Visit  Medication Sig Dispense Refill  . amoxicillin (AMOXIL) 875 MG tablet Take 1 tablet (875 mg total) by mouth 2 (two) times daily.  20 tablet  0  . KRILL OIL PO Take by mouth.      . meclizine (ANTIVERT) 25 MG tablet Take 1 tablet (25 mg total) by mouth 3 (three) times daily as needed.  30 tablet  0  . methocarbamol (ROBAXIN) 500 MG tablet Take 1 tablet (500 mg total) by mouth 2 (two) times daily as needed. Hip pain, muscle strain  30 tablet  1   No current facility-administered medications on file prior to visit.    No Known Allergies  Review of Systems  Review of Systems  Constitutional: Positive for malaise/fatigue. Negative for fever.  HENT:  Negative for congestion and hearing loss.   Eyes: Negative for blurred vision, double vision, pain and discharge.  Respiratory: Negative for shortness of breath.   Cardiovascular: Negative for chest pain, palpitations and leg swelling.  Gastrointestinal: Negative for nausea, abdominal pain and diarrhea.  Genitourinary: Negative for dysuria.  Musculoskeletal: Negative for falls.  Skin: Negative for rash.  Neurological: Positive for dizziness and headaches. Negative for tingling and loss of consciousness.  Endo/Heme/Allergies: Negative for polydipsia.  Psychiatric/Behavioral: Negative for depression and suicidal ideas. The patient is not nervous/anxious and does not have insomnia.     Objective  BP 152/98  Pulse 67  Temp(Src) 98.1 F (36.7 C) (Oral)  Ht 5' 5.75" (1.67 m)  Wt 217 lb (98.431 kg)  BMI 35.29 kg/m2  SpO2 98%  LMP 09/28/2013  Physical Exam  Physical Exam  Constitutional: She is oriented to person, place, and time and well-developed, well-nourished, and in no distress. No distress.  HENT:  Head: Normocephalic and atraumatic.  Eyes: Conjunctivae are normal.  Neck: Neck supple. No thyromegaly present.  Cardiovascular: Normal rate, regular rhythm and normal heart sounds.   No murmur heard. Pulmonary/Chest: Effort normal and breath sounds normal. She has no wheezes.  Abdominal: She exhibits no distension and no mass.  Musculoskeletal: She exhibits no edema.  Lymphadenopathy:    She has no cervical adenopathy.  Neurological: She is alert and oriented to person, place, and time. She has normal reflexes. She displays normal reflexes. No cranial nerve deficit. Gait normal. Coordination normal. GCS score is 15.  Skin: Skin is warm and dry. No rash noted. She is not diaphoretic.  Psychiatric: Memory, affect and judgment normal.    Lab Results  Component Value Date   TSH 1.354 10/09/2013   Lab Results  Component Value Date   WBC 9.1 10/09/2013   HGB 14.0 10/09/2013    HCT 40.8 10/09/2013   MCV 87.6 10/09/2013   PLT 349 10/09/2013   Lab Results  Component Value Date   CREATININE 0.71 10/09/2013   BUN 11 10/09/2013   NA 137 10/09/2013   K 4.4 10/09/2013   CL 103 10/09/2013   CO2 26 10/09/2013   Lab Results  Component Value Date   ALT 27 06/11/2013   AST 23 06/11/2013   ALKPHOS 65 06/11/2013   BILITOT 0.7 06/11/2013   Lab Results  Component Value Date   CHOL 199 06/11/2013   Lab Results  Component Value Date   HDL 37.50* 06/11/2013   Lab Results  Component Value Date   LDLCALC 143* 06/11/2013  Lab Results  Component Value Date   TRIG 94.0 06/11/2013   Lab Results  Component Value Date   CHOLHDL 5 06/11/2013     Assessment & Plan  Elevated BP Increase Metoprolol to 50 mg daily  Headache Lyme and RMSF titers negative after tick bite. Discussed need for increased hydration and small frequent meals with lean proteins and increased rest.   BPV (benign positional vertigo) Increase hydration given rx for Meclizine prn  Anxiety Offered reassurance that symptoms not excessively concerning, she will call if symptoms worsen or changes

## 2013-10-13 NOTE — Assessment & Plan Note (Signed)
Increase hydration given rx for Meclizine prn

## 2013-10-13 NOTE — Assessment & Plan Note (Signed)
Increase Metoprolol to 50 mg daily

## 2013-10-13 NOTE — Assessment & Plan Note (Signed)
Lyme and RMSF titers negative after tick bite. Discussed need for increased hydration and small frequent meals with lean proteins and increased rest.

## 2013-10-29 ENCOUNTER — Ambulatory Visit: Payer: BC Managed Care – PPO | Admitting: Family Medicine

## 2013-11-03 ENCOUNTER — Ambulatory Visit (INDEPENDENT_AMBULATORY_CARE_PROVIDER_SITE_OTHER): Payer: BC Managed Care – PPO | Admitting: Neurology

## 2013-11-03 ENCOUNTER — Encounter: Payer: Self-pay | Admitting: Neurology

## 2013-11-03 VITALS — BP 112/88 | HR 64 | Resp 14 | Ht 65.0 in | Wt 217.3 lb

## 2013-11-03 DIAGNOSIS — G44209 Tension-type headache, unspecified, not intractable: Secondary | ICD-10-CM

## 2013-11-03 DIAGNOSIS — R42 Dizziness and giddiness: Secondary | ICD-10-CM

## 2013-11-03 NOTE — Patient Instructions (Signed)
I don't think your symptoms are neurological.  Your exam is normal, which is reassuring.  I don't think we need to check for any brain aneurysms, as these symptoms are not typical and usually you screen people with at least 2 first degree relatives with history of brain aneurysms.  I would follow up with [name].  Call with questions or concerns.

## 2013-11-03 NOTE — Progress Notes (Signed)
NEUROLOGY FOLLOW UP OFFICE NOTE  Erika Weaver [id number]  HISTORY OF PRESENT ILLNESS: Erika Weaver is a 38 year old right-handed woman with OSA on CPAP and anxiety who presents for dizziness.  She was previously seen by [name], who has since left [facility].  Records and images were personally reviewed where available.   She has had these episodes off and on for several years.  She describes a constant lightheadedness and dizziness, meaning a sensation of "wooziness" but not spinning or sensation of movement.  It is not positional.  There is no nausea, aural fullness, tinnitus, facial numbness, or visual disturbance.  She has not had problems ambulating and it is not debilitating to the point where she cannot function.  One time, she felt like she may fall due to lightheadedness, but caught herself.  She occasionally has a headache, which is not necessarily associated with this.  It is holocephalic, non-throbbing and not associated with other symptoms such as nausea.  She usually takes Advil, which helps.  This current dizzy episode has been ongoing for a little over 2 months, which is the typical duration of these spells.  Her last episode occurred two years ago.  At that time, she saw [name] for these symptoms in 2013.  MRI of brain with and without contrast performed on 01/03/12 was normal.  She reported  a family history of intracranial aneurysms with her maternal aunt and paternal great aunt.  Her father has an abdominal aortic aneurysm.  An MRA of the head was ordered in 2013 but never performed, because it was denied by insurance.  She has been evaluated by ENT and had her vision checked, both unremarkable findings.  [name] considered that it may be blood pressure related and recently had her anti-hypertensive increased.  Orthostatics have been negative.  Incidentally, she had a tick bite last June.  Lab work performed on 10/09/13 included Lyme titer negative, TSH  1.354, ESR 20, [place] Spotted Fever IgG negative.  PAST MEDICAL HISTORY: Past Medical History  Diagnosis Date  . Chicken pox as a child  . Overweight   . Headache(784.0) 11/22/2011  . Allergic state 11/22/2011  . Visual changes 11/22/2011  . Elevated BP 11/22/2011  . Anxiety 11/22/2011  . Sinusitis 11/22/2011  . Plantar fasciitis 01/11/2012  . Fatigue 01/11/2012  . Pharyngitis 01/11/2012  . Sleep apnea 04/03/2012    setting 2  . Chronic kidney disease     kidney stones  . Hypertension   . Kidney stone 12/21/2012  . Back pain 01/31/2013  . 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 Dehydration 01/31/2013  . Preventative health care 08/18/2013  . BPV (benign positional vertigo) 08/23/2013    MEDICATIONS: Current Outpatient Prescriptions on File Prior to Visit  Medication Sig Dispense Refill  . metoprolol succinate (TOPROL-XL) 50 MG 24 hr tablet Take 1 tablet (50 mg total) by mouth daily. Take with or immediately following a meal.  90 tablet  3   No current facility-administered medications on file prior to visit.    ALLERGIES: No Known Allergies  FAMILY HISTORY: Family History  Problem Relation Age of Onset  . Diabetes Mother     type 2  . Hypertension Mother   . Hyperlipidemia Mother   . Hypertension Father   . Hyperlipidemia Father   . Aneurysm Father     abdominal aorta  . Cancer Maternal Grandmother 82    colon, ovarian  . Other Maternal Grandfather  41    brain hemorrhage  . Stroke Maternal Grandfather   . Aneurysm Maternal Grandfather   . Heart attack Paternal Grandmother   . Heart disease Paternal Grandmother     MI  . Alzheimer's disease Paternal Grandfather   . Dementia Paternal Grandfather   . Heart attack Paternal Aunt   . Heart disease Paternal Aunt      AAA rupture  . Other Paternal Uncle     cerebreal brain hemorrhage  . Stroke Paternal Uncle   . Aneurysm Paternal Uncle   . Asthma Daughter   . Other Daughter     peanut allergy  . Heart disease  Maternal Aunt     s/p cabg. brain aneurysm s/p coiling  . Aneurysm Maternal Aunt     coil behind eye    SOCIAL HISTORY: History   Social History  . Marital Status: Married    Spouse Name: N/A    Number of Children: N/A  . Years of Education: N/A   Occupational History  . Not on file.   Social History Main Topics  . Smoking status: Never Smoker   . Smokeless tobacco: Never Used  . Alcohol Use: No  . Drug Use: No  . Sexual Activity: Yes    Partners: Male    Birth Control/ Protection: None     Comment: lives with husband, daughter, no dietary restrictions   Other Topics Concern  . Not on file   Social History Narrative  . No narrative on file    REVIEW OF SYSTEMS: Constitutional: No fevers, chills, or sweats, no generalized fatigue, change in appetite Eyes: No visual changes, double vision, eye pain Ear, nose and throat: No hearing loss, ear pain, nasal congestion, sore throat Cardiovascular: No chest pain, palpitations Respiratory:  No shortness of breath at rest or with exertion, wheezes GastrointestinaI: No nausea, vomiting, diarrhea, abdominal pain, fecal incontinence Genitourinary:  No dysuria, urinary retention or frequency Musculoskeletal:  No neck pain, back pain Integumentary: No rash, pruritus, skin lesions Neurological: as above Psychiatric: No depression, insomnia, anxiety Endocrine: No palpitations, fatigue, diaphoresis, mood swings, change in appetite, change in weight, increased thirst Hematologic/Lymphatic:  No anemia, purpura, petechiae. Allergic/Immunologic: no itchy/runny eyes, nasal congestion, recent allergic reactions, rashes  PHYSICAL EXAM: Filed Vitals:   11/03/13 0844  BP: 112/88  Pulse: 64  Resp: 14   General: No acute distress Head:  Normocephalic/atraumatic Neck: supple, no paraspinal tenderness, full range of motion Heart:  Regular rate and rhythm Lungs:  Clear to auscultation bilaterally Back: No paraspinal  tenderness Neurological Exam: alert and oriented to person, place, and time. Speech fluent and not dysarthric, language intact.  CN II-XII intact. Fundoscopic exam unremarkable, no papilledema.  Bulk and tone normal, muscle strength 5/5 throughout.  Sensation to light touch, temperature and vibration intact.  Deep tendon reflexes 2+ throughout, toes downgoing.  Finger to nose and heel to shin testing intact.  Gait normal, Romberg negative.  IMPRESSION: Dizziness Lightheadedness. Tension headache Etiology unknown but not true vertigo and with normal neurological exam, I don't suspect neurological etiology.  Orthostatics have been negative and I don't suspect autonomic dysfunction.  PLAN: 1.  Recommend follow up with PCP for further investigation and management. 2.  Regarding family history of cerebral aneurysms:  I don't think imaging of the head is warranted as I don't feel symptoms are related to an aneurysm and screening not indicated for someone with no first degree relatives with history of cerebral aneurysm. 3.  Reassurance. 4.  No follow-up needed.  45 minutes spent with patient, over 50% spent reviewing MRI, counseling and coordinating care.  Metta Clines, DO  CC:  Penni Homans, MD

## 2013-11-24 ENCOUNTER — Encounter: Payer: Self-pay | Admitting: Family Medicine

## 2013-11-24 ENCOUNTER — Ambulatory Visit (INDEPENDENT_AMBULATORY_CARE_PROVIDER_SITE_OTHER): Payer: BC Managed Care – PPO | Admitting: Family Medicine

## 2013-11-24 VITALS — BP 126/82 | HR 68 | Temp 97.9°F | Ht 65.75 in | Wt 221.1 lb

## 2013-11-24 DIAGNOSIS — E860 Dehydration: Secondary | ICD-10-CM

## 2013-11-24 DIAGNOSIS — H539 Unspecified visual disturbance: Secondary | ICD-10-CM

## 2013-11-24 DIAGNOSIS — R030 Elevated blood-pressure reading, without diagnosis of hypertension: Secondary | ICD-10-CM

## 2013-11-24 DIAGNOSIS — H8110 Benign paroxysmal vertigo, unspecified ear: Secondary | ICD-10-CM

## 2013-11-24 DIAGNOSIS — IMO0001 Reserved for inherently not codable concepts without codable children: Secondary | ICD-10-CM

## 2013-11-24 DIAGNOSIS — R51 Headache: Secondary | ICD-10-CM

## 2013-11-24 MED ORDER — AMLODIPINE BESYLATE 5 MG PO TABS: 5.0000 mg | ORAL_TABLET | Freq: Every day | ORAL | Status: DC

## 2013-11-24 NOTE — Assessment & Plan Note (Signed)
Improved with increased Metoprolol, but HA persistent. Will try to switch to Amlodipine 5 mg daily

## 2013-11-24 NOTE — Assessment & Plan Note (Signed)
Persistent will try switching to Amlodipine and see if that is helpful.

## 2013-11-24 NOTE — Assessment & Plan Note (Signed)
She has cut down on her caffeinated soda but still is not drinking adequate water. Will continue to try and improve this ratio and hydration status

## 2013-11-24 NOTE — Assessment & Plan Note (Signed)
Improved but not fully resolved

## 2013-11-24 NOTE — Progress Notes (Signed)
Subjective:     Patient ID: Erika Weaver, female   DOB: 22-Mar-1976, 38 y.o.   MRN: [id number]  CC: 4 week f/u HTN   HPI  1. Pt presents today for a 4 week f/u for newly diagnosed HTN. She was increased to Metoprolol 32m daily in 09/2013. She denies chest pains or palpitations.   2. Pt was referred to neurology visit 11/03/13 Dr. AMetta Clinesfor a 2 month episode of dizziness and recurrent headaches. She was diagnosed there with Tension headaches and dizziness. She has been evaluated by Dr. [facility] in the past for the same complaint. MRI w/wo contrast was performed in 2013 to r/o cerebral aneurysm due to her famhx.  She saw an opthalmologist 10/2013 with dilated eye exam and  ENT 09/2014 to r/o inner ear disequilibrium. She is experiencing half as much dizziness than before the higher dose of Metoprolol. Frequency of headaches is the same. She is asymptomatic today. Denies sensation of spinning. Pt reports she has been drinking gatorade but could make a better effort to drink more water.   3. States her husband was diagnosed with high cholesterol recently and she plans on improving her diet and exercise habits.    Review of Systems  Eyes: Negative for photophobia, discharge, itching and visual disturbance.  Respiratory: Negative for chest tightness and shortness of breath.   Cardiovascular: Negative for chest pain and palpitations.  Gastrointestinal: Negative for nausea and vomiting.  Neurological: Positive for dizziness and headaches. Negative for tremors, syncope, facial asymmetry, speech difficulty, weakness and numbness.   Past Medical History  Diagnosis Date  . Chicken pox as a child  . Overweight   . Headache(784.0) 11/22/2011  . Allergic state 11/22/2011  . Visual changes 11/22/2011  . Elevated BP 11/22/2011  . Anxiety 11/22/2011  . Sinusitis 11/22/2011  . Plantar fasciitis 01/11/2012  . Fatigue 01/11/2012  . Pharyngitis 01/11/2012  . Sleep apnea 04/03/2012    setting 2   . Chronic kidney disease     kidney stones  . Hypertension   . Kidney stone 12/21/2012  . Back pain 01/31/2013  . 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 Dehydration 01/31/2013  . Preventative health care 08/18/2013  . BPV (benign positional vertigo) 08/23/2013    Family History  Problem Relation Age of Onset  . Diabetes Mother     type 2  . Hypertension Mother   . Hyperlipidemia Mother   . Hypertension Father   . Hyperlipidemia Father   . Aneurysm Father     abdominal aorta  . Cancer Maternal Grandmother 82    colon, ovarian  . Other Maternal Grandfather 41    brain hemorrhage  . Stroke Maternal Grandfather   . Aneurysm Maternal Grandfather   . Heart attack Paternal Grandmother   . Heart disease Paternal Grandmother     MI  . Alzheimer's disease Paternal Grandfather   . Dementia Paternal Grandfather   . Heart attack Paternal Aunt   . Heart disease Paternal Aunt      AAA rupture  . Other Paternal Uncle     cerebreal brain hemorrhage  . Stroke Paternal Uncle   . Aneurysm Paternal Uncle   . Asthma Daughter   . Other Daughter     peanut allergy  . Heart disease Maternal Aunt     s/p cabg. brain aneurysm s/p coiling  . Aneurysm Maternal Aunt     coil behind eye    History   Social History  . Marital  Status: Married    Spouse Name: N/A    Number of Children: N/A  . Years of Education: N/A   Occupational History  . Not on file.   Social History Main Topics  . Smoking status: Never Smoker   . Smokeless tobacco: Never Used  . Alcohol Use: No  . Drug Use: No  . Sexual Activity: Yes    Partners: Male    Birth Control/ Protection: None     Comment: lives with husband, daughter, no dietary restrictions   Other Topics Concern  . Not on file   Social History Narrative  . No narrative on file   Current Outpatient Prescriptions on File Prior to Visit  Medication Sig Dispense Refill  . metoprolol succinate (TOPROL-XL) 50 MG 24 hr tablet Take 1 tablet (50 mg total)  by mouth daily. Take with or immediately following a meal.  90 tablet  3   No current facility-administered medications on file prior to visit.   No Known Allergies     Objective:   Physical Exam  Constitutional: She is oriented to person, place, and time. She appears well-developed and well-nourished. No distress.  HENT:  Head: Normocephalic and atraumatic.  Eyes: Conjunctivae, EOM and lids are normal. Pupils are equal, round, and reactive to light. Right eye exhibits no discharge. Left eye exhibits no discharge.  Cardiovascular: Normal rate and normal heart sounds.   Pulmonary/Chest: Effort normal. No respiratory distress.  Neurological: She is alert and oriented to person, place, and time. Coordination normal.  Rapid alternating movements were smooth, rhythmic, and fast. Finger to nose coordination was smooth and accurate. No nystagmus or abnormal eye movements were noted during evaluation of EOMs.   Psychiatric: She has a normal mood and affect.      Filed Vitals:   11/24/13 1016  BP: 126/82  Pulse: 68  Temp: 97.9 F (36.6 C)   Wt Readings from Last 3 Encounters:  11/24/13 221 lb 1.9 oz (100.299 kg)  11/03/13 217 lb 5 oz (98.572 kg)  10/09/13 217 lb (98.431 kg)   Temp Readings from Last 3 Encounters:  11/24/13 97.9 F (36.6 C) Oral  10/09/13 98.1 F (36.7 C) Oral  09/16/13 98.8 F (37.1 C) Temporal   BP Readings from Last 3 Encounters:  11/24/13 126/82  11/03/13 112/88  10/09/13 152/98   Pulse Readings from Last 3 Encounters:  11/24/13 68  11/03/13 64  10/09/13 67       Assessment:      1. Tension headaches 2. Dizziness, unknown origin 3. HTN 4. Overweight       Plan:      1. Tension headaches - Discussed hydration with patient and avoid simple sugars/sodas. 2. Dizziness - Same as above. 3. HTN- Discontinued Metoprolol. Start on 5 mg Amlodipine daily. BP check in 4 weeks.   4. Overweight- Pt expressed plans to change diet and start  exercising.   02.03.2015  [place] Dedrick Heffner, PA-S.  Patient seen, interviewed and examined with student agree with documentation  Visual changes Improving, seen by eye doctor and offered reassurance that her eyes are OK  BPV (benign positional vertigo) Improved but not fully resolved  Elevated BP Improved with increased Metoprolol, but HA persistent. Will try to switch to Amlodipine 5 mg daily  Headache Persistent will try switching to Amlodipine and see if that is helpful.  Dehydration She has cut down on her caffeinated soda but still is not drinking adequate water. Will continue to try and improve this ratio  and hydration status

## 2013-11-24 NOTE — Patient Instructions (Signed)

## 2013-11-24 NOTE — Assessment & Plan Note (Signed)
Improving, seen by eye doctor and offered reassurance that her eyes are OK

## 2013-11-24 NOTE — Progress Notes (Signed)
Pre visit review using our clinic review tool, if applicable. No additional management support is needed unless otherwise documented below in the visit note. 

## 2013-12-22 ENCOUNTER — Encounter: Payer: Self-pay | Admitting: Family Medicine

## 2013-12-22 ENCOUNTER — Ambulatory Visit (INDEPENDENT_AMBULATORY_CARE_PROVIDER_SITE_OTHER): Payer: BC Managed Care – PPO | Admitting: Family Medicine

## 2013-12-22 VITALS — BP 132/82 | HR 72 | Temp 98.0°F | Ht 65.75 in | Wt 220.0 lb

## 2013-12-22 DIAGNOSIS — R030 Elevated blood-pressure reading, without diagnosis of hypertension: Secondary | ICD-10-CM

## 2013-12-22 DIAGNOSIS — Z0000 Encounter for general adult medical examination without abnormal findings: Secondary | ICD-10-CM

## 2013-12-22 DIAGNOSIS — H8110 Benign paroxysmal vertigo, unspecified ear: Secondary | ICD-10-CM

## 2013-12-22 DIAGNOSIS — IMO0001 Reserved for inherently not codable concepts without codable children: Secondary | ICD-10-CM

## 2013-12-22 DIAGNOSIS — E663 Overweight: Secondary | ICD-10-CM

## 2013-12-22 NOTE — Patient Instructions (Signed)

## 2013-12-22 NOTE — Progress Notes (Signed)
Pre visit review using our clinic review tool, if applicable. No additional management support is needed unless otherwise documented below in the visit note. 

## 2013-12-27 ENCOUNTER — Encounter: Payer: Self-pay | Admitting: Family Medicine

## 2013-12-27 NOTE — Assessment & Plan Note (Signed)
Improved with Amlodipine, continue attempts at [place].

## 2013-12-27 NOTE — Assessment & Plan Note (Signed)
Minimal symptoms still encouraged to increase hydration

## 2013-12-27 NOTE — Assessment & Plan Note (Signed)
Breast exam c/w fibrocystic breasts. Sent for screening mgm

## 2013-12-27 NOTE — Assessment & Plan Note (Signed)
Encouraged increased exercise, DASH diet and decreased po intake

## 2013-12-27 NOTE — Progress Notes (Signed)
Patient ID: Erika Weaver, female   DOB: 1975/12/22, 38 y.o.   MRN: [id number] Erika Weaver [id number] Jun 27, 1976 12/27/2013      Progress Note-Follow Up  Subjective  Chief Complaint  Chief Complaint  Patient presents with  . Follow-up    4 week  . breast exam    HPI  Patient is a 38 year old Caucasian female who is in today for followup. She reports her blood pressures been somewhat better on the amlodipine. No significant headaches. Less episodes of lightheadedness as she has started to hydrate somewhat better but she technologist some approaching 64 ounces daily. Anxiety Timothy improved at this time. No breast tenderness or pain. No chest pain, palpitations or shortness of breath. No GI or GU concerns.  Past Medical History  Diagnosis Date  . Chicken pox as a child  . Overweight   . Headache(784.0) 11/22/2011  . Allergic state 11/22/2011  . Visual changes 11/22/2011  . Elevated BP 11/22/2011  . Anxiety 11/22/2011  . Sinusitis 11/22/2011  . Plantar fasciitis 01/11/2012  . Fatigue 01/11/2012  . Pharyngitis 01/11/2012  . Sleep apnea 04/03/2012    setting 2  . Chronic kidney disease     kidney stones  . Hypertension   . Kidney stone 12/21/2012  . Back pain 01/31/2013  . 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 Dehydration 01/31/2013  . Preventative health care 08/18/2013  . BPV (benign positional vertigo) 08/23/2013    Past Surgical History  Procedure Laterality Date  . Gum surgery    . Wisdom tooth extraction    . Perineal body repair      4 th degree tear with fistula between vagina and retum requiring 3 surgeries for correction    Family History  Problem Relation Age of Onset  . Diabetes Mother     type 2  . Hypertension Mother   . Hyperlipidemia Mother   . Hypertension Father   . Hyperlipidemia Father   . Aneurysm Father     abdominal aorta  . Cancer Maternal Grandmother 82    colon, ovarian  . Other Maternal Grandfather 41    brain hemorrhage  . Stroke  Maternal Grandfather   . Aneurysm Maternal Grandfather   . Heart attack Paternal Grandmother   . Heart disease Paternal Grandmother     MI  . Alzheimer's disease Paternal Grandfather   . Dementia Paternal Grandfather   . Heart attack Paternal Aunt   . Heart disease Paternal Aunt      AAA rupture  . Other Paternal Uncle     cerebreal brain hemorrhage  . Stroke Paternal Uncle   . Aneurysm Paternal Uncle   . Asthma Daughter   . Other Daughter     peanut allergy  . Heart disease Maternal Aunt     s/p cabg. brain aneurysm s/p coiling  . Aneurysm Maternal Aunt     coil behind eye    History   Social History  . Marital Status: Married    Spouse Name: N/A    Number of Children: N/A  . Years of Education: N/A   Occupational History  . Not on file.   Social History Main Topics  . Smoking status: Never Smoker   . Smokeless tobacco: Never Used  . Alcohol Use: No  . Drug Use: No  . Sexual Activity: Yes    Partners: Male    Birth Control/ Protection: None     Comment: lives with husband, daughter, no dietary restrictions   Other Topics  Concern  . Not on file   Social History Narrative  . No narrative on file    Current Outpatient Prescriptions on File Prior to Visit  Medication Sig Dispense Refill  . amLODipine (NORVASC) 5 MG tablet Take 1 tablet (5 mg total) by mouth daily.  90 tablet  3   No current facility-administered medications on file prior to visit.    No Known Allergies  Review of Systems  Review of Systems  Constitutional: Negative for fever and malaise/fatigue.  HENT: Negative for congestion.   Eyes: Negative for discharge.  Respiratory: Negative for shortness of breath.   Cardiovascular: Negative for chest pain, palpitations and leg swelling.  Gastrointestinal: Negative for nausea, abdominal pain and diarrhea.  Genitourinary: Negative for dysuria.  Musculoskeletal: Negative for falls.  Skin: Negative for rash.  Neurological: Negative for loss of  consciousness and headaches.  Endo/Heme/Allergies: Negative for polydipsia.  Psychiatric/Behavioral: Negative for depression and suicidal ideas. The patient is nervous/anxious. The patient does not have insomnia.     Objective  BP 132/82  Pulse 72  Temp(Src) 98 F (36.7 C) (Oral)  Ht 5' 5.75" (1.67 m)  Wt 220 lb (99.791 kg)  BMI 35.78 kg/m2  SpO2 98%  LMP 11/23/2013  Physical Exam  Physical Exam  Constitutional: She is oriented to person, place, and time and well-developed, well-nourished, and in no distress. No distress.  HENT:  Head: Normocephalic and atraumatic.  Eyes: Conjunctivae are normal.  Neck: Neck supple. No thyromegaly present.  Cardiovascular: Normal rate, regular rhythm and normal heart sounds.   No murmur heard. Pulmonary/Chest: Effort normal and breath sounds normal. She has no wheezes.  Abdominal: She exhibits no distension and no mass.  Genitourinary:  Breast exam b/l reveals no skin changes or discharge. Does has fibrocystic changes on exam but no discrete lesion. Most fullness in upper outer quadrant of left breast  Musculoskeletal: She exhibits no edema.  Lymphadenopathy:    She has no cervical adenopathy.  Neurological: She is alert and oriented to person, place, and time.  Skin: Skin is warm and dry. No rash noted. She is not diaphoretic.  Psychiatric: Memory, affect and judgment normal.    Lab Results  Component Value Date   TSH 1.354 10/09/2013   Lab Results  Component Value Date   WBC 9.1 10/09/2013   HGB 14.0 10/09/2013   HCT 40.8 10/09/2013   MCV 87.6 10/09/2013   PLT 349 10/09/2013   Lab Results  Component Value Date   CREATININE 0.71 10/09/2013   BUN 11 10/09/2013   NA 137 10/09/2013   K 4.4 10/09/2013   CL 103 10/09/2013   CO2 26 10/09/2013   Lab Results  Component Value Date   ALT 27 06/11/2013   AST 23 06/11/2013   ALKPHOS 65 06/11/2013   BILITOT 0.7 06/11/2013   Lab Results  Component Value Date   CHOL 199 06/11/2013    Lab Results  Component Value Date   HDL 37.50* 06/11/2013   Lab Results  Component Value Date   LDLCALC 143* 06/11/2013   Lab Results  Component Value Date   TRIG 94.0 06/11/2013   Lab Results  Component Value Date   CHOLHDL 5 06/11/2013     Assessment & Plan  Elevated BP Improved with Amlodipine, continue attempts at [place].  Overweight Encouraged increased exercise, DASH diet and decreased po intake  Preventative health care Breast exam c/w fibrocystic breasts. Sent for screening mgm  BPV (benign positional vertigo) Minimal symptoms still  encouraged to increase hydration

## 2014-01-28 ENCOUNTER — Other Ambulatory Visit: Payer: Self-pay

## 2014-01-28 DIAGNOSIS — Z1231 Encounter for screening mammogram for malignant neoplasm of breast: Secondary | ICD-10-CM

## 2014-02-04 ENCOUNTER — Ambulatory Visit: Payer: BC Managed Care – PPO | Admitting: Family Medicine

## 2014-02-09 ENCOUNTER — Telehealth: Payer: Self-pay

## 2014-02-09 NOTE — Telephone Encounter (Signed)
Pt informed. Pt is keeping her May 12 th appt and will call us if she can't wait until then.

## 2014-02-09 NOTE — Telephone Encounter (Signed)
So if we want to proceed with further imaging we need a visit and exam to justify otherwise it may not get covered

## 2014-02-09 NOTE — Telephone Encounter (Signed)
Patient called stating that she is still dizzy all the time almost to the point where she feels like she is falling? She stated that she saw Neurology and the doctor there didn't seem to think that her issue had anything to do with Neurology? Patient states she has had a headache for 1.5 weeks. Pt stated that her and Dr Charlett Blake have discussed doing a scan on her brain (has done this in the past), but wanted to wait and see what Neurology wanted to do?  Please advise?

## 2014-02-11 ENCOUNTER — Ambulatory Visit (INDEPENDENT_AMBULATORY_CARE_PROVIDER_SITE_OTHER): Payer: BC Managed Care – PPO | Admitting: Family Medicine

## 2014-02-11 ENCOUNTER — Encounter: Payer: Self-pay | Admitting: Family Medicine

## 2014-02-11 VITALS — BP 142/84 | HR 96 | Temp 97.5°F | Ht 65.75 in | Wt 223.0 lb

## 2014-02-11 DIAGNOSIS — R51 Headache: Secondary | ICD-10-CM

## 2014-02-11 DIAGNOSIS — R030 Elevated blood-pressure reading, without diagnosis of hypertension: Secondary | ICD-10-CM

## 2014-02-11 DIAGNOSIS — IMO0001 Reserved for inherently not codable concepts without codable children: Secondary | ICD-10-CM

## 2014-02-11 DIAGNOSIS — E860 Dehydration: Secondary | ICD-10-CM

## 2014-02-11 DIAGNOSIS — H6690 Otitis media, unspecified, unspecified ear: Secondary | ICD-10-CM

## 2014-02-11 DIAGNOSIS — F411 Generalized anxiety disorder: Secondary | ICD-10-CM

## 2014-02-11 DIAGNOSIS — F419 Anxiety disorder, unspecified: Secondary | ICD-10-CM

## 2014-02-11 DIAGNOSIS — E785 Hyperlipidemia, unspecified: Secondary | ICD-10-CM

## 2014-02-11 LAB — RENAL FUNCTION PANEL
ALBUMIN: 4 g/dL (ref 3.5–5.2)
BUN: 15 mg/dL (ref 6–23)
CHLORIDE: 103 meq/L (ref 96–112)
CO2: 30 meq/L (ref 19–32)
Calcium: 9.8 mg/dL (ref 8.4–10.5)
Creat: 0.71 mg/dL (ref 0.50–1.10)
GLUCOSE: 95 mg/dL (ref 70–99)
Phosphorus: 3.9 mg/dL (ref 2.3–4.6)
Potassium: 4 mEq/L (ref 3.5–5.3)
Sodium: 138 mEq/L (ref 135–145)

## 2014-02-11 LAB — CBC
HCT: 39.4 % (ref 36.0–46.0)
Hemoglobin: 13.9 g/dL (ref 12.0–15.0)
MCH: 31 pg (ref 26.0–34.0)
MCHC: 35.3 g/dL (ref 30.0–36.0)
MCV: 87.9 fL (ref 78.0–100.0)
Platelets: 306 10*3/uL (ref 150–400)
RBC: 4.48 MIL/uL (ref 3.87–5.11)
RDW: 12.8 % (ref 11.5–15.5)
WBC: 12.6 10*3/uL — ABNORMAL HIGH (ref 4.0–10.5)

## 2014-02-11 LAB — LIPID PANEL
Cholesterol: 206 mg/dL — ABNORMAL HIGH (ref 0–200)
HDL: 46 mg/dL (ref >39)
LDL Cholesterol: 128 mg/dL — ABNORMAL HIGH (ref 0–99)
Total CHOL/HDL Ratio: 4.5 Ratio
Triglycerides: 159 mg/dL — ABNORMAL HIGH (ref <150)
VLDL: 32 mg/dL (ref 0–40)

## 2014-02-11 LAB — HEPATIC FUNCTION PANEL
ALBUMIN: 4 g/dL (ref 3.5–5.2)
ALK PHOS: 75 U/L (ref 39–117)
ALT: 25 U/L (ref 0–35)
AST: 16 U/L (ref 0–37)
Bilirubin, Direct: 0.1 mg/dL (ref 0.0–0.3)
Indirect Bilirubin: 0.3 mg/dL (ref 0.2–1.2)
Total Bilirubin: 0.4 mg/dL (ref 0.2–1.2)
Total Protein: 7.2 g/dL (ref 6.0–8.3)

## 2014-02-11 MED ORDER — LORAZEPAM 0.5 MG PO TABS: 0.5000 mg | ORAL_TABLET | Freq: Two times a day (BID) | ORAL | Status: DC | PRN

## 2014-02-11 MED ORDER — TRAMADOL HCL 50 MG PO TABS: 50.0000 mg | ORAL_TABLET | Freq: Three times a day (TID) | ORAL | Status: DC | PRN

## 2014-02-11 MED ORDER — CEFDINIR 300 MG PO CAPS: 300.0000 mg | ORAL_CAPSULE | Freq: Two times a day (BID) | ORAL | Status: AC

## 2014-02-11 NOTE — Patient Instructions (Signed)

## 2014-02-11 NOTE — Progress Notes (Signed)
Pre visit review using our clinic review tool, if applicable. No additional management support is needed unless otherwise documented below in the visit note. 

## 2014-02-12 LAB — SEDIMENTATION RATE: Sed Rate: 27 mm/hr — ABNORMAL HIGH (ref 0–22)

## 2014-02-12 LAB — TSH: TSH: 1.919 u[IU]/mL (ref 0.350–4.500)

## 2014-02-12 LAB — LYME AB/WESTERN BLOT REFLEX: B burgdorferi Ab IgG+IgM: 0.33 {ISR}

## 2014-02-13 ENCOUNTER — Ambulatory Visit (HOSPITAL_BASED_OUTPATIENT_CLINIC_OR_DEPARTMENT_OTHER): Payer: BC Managed Care – PPO

## 2014-02-14 ENCOUNTER — Encounter: Payer: Self-pay | Admitting: Family Medicine

## 2014-02-14 DIAGNOSIS — H6690 Otitis media, unspecified, unspecified ear: Secondary | ICD-10-CM | POA: Insufficient documentation

## 2014-02-14 NOTE — Progress Notes (Signed)
Patient ID: Erika Weaver, female   DOB: 1976-05-25, 38 y.o.   MRN: [id number] Erika Weaver [id number] [birth year] 02/14/2014      Progress Note-Follow Up  Subjective  Chief Complaint  Chief Complaint  Patient presents with  . Dizziness    HPI  Patient is a 38 year old female in today for routine medical care. Patient is in today complaining of persistent headache and a sense of disequilibrium. She reports the worst of the headache in the back of her head and generalized. She has an increased sense of woozy feeling and lightheadedness. Now is related to position changes. No vision or hearing changes. No other neurologic complaints. He is using Advil and Aleve with marginal results. No nausea vomiting. No fevers chills. Denies CP/palp/SOB/fevers/GI or GU c/o. Taking meds as prescribed  Past Medical History  Diagnosis Date  . Chicken pox as a child  . Overweight   . Headache(784.0) 11/22/2011  . Allergic state 11/22/2011  . Visual changes 11/22/2011  . Elevated BP 11/22/2011  . Anxiety 11/22/2011  . Sinusitis 11/22/2011  . Plantar fasciitis 01/11/2012  . Fatigue 01/11/2012  . Pharyngitis 01/11/2012  . Sleep apnea 04/03/2012    setting 2  . Chronic kidney disease     kidney stones  . Hypertension   . Kidney stone 12/21/2012  . Back pain 01/31/2013  . 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 Dehydration 01/31/2013  . Preventative health care 08/18/2013  . BPV (benign positional vertigo) 08/23/2013    Past Surgical History  Procedure Laterality Date  . Gum surgery    . Wisdom tooth extraction    . Perineal body repair      4 th degree tear with fistula between vagina and retum requiring 3 surgeries for correction    Family History  Problem Relation Age of Onset  . Diabetes Mother     type 2  . Hypertension Mother   . Hyperlipidemia Mother   . Hypertension Father   . Hyperlipidemia Father   . Aneurysm Father     abdominal aorta  . Cancer Maternal Grandmother 82    colon,  ovarian  . Other Maternal Grandfather 41    brain hemorrhage  . Stroke Maternal Grandfather   . Aneurysm Maternal Grandfather   . Heart attack Paternal Grandmother   . Heart disease Paternal Grandmother     MI  . Alzheimer's disease Paternal Grandfather   . Dementia Paternal Grandfather   . Heart attack Paternal Aunt   . Heart disease Paternal Aunt      AAA rupture  . Other Paternal Uncle     cerebreal brain hemorrhage  . Stroke Paternal Uncle   . Aneurysm Paternal Uncle   . Asthma Daughter   . Other Daughter     peanut allergy  . Heart disease Maternal Aunt     s/p cabg. brain aneurysm s/p coiling  . Aneurysm Maternal Aunt     coil behind eye    History   Social History  . Marital Status: Married    Spouse Name: N/A    Number of Children: N/A  . Years of Education: N/A   Occupational History  . Not on file.   Social History Main Topics  . Smoking status: Never Smoker   . Smokeless tobacco: Never Used  . Alcohol Use: No  . Drug Use: No  . Sexual Activity: Yes    Partners: Male    Birth Control/ Protection: None     Comment:  lives with husband, daughter, no dietary restrictions   Other Topics Concern  . Not on file   Social History Narrative  . No narrative on file    Current Outpatient Prescriptions on File Prior to Visit  Medication Sig Dispense Refill  . amLODipine (NORVASC) 5 MG tablet Take 1 tablet (5 mg total) by mouth daily.  90 tablet  3   No current facility-administered medications on file prior to visit.    No Known Allergies  Review of Systems  Review of Systems  Constitutional: Positive for malaise/fatigue. Negative for fever.  HENT: Positive for congestion.   Eyes: Negative for discharge.  Respiratory: Negative for shortness of breath.   Cardiovascular: Negative for chest pain, palpitations and leg swelling.  Gastrointestinal: Negative for nausea, abdominal pain and diarrhea.  Genitourinary: Negative for dysuria.  Musculoskeletal:  Negative for falls.  Skin: Negative for rash.  Neurological: Positive for dizziness and headaches. Negative for loss of consciousness.  Endo/Heme/Allergies: Negative for polydipsia.  Psychiatric/Behavioral: Negative for depression and suicidal ideas. The patient is not nervous/anxious and does not have insomnia.     Objective  [name]  Pulse 96  Temp(Src) [name] (Oral)  Ht 5' 5.75" (1.67 m)  Wt 223 lb (101.152 kg)  BMI 36.27 kg/m2  SpO2 96%  LMP 01/25/2014  Physical Exam  Physical Exam  Constitutional: She is oriented to person, place, and time and well-developed, well-nourished, and in no distress. No distress.  HENT:  Head: Normocephalic and atraumatic.  Left TM is erythematous  Eyes: Conjunctivae are normal.  Neck: Neck supple. No thyromegaly present.  Cardiovascular: Normal rate, regular rhythm and normal heart sounds.   No murmur heard. Pulmonary/Chest: Effort normal and breath sounds normal. She has no wheezes.  Abdominal: She exhibits no distension and no mass.  Musculoskeletal: She exhibits no edema.  Lymphadenopathy:    She has no cervical adenopathy.  Neurological: She is alert and oriented to person, place, and time.  Skin: Skin is warm and dry. No rash noted. She is not diaphoretic.  Psychiatric: Memory, affect and judgment normal.    Lab Results  Component Value Date   TSH 1.919 02/11/2014   Lab Results  Component Value Date   WBC 12.6* 02/11/2014   HGB 13.9 02/11/2014   HCT 39.4 02/11/2014   MCV 87.9 02/11/2014   PLT 306 02/11/2014   Lab Results  Component Value Date   CREATININE 0.71 02/11/2014   BUN 15 02/11/2014   NA 138 02/11/2014   K 4.0 02/11/2014   CL 103 02/11/2014   CO2 30 02/11/2014   Lab Results  Component Value Date   ALT 25 02/11/2014   AST 16 02/11/2014   ALKPHOS 75 02/11/2014   BILITOT 0.4 02/11/2014   Lab Results  Component Value Date   CHOL 206* 02/11/2014   Lab Results  Component Value Date   HDL 46 02/11/2014   Lab  Results  Component Value Date   LDLCALC 128* 02/11/2014   Lab Results  Component Value Date   TRIG 159* 02/11/2014   Lab Results  Component Value Date   CHOLHDL 4.5 02/11/2014     Assessment & Plan  HTN (hypertension) Well controlled, no changes to meds. Encouraged heart healthy diet such as the DASH diet and exercise as tolerated.   Headache C/o persistent sense of dizziness. Will proceed with MRI and referral for management of headaches. We have been unsuccessful in improving her symptoms or finding an obvious cause  Anxiety Given a small amount of Lorazepam to try prn when feeling anxious.   Dehydration Is doing better with hydration  Otitis media Started on antibiotics and probiotics

## 2014-02-14 NOTE — Assessment & Plan Note (Signed)
Is doing better with hydration

## 2014-02-14 NOTE — Assessment & Plan Note (Signed)
C/o persistent sense of dizziness. Will proceed with MRI and referral for management of headaches. We have been unsuccessful in improving her symptoms or finding an obvious cause

## 2014-02-14 NOTE — Assessment & Plan Note (Signed)
Given a small amount of Lorazepam to try prn when feeling anxious.

## 2014-02-14 NOTE — Assessment & Plan Note (Signed)
Well controlled, no changes to meds. Encouraged heart healthy diet such as the DASH diet and exercise as tolerated.  °

## 2014-02-14 NOTE — Assessment & Plan Note (Signed)
Started on antibiotics and probiotics

## 2014-02-16 ENCOUNTER — Telehealth: Payer: Self-pay

## 2014-02-16 ENCOUNTER — Ambulatory Visit
Admission: RE | Admit: 2014-02-16 | Discharge: 2014-02-16 | Disposition: A | Discharge status: Home / Self Care | Payer: BC Managed Care – PPO | Source: Ambulatory Visit

## 2014-02-16 DIAGNOSIS — Z1231 Encounter for screening mammogram for malignant neoplasm of breast: Secondary | ICD-10-CM

## 2014-02-16 DIAGNOSIS — D729 Disorder of white blood cells, unspecified: Secondary | ICD-10-CM

## 2014-02-16 NOTE — Telephone Encounter (Signed)
Labs ordered per md

## 2014-02-18 LAB — CBC
HCT: 39.4 % (ref 36.0–46.0)
Hemoglobin: 13.9 g/dL (ref 12.0–15.0)
MCH: 31.2 pg (ref 26.0–34.0)
MCHC: 35.3 g/dL (ref 30.0–36.0)
MCV: 88.5 fL (ref 78.0–100.0)
Platelets: 283 10*3/uL (ref 150–400)
RBC: 4.45 MIL/uL (ref 3.87–5.11)
RDW: 12.6 % (ref 11.5–15.5)
WBC: 8.3 10*3/uL (ref 4.0–10.5)

## 2014-02-18 LAB — SEDIMENTATION RATE: Sed Rate: 36 mm/hr — ABNORMAL HIGH (ref 0–22)

## 2014-02-19 ENCOUNTER — Other Ambulatory Visit: Payer: Self-pay | Admitting: Family Medicine

## 2014-02-19 DIAGNOSIS — R928 Other abnormal and inconclusive findings on diagnostic imaging of breast: Secondary | ICD-10-CM

## 2014-02-19 LAB — URINALYSIS
Bilirubin Urine: NEGATIVE
GLUCOSE, UA: NEGATIVE mg/dL
Hgb urine dipstick: NEGATIVE
Ketones, ur: NEGATIVE mg/dL
NITRITE: NEGATIVE
PROTEIN: NEGATIVE mg/dL
Specific Gravity, Urine: 1.019 (ref 1.005–1.030)
UROBILINOGEN UA: 0.2 mg/dL (ref 0.0–1.0)
pH: 6 (ref 5.0–8.0)

## 2014-02-19 LAB — CULTURE, URINE COMPREHENSIVE
COLONY COUNT: NO GROWTH
ORGANISM ID, BACTERIA: NO GROWTH

## 2014-02-20 ENCOUNTER — Ambulatory Visit (HOSPITAL_BASED_OUTPATIENT_CLINIC_OR_DEPARTMENT_OTHER)
Admission: RE | Admit: 2014-02-20 | Discharge: 2014-02-20 | Disposition: A | Discharge status: Home / Self Care | Payer: BC Managed Care – PPO | Source: Ambulatory Visit | Attending: Family Medicine | Admitting: Family Medicine

## 2014-02-20 DIAGNOSIS — R269 Unspecified abnormalities of gait and mobility: Secondary | ICD-10-CM | POA: Insufficient documentation

## 2014-02-20 DIAGNOSIS — R51 Headache: Secondary | ICD-10-CM | POA: Insufficient documentation

## 2014-02-20 MED ORDER — GADOBENATE DIMEGLUMINE 529 MG/ML IV SOLN: 20.0000 mL | Freq: Once | INTRAVENOUS | Status: AC | PRN

## 2014-03-02 ENCOUNTER — Other Ambulatory Visit: Payer: Self-pay

## 2014-03-02 ENCOUNTER — Other Ambulatory Visit: Payer: Self-pay | Admitting: Family Medicine

## 2014-03-02 ENCOUNTER — Ambulatory Visit (INDEPENDENT_AMBULATORY_CARE_PROVIDER_SITE_OTHER): Payer: BC Managed Care – PPO | Admitting: Family Medicine

## 2014-03-02 ENCOUNTER — Encounter: Payer: Self-pay | Admitting: Family Medicine

## 2014-03-02 ENCOUNTER — Telehealth: Payer: Self-pay | Admitting: Family Medicine

## 2014-03-02 VITALS — BP 128/82 | HR 70 | Temp 98.4°F | Ht 65.75 in | Wt 222.0 lb

## 2014-03-02 DIAGNOSIS — E663 Overweight: Secondary | ICD-10-CM

## 2014-03-02 DIAGNOSIS — I10 Essential (primary) hypertension: Secondary | ICD-10-CM

## 2014-03-02 DIAGNOSIS — R928 Other abnormal and inconclusive findings on diagnostic imaging of breast: Secondary | ICD-10-CM

## 2014-03-02 DIAGNOSIS — E785 Hyperlipidemia, unspecified: Secondary | ICD-10-CM

## 2014-03-02 DIAGNOSIS — R51 Headache: Secondary | ICD-10-CM

## 2014-03-02 DIAGNOSIS — H6690 Otitis media, unspecified, unspecified ear: Secondary | ICD-10-CM

## 2014-03-02 DIAGNOSIS — Z0000 Encounter for general adult medical examination without abnormal findings: Secondary | ICD-10-CM

## 2014-03-02 DIAGNOSIS — H8110 Benign paroxysmal vertigo, unspecified ear: Secondary | ICD-10-CM

## 2014-03-02 NOTE — Assessment & Plan Note (Signed)
Encouraged DASH diet, decrease po intake and increase exercise as tolerated. Needs 7-8 hours of sleep nightly. Avoid trans fats, eat small, frequent meals every 4-5 hours with lean proteins, complex carbs and healthy fats. Minimize simple carbs, GMO foods. 

## 2014-03-02 NOTE — Patient Instructions (Signed)

## 2014-03-02 NOTE — Assessment & Plan Note (Signed)
Well controlled, no changes to meds. Encouraged heart healthy diet such as the DASH diet and exercise as tolerated.  °

## 2014-03-02 NOTE — Progress Notes (Signed)
Pre visit review using our clinic review tool, if applicable. No additional management support is needed unless otherwise documented below in the visit note. 

## 2014-03-02 NOTE — Assessment & Plan Note (Signed)
No spinning just vagues wooezy lightheaded feelings, encouraged exercise, better po intake more frequent with less carbs and more proteins, increase hydration

## 2014-03-02 NOTE — Telephone Encounter (Signed)
Lab order week of 05-22-2014 lipid renal cbc tsh hepatic

## 2014-03-02 NOTE — Assessment & Plan Note (Signed)
Improving, TM less erythematous on left and clear. Finish course of antibiotics

## 2014-03-02 NOTE — Assessment & Plan Note (Addendum)
Greatly improved, continue same hydration and decrease carbs increase proteins in diet. Use Ibuprofen and Tylenol prn for HA

## 2014-03-04 ENCOUNTER — Ambulatory Visit: Payer: BC Managed Care – PPO | Admitting: Neurology

## 2014-03-05 ENCOUNTER — Encounter (INDEPENDENT_AMBULATORY_CARE_PROVIDER_SITE_OTHER): Payer: Self-pay

## 2014-03-05 ENCOUNTER — Ambulatory Visit
Admission: RE | Admit: 2014-03-05 | Discharge: 2014-03-05 | Disposition: A | Discharge status: Home / Self Care | Payer: BC Managed Care – PPO | Source: Ambulatory Visit | Attending: Family Medicine | Admitting: Family Medicine

## 2014-03-05 DIAGNOSIS — R928 Other abnormal and inconclusive findings on diagnostic imaging of breast: Secondary | ICD-10-CM

## 2014-03-07 ENCOUNTER — Encounter: Payer: Self-pay | Admitting: Family Medicine

## 2014-03-07 DIAGNOSIS — E782 Mixed hyperlipidemia: Secondary | ICD-10-CM | POA: Insufficient documentation

## 2014-03-07 DIAGNOSIS — E785 Hyperlipidemia, unspecified: Secondary | ICD-10-CM

## 2014-03-07 HISTORY — DX: Hyperlipidemia, unspecified: E78.5

## 2014-03-07 NOTE — Assessment & Plan Note (Signed)
Encouraged heart healthy diet, increase exercise, avoid trans fats, consider a krill oil cap daily 

## 2014-03-07 NOTE — Progress Notes (Signed)
Patient ID: Erika Weaver, female   DOB: 12-11-1975, 38 y.o.   MRN: [id number] Erika Weaver [id number] 1976/03/03 03/07/2014      Progress Note-Follow Up  Subjective  Chief Complaint  Chief Complaint  Patient presents with  . Follow-up    6 week    HPI  Patient is a 38 year old female in today for routine medical care. She is feeling well today. Continues to have slight sense of feeling lightheaded but denies any significant headache, chest pain or palpitations at this time. No shortness of breath GI or GU concerns. No vision or hearing changes.  Past Medical History  Diagnosis Date  . Chicken pox as a child  . Overweight   . Headache(784.0) 11/22/2011  . Allergic state 11/22/2011  . Visual changes 11/22/2011  . Elevated BP 11/22/2011  . Anxiety 11/22/2011  . Sinusitis 11/22/2011  . Plantar fasciitis 01/11/2012  . Fatigue 01/11/2012  . Pharyngitis 01/11/2012  . Sleep apnea 04/03/2012    setting 2  . Chronic kidney disease     kidney stones  . Hypertension   . Kidney stone 12/21/2012  . Back pain 01/31/2013  . 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 Dehydration 01/31/2013  . Preventative health care 08/18/2013  . BPV (benign positional vertigo) 08/23/2013  . Other and unspecified hyperlipidemia 03/07/2014    Past Surgical History  Procedure Laterality Date  . Gum surgery    . Wisdom tooth extraction    . Perineal body repair      4 th degree tear with fistula between vagina and retum requiring 3 surgeries for correction    Family History  Problem Relation Age of Onset  . Diabetes Mother     type 2  . Hypertension Mother   . Hyperlipidemia Mother   . Hypertension Father   . Hyperlipidemia Father   . Aneurysm Father     abdominal aorta  . Cancer Maternal Grandmother 82    colon, ovarian  . Other Maternal Grandfather 41    brain hemorrhage  . Stroke Maternal Grandfather   . Aneurysm Maternal Grandfather   . Heart attack Paternal Grandmother   . Heart disease  Paternal Grandmother     MI  . Alzheimer's disease Paternal Grandfather   . Dementia Paternal Grandfather   . Heart attack Paternal Aunt   . Heart disease Paternal Aunt      AAA rupture  . Other Paternal Uncle     cerebreal brain hemorrhage  . Stroke Paternal Uncle   . Aneurysm Paternal Uncle   . Asthma Daughter   . Other Daughter     peanut allergy  . Heart disease Maternal Aunt     s/p cabg. brain aneurysm s/p coiling  . Aneurysm Maternal Aunt     coil behind eye    History   Social History  . Marital Status: Married    Spouse Name: N/A    Number of Children: N/A  . Years of Education: N/A   Occupational History  . Not on file.   Social History Main Topics  . Smoking status: Never Smoker   . Smokeless tobacco: Never Used  . Alcohol Use: No  . Drug Use: No  . Sexual Activity: Yes    Partners: Male    Birth Control/ Protection: None     Comment: lives with husband, daughter, no dietary restrictions   Other Topics Concern  . Not on file   Social History Narrative  . No narrative on  file    Current Outpatient Prescriptions on File Prior to Visit  Medication Sig Dispense Refill  . amLODipine (NORVASC) 5 MG tablet Take 1 tablet (5 mg total) by mouth daily.  90 tablet  3  . LORazepam (ATIVAN) 0.5 MG tablet Take 1 tablet (0.5 mg total) by mouth 2 (two) times daily as needed for anxiety or sedation.  30 tablet  1  . traMADol (ULTRAM) 50 MG tablet Take 1 tablet (50 mg total) by mouth every 8 (eight) hours as needed.  30 tablet  0   No current facility-administered medications on file prior to visit.    No Known Allergies  Review of Systems  Review of Systems  Constitutional: Negative for fever and malaise/fatigue.  HENT: Negative for congestion.   Eyes: Negative for discharge.  Respiratory: Negative for shortness of breath.   Cardiovascular: Negative for chest pain, palpitations and leg swelling.  Gastrointestinal: Negative for nausea, abdominal pain and  diarrhea.  Genitourinary: Negative for dysuria.  Musculoskeletal: Negative for falls.  Skin: Negative for rash.  Neurological: Negative for loss of consciousness and headaches.  Endo/Heme/Allergies: Negative for polydipsia.  Psychiatric/Behavioral: Negative for depression and suicidal ideas. The patient is not nervous/anxious and does not have insomnia.     Objective  BP 128/82  Pulse 70  Temp(Src) 98.4 F (36.9 C) (Oral)  Ht 5' 5.75" (1.67 m)  Wt 222 lb 0.6 oz (100.717 kg)  BMI 36.11 kg/m2  SpO2 98%  LMP 01/25/2014  Physical Exam  Physical Exam  Constitutional: She is oriented to person, place, and time and well-developed, well-nourished, and in no distress. No distress.  HENT:  Head: Normocephalic and atraumatic.  Eyes: Conjunctivae are normal.  Neck: Neck supple. No thyromegaly present.  Cardiovascular: Normal rate, regular rhythm and normal heart sounds.   No murmur heard. Pulmonary/Chest: Effort normal and breath sounds normal. She has no wheezes.  Abdominal: She exhibits no distension and no mass.  Musculoskeletal: She exhibits no edema.  Lymphadenopathy:    She has no cervical adenopathy.  Neurological: She is alert and oriented to person, place, and time.  Skin: Skin is warm and dry. No rash noted. She is not diaphoretic.  Psychiatric: Memory, affect and judgment normal.    Lab Results  Component Value Date   TSH 1.919 02/11/2014   Lab Results  Component Value Date   WBC 8.3 02/16/2014   HGB 13.9 02/16/2014   HCT 39.4 02/16/2014   MCV 88.5 02/16/2014   PLT 283 02/16/2014   Lab Results  Component Value Date   CREATININE 0.71 02/11/2014   BUN 15 02/11/2014   NA 138 02/11/2014   K 4.0 02/11/2014   CL 103 02/11/2014   CO2 30 02/11/2014   Lab Results  Component Value Date   ALT 25 02/11/2014   AST 16 02/11/2014   ALKPHOS 75 02/11/2014   BILITOT 0.4 02/11/2014   Lab Results  Component Value Date   CHOL 206* 02/11/2014   Lab Results  Component Value Date    HDL 46 02/11/2014   Lab Results  Component Value Date   LDLCALC 128* 02/11/2014   Lab Results  Component Value Date   TRIG 159* 02/11/2014   Lab Results  Component Value Date   CHOLHDL 4.5 02/11/2014     Assessment & Plan  HTN (hypertension) Well controlled, no changes to meds. Encouraged heart healthy diet such as the DASH diet and exercise as tolerated.   Otitis media Improving, TM less erythematous on left  and clear. Finish course of antibiotics  Overweight Encouraged DASH diet, decrease po intake and increase exercise as tolerated. Needs 7-8 hours of sleep nightly. Avoid trans fats, eat small, frequent meals every 4-5 hours with lean proteins, complex carbs and healthy fats. Minimize simple carbs, GMO foods.  Headache Greatly improved, continue same hydration and decrease carbs increase proteins in diet. Use Ibuprofen and Tylenol prn for HA  BPV (benign positional vertigo) No spinning just vagues wooezy lightheaded feelings, encouraged exercise, better po intake more frequent with less carbs and more proteins, increase hydration  Other and unspecified hyperlipidemia Encouraged heart healthy diet, increase exercise, avoid trans fats, consider a krill oil cap daily

## 2014-05-27 ENCOUNTER — Telehealth: Payer: Self-pay

## 2014-05-27 LAB — RENAL FUNCTION PANEL
Albumin: 3.5 g/dL (ref 3.5–5.2)
BUN: 8 mg/dL (ref 6–23)
CHLORIDE: 102 meq/L (ref 96–112)
CO2: 27 meq/L (ref 19–32)
Calcium: 9.1 mg/dL (ref 8.4–10.5)
Creat: 0.72 mg/dL (ref 0.50–1.10)
Glucose, Bld: 92 mg/dL (ref 70–99)
POTASSIUM: 4.3 meq/L (ref 3.5–5.3)
Phosphorus: 3 mg/dL (ref 2.3–4.6)
SODIUM: 138 meq/L (ref 135–145)

## 2014-05-27 LAB — HEPATIC FUNCTION PANEL
ALT: 24 U/L (ref 0–35)
AST: 18 U/L (ref 0–37)
Albumin: 3.5 g/dL (ref 3.5–5.2)
Alkaline Phosphatase: 68 U/L (ref 39–117)
BILIRUBIN DIRECT: 0.1 mg/dL (ref 0.0–0.3)
BILIRUBIN INDIRECT: 0.5 mg/dL (ref 0.2–1.2)
Total Bilirubin: 0.6 mg/dL (ref 0.2–1.2)
Total Protein: 7.2 g/dL (ref 6.0–8.3)

## 2014-05-27 LAB — LIPID PANEL
Cholesterol: 194 mg/dL (ref 0–200)
HDL: 42 mg/dL (ref >39)
LDL CALC: 133 mg/dL — AB (ref 0–99)
Total CHOL/HDL Ratio: 4.6 Ratio
Triglycerides: 96 mg/dL (ref <150)
VLDL: 19 mg/dL (ref 0–40)

## 2014-05-27 LAB — CBC
HEMATOCRIT: 38.4 % (ref 36.0–46.0)
HEMOGLOBIN: 13.7 g/dL (ref 12.0–15.0)
MCH: 31 pg (ref 26.0–34.0)
MCHC: 35.7 g/dL (ref 30.0–36.0)
MCV: 86.9 fL (ref 78.0–100.0)
Platelets: 269 10*3/uL (ref 150–400)
RBC: 4.42 MIL/uL (ref 3.87–5.11)
RDW: 12.5 % (ref 11.5–15.5)
WBC: 8.9 10*3/uL (ref 4.0–10.5)

## 2014-05-27 LAB — TSH: TSH: 1.671 u[IU]/mL (ref 0.350–4.500)

## 2014-05-27 NOTE — Telephone Encounter (Signed)
Went to [facility] early summer and daughter stepped on a bat. Not sure if it was alive or dead. Pediatrician advised that she has her series of rabies shots. pts daughter did the series. pts first shot was June 24th and last on July 8th.  But a week ago a bird/bat flew at her and thought it was on her tag? There not sure if it was a bird or bat or tag tickling her.  Pts mom said there was a little red mark but thinks it was from the tag.  Patient would like to know how long the rabies shot is good for? Pts mom doesn't know if she was bit or not.   Patient has called urgent care and her pediatrician's office (the md is on vacation) and can't get an answer.  Please advise how long the rabies shot is good for?

## 2014-05-27 NOTE — Telephone Encounter (Signed)
So I looked on Up To Date and cannot find any info on how long the shots are good for. Please call the health department and see if they know. The patient can also call the health department. I believe there is some lab tests that can be run to see if she still has immunity.

## 2014-05-27 NOTE — Telephone Encounter (Signed)
Message copied by Varney Daily on Thu May 27, 2014 12:22 PM ------      Message from: Irven Baltimore      Created: Thu May 27, 2014 12:16 PM       Patient called in wanting to speak to you. She says that she has a weird question to ask [name]. It is regarding her daughter, whom stepped on a bat a few months ago and has had to go through a series of rabies shots.             [phone] ------

## 2014-05-28 NOTE — Telephone Encounter (Signed)
Left a detailed message for patients mom.  Also, pts mom yesterday stated that the Health Department couldn't give her an answer either

## 2014-06-03 ENCOUNTER — Encounter: Payer: Self-pay | Admitting: Family Medicine

## 2014-06-03 ENCOUNTER — Ambulatory Visit (INDEPENDENT_AMBULATORY_CARE_PROVIDER_SITE_OTHER): Payer: BC Managed Care – PPO | Admitting: Family Medicine

## 2014-06-03 VITALS — BP 120/80 | HR 71 | Temp 98.1°F | Ht 65.75 in | Wt 222.0 lb

## 2014-06-03 DIAGNOSIS — H8110 Benign paroxysmal vertigo, unspecified ear: Secondary | ICD-10-CM

## 2014-06-03 DIAGNOSIS — R51 Headache: Secondary | ICD-10-CM

## 2014-06-03 DIAGNOSIS — E663 Overweight: Secondary | ICD-10-CM

## 2014-06-03 DIAGNOSIS — E860 Dehydration: Secondary | ICD-10-CM

## 2014-06-03 DIAGNOSIS — I10 Essential (primary) hypertension: Secondary | ICD-10-CM

## 2014-06-03 NOTE — Progress Notes (Signed)
Pre visit review using our clinic review tool, if applicable. No additional management support is needed unless otherwise documented below in the visit note. 

## 2014-06-03 NOTE — Patient Instructions (Signed)

## 2014-06-06 ENCOUNTER — Encounter: Payer: Self-pay | Admitting: Family Medicine

## 2014-06-06 NOTE — Assessment & Plan Note (Signed)
Less frequent. Encouraged increased hydration, 64 ounces of clear fluids daily. Minimize alcohol and caffeine. Eat small frequent meals with lean proteins and complex carbs. Avoid high and low blood sugars. Get adequate sleep, 7-8 hours a night. Needs exercise daily preferably in the morning.

## 2014-06-06 NOTE — Assessment & Plan Note (Signed)
Well controlled, no changes to meds. Encouraged heart healthy diet such as the DASH diet and exercise as tolerated.  °

## 2014-06-06 NOTE — Assessment & Plan Note (Signed)
No recent episodes has been eating better and hydrating better and she does believe that has been helping. Encouraged the same

## 2014-06-06 NOTE — Progress Notes (Signed)
Patient ID: Erika Weaver, female   DOB: 1976-08-05, 38 y.o.   MRN: [id number] Erika Weaver [id number] 1976/06/24 06/06/2014      Progress Note-Follow Up  Subjective  Chief Complaint  Chief Complaint  Patient presents with  . Follow-up    3 month    HPI  Patient is a 38 year old female in today for routine medical care. She is feeling much better. She is hydrating better and eating more consistently. She's had no more significant headaches or vertigo. She has less of a sense of disequilibrium. Since that time being. No recent illness. All does continue to struggle with some low back pain but it is manageable. Well controlled, no changes to meds. Encouraged heart healthy diet such as the DASH diet and exercise as tolerated.   Past Medical History  Diagnosis Date  . Chicken pox as a child  . Overweight(278.02)   . Headache(784.0) 11/22/2011  . Allergic state 11/22/2011  . Visual changes 11/22/2011  . Elevated BP 11/22/2011  . Anxiety 11/22/2011  . Sinusitis 11/22/2011  . Plantar fasciitis 01/11/2012  . Fatigue 01/11/2012  . Pharyngitis 01/11/2012  . Sleep apnea 04/03/2012    setting 2  . Chronic kidney disease     kidney stones  . Hypertension   . Kidney stone 12/21/2012  . Back pain 01/31/2013  . 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 Dehydration 01/31/2013  . Preventative health care 08/18/2013  . BPV (benign positional vertigo) 08/23/2013  . Other and unspecified hyperlipidemia 03/07/2014    Past Surgical History  Procedure Laterality Date  . Gum surgery    . Wisdom tooth extraction    . Perineal body repair      4 th degree tear with fistula between vagina and retum requiring 3 surgeries for correction    Family History  Problem Relation Age of Onset  . Diabetes Mother     type 2  . Hypertension Mother   . Hyperlipidemia Mother   . Hypertension Father   . Hyperlipidemia Father   . Aneurysm Father     abdominal aorta  . Cancer Maternal Grandmother 82    colon,  ovarian  . Other Maternal Grandfather 41    brain hemorrhage  . Stroke Maternal Grandfather   . Aneurysm Maternal Grandfather   . Heart attack Paternal Grandmother   . Heart disease Paternal Grandmother     MI  . Alzheimer's disease Paternal Grandfather   . Dementia Paternal Grandfather   . Heart attack Paternal Aunt   . Heart disease Paternal Aunt      AAA rupture  . Other Paternal Uncle     cerebreal brain hemorrhage  . Stroke Paternal Uncle   . Aneurysm Paternal Uncle   . Asthma Daughter   . Other Daughter     peanut allergy  . Heart disease Maternal Aunt     s/p cabg. brain aneurysm s/p coiling  . Aneurysm Maternal Aunt     coil behind eye    History   Social History  . Marital Status: Married    Spouse Name: N/A    Number of Children: N/A  . Years of Education: N/A   Occupational History  . Not on file.   Social History Main Topics  . Smoking status: Never Smoker   . Smokeless tobacco: Never Used  . Alcohol Use: No  . Drug Use: No  . Sexual Activity: Yes    Partners: Male    Birth Control/ Protection: None  Comment: lives with husband, daughter, no dietary restrictions   Other Topics Concern  . Not on file   Social History Narrative  . No narrative on file    Current Outpatient Prescriptions on File Prior to Visit  Medication Sig Dispense Refill  . amLODipine (NORVASC) 5 MG tablet Take 1 tablet (5 mg total) by mouth daily.  90 tablet  3   No current facility-administered medications on file prior to visit.    No Known Allergies  Review of Systems  Review of Systems  Constitutional: Negative for fever and malaise/fatigue.  HENT: Negative for congestion.   Eyes: Negative for discharge.  Respiratory: Negative for shortness of breath.   Cardiovascular: Negative for chest pain, palpitations and leg swelling.  Gastrointestinal: Negative for nausea, abdominal pain and diarrhea.  Genitourinary: Negative for dysuria.  Musculoskeletal: Negative  for falls.  Skin: Negative for rash.  Neurological: Negative for loss of consciousness and headaches.  Endo/Heme/Allergies: Negative for polydipsia.  Psychiatric/Behavioral: Negative for depression and suicidal ideas. The patient is not nervous/anxious and does not have insomnia.     Objective  BP 120/80  Pulse 71  Temp(Src) 98.1 F (36.7 C) (Oral)  Ht 5' 5.75" (1.67 m)  Wt 222 lb 0.6 oz (100.717 kg)  BMI 36.11 kg/m2  SpO2 98%  LMP 05/28/2014  Physical Exam  Physical Exam  Constitutional: She is oriented to person, place, and time and well-developed, well-nourished, and in no distress. No distress.  HENT:  Head: Normocephalic and atraumatic.  Eyes: Conjunctivae are normal.  Neck: Neck supple. No thyromegaly present.  Cardiovascular: Normal rate, regular rhythm and normal heart sounds.   No murmur heard. Pulmonary/Chest: Effort normal and breath sounds normal. She has no wheezes.  Abdominal: She exhibits no distension and no mass.  Musculoskeletal: She exhibits no edema.  Lymphadenopathy:    She has no cervical adenopathy.  Neurological: She is alert and oriented to person, place, and time.  Skin: Skin is warm and dry. No rash noted. She is not diaphoretic.  Psychiatric: Memory, affect and judgment normal.    Lab Results  Component Value Date   TSH 1.671 05/27/2014   Lab Results  Component Value Date   WBC 8.9 05/27/2014   HGB 13.7 05/27/2014   HCT 38.4 05/27/2014   MCV 86.9 05/27/2014   PLT 269 05/27/2014   Lab Results  Component Value Date   CREATININE 0.72 05/27/2014   BUN 8 05/27/2014   NA 138 05/27/2014   K 4.3 05/27/2014   CL 102 05/27/2014   CO2 27 05/27/2014   Lab Results  Component Value Date   ALT 24 05/27/2014   AST 18 05/27/2014   ALKPHOS 68 05/27/2014   BILITOT 0.6 05/27/2014   Lab Results  Component Value Date   CHOL 194 05/27/2014   Lab Results  Component Value Date   HDL 42 05/27/2014   Lab Results  Component Value Date   LDLCALC 133* 05/27/2014   Lab Results   Component Value Date   TRIG 96 05/27/2014   Lab Results  Component Value Date   CHOLHDL 4.6 05/27/2014     Assessment & Plan  HTN (hypertension) Well controlled, no changes to meds. Encouraged heart healthy diet such as the DASH diet and exercise as tolerated.   BPV (benign positional vertigo) No recent episodes has been eating better and hydrating better and she does believe that has been helping. Encouraged the same  Dehydration hyrating and feeling better  Headache Less frequent. Encouraged  increased hydration, 64 ounces of clear fluids daily. Minimize alcohol and caffeine. Eat small frequent meals with lean proteins and complex carbs. Avoid high and low blood sugars. Get adequate sleep, 7-8 hours a night. Needs exercise daily preferably in the morning.  Overweight Encouraged DASH diet, decrease po intake and increase exercise as tolerated. Needs 7-8 hours of sleep nightly. Avoid trans fats, eat small, frequent meals every 4-5 hours with lean proteins, complex carbs and healthy fats. Minimize simple carbs, GMO foods.

## 2014-06-06 NOTE — Assessment & Plan Note (Signed)
Encouraged DASH diet, decrease po intake and increase exercise as tolerated. Needs 7-8 hours of sleep nightly. Avoid trans fats, eat small, frequent meals every 4-5 hours with lean proteins, complex carbs and healthy fats. Minimize simple carbs, GMO foods. 

## 2014-06-06 NOTE — Assessment & Plan Note (Signed)
hyrating and feeling better

## 2014-09-01 ENCOUNTER — Telehealth: Payer: Self-pay | Admitting: Family Medicine

## 2014-09-01 ENCOUNTER — Emergency Department (HOSPITAL_COMMUNITY): Payer: BC Managed Care – PPO

## 2014-09-01 ENCOUNTER — Encounter (HOSPITAL_COMMUNITY): Payer: Self-pay | Admitting: Emergency Medicine

## 2014-09-01 ENCOUNTER — Emergency Department (HOSPITAL_COMMUNITY)
Admission: EM | Admit: 2014-09-01 | Discharge: 2014-09-01 | Disposition: A | Discharge status: Home / Self Care | Payer: BC Managed Care – PPO | Attending: Emergency Medicine | Admitting: Emergency Medicine

## 2014-09-01 DIAGNOSIS — I129 Hypertensive chronic kidney disease with stage 1 through stage 4 chronic kidney disease, or unspecified chronic kidney disease: Secondary | ICD-10-CM | POA: Insufficient documentation

## 2014-09-01 DIAGNOSIS — Z79899 Other long term (current) drug therapy: Secondary | ICD-10-CM | POA: Diagnosis not present

## 2014-09-01 DIAGNOSIS — Z8659 Personal history of other mental and behavioral disorders: Secondary | ICD-10-CM | POA: Insufficient documentation

## 2014-09-01 DIAGNOSIS — Z8639 Personal history of other endocrine, nutritional and metabolic disease: Secondary | ICD-10-CM | POA: Diagnosis not present

## 2014-09-01 DIAGNOSIS — N189 Chronic kidney disease, unspecified: Secondary | ICD-10-CM | POA: Insufficient documentation

## 2014-09-01 DIAGNOSIS — H539 Unspecified visual disturbance: Secondary | ICD-10-CM

## 2014-09-01 DIAGNOSIS — Z87442 Personal history of urinary calculi: Secondary | ICD-10-CM | POA: Diagnosis not present

## 2014-09-01 DIAGNOSIS — Z8619 Personal history of other infectious and parasitic diseases: Secondary | ICD-10-CM | POA: Diagnosis not present

## 2014-09-01 DIAGNOSIS — H538 Other visual disturbances: Secondary | ICD-10-CM | POA: Insufficient documentation

## 2014-09-01 LAB — CBG MONITORING, ED: Glucose-Capillary: 122 mg/dL — ABNORMAL HIGH (ref 70–99)

## 2014-09-01 MED ORDER — ACETAMINOPHEN 500 MG PO TABS
1000.0000 mg | ORAL_TABLET | Freq: Four times a day (QID) | ORAL | Status: DC | PRN
  Administered 2014-09-01: 1000 mg via ORAL
  Filled 2014-09-01: qty 2

## 2014-09-01 NOTE — Telephone Encounter (Signed)
Patient Information:  Caller Name: Andrena  Phone: [phone]  Patient: Erika Weaver, Erika Weaver  Gender: Female  DOB: [birth year]  Age: 38 Years  PCP: Penni Homans [facility])  Pregnant: No  Office Follow Up:  Does the office need to follow up with this patient?: No  Instructions For The Office: N/A   Symptoms  Reason For Call & Symptoms: Sudden vision change -blurry vision; relates she has ongoing dizziness. She has not taken BP meds today.  Emergent symptoms ruled out.   Go to ED Now ( or to Office with PCP Approval).  Sent ot ED per nursing judgment due to Patient sounds very sick or weak to triager.  She agreed to go to [facility] ED and have someone else drive for her.  Reviewed Health History In EMR: Yes  Reviewed Medications In EMR: Yes  Reviewed Allergies In EMR: Yes  Reviewed Surgeries / Procedures: Yes  Date of Onset of Symptoms: 09/01/2014 OB / GYN:  LMP: 08/26/2014  Guideline(s) Used:  Neurologic Deficit  Disposition Per Guideline:   Go to ED Now (or to Office with PCP Approval)  Reason For Disposition Reached:   Patient sounds very sick or weak to the triager  Advice Given:  N/A  RN Overrode Recommendation:  Go To ED  Possible stroke with sudden onset blurry vision.  Sent to [facility] for evaluation.

## 2014-09-01 NOTE — ED Notes (Signed)
PT STATES SHE HAD NOT ATE THIS AM ABD FELT DIZZY AND LIGHTHEADED WITH "SPOTS BEFORE MY EYES" ON WAY TO [employer]AUR[employer] TO HAVE LUNCH PT DID EAT THEN

## 2014-09-01 NOTE — Telephone Encounter (Signed)
FYI

## 2014-09-01 NOTE — ED Notes (Signed)
Remains in mri

## 2014-09-01 NOTE — ED Notes (Signed)
Pt to mri 

## 2014-09-01 NOTE — ED Notes (Signed)
stays lightheaded and dizzy all the time .she states today  was on the way to meet hubby for lunch her vision became blurry and  Dizzy and her hand s started to get be tingling

## 2014-09-01 NOTE — ED Notes (Signed)
Soda given

## 2014-09-01 NOTE — ED Notes (Signed)
Returned from [employer]

## 2014-09-01 NOTE — Discharge Instructions (Signed)
Please start taking a baby aspirin (63m) once a day. Please follow up with Dr. BCharlett Blakein 2-3 days.  Blurred Vision You have been seen today complaining of blurred vision. This means you have a loss of ability to see small details.  CAUSES  Blurred vision can be a symptom of underlying eye problems, such as:  Aging of the eye (presbyopia).  Glaucoma.  Cataracts.  Eye infection.  Eye-related migraine.  Diabetes mellitus.  Fatigue.  Migraine headaches.  High blood pressure.  Breakdown of the back of the eye (macular degeneration).  Problems caused by some medications. The most common cause of blurred vision is the need for eyeglasses or a new prescription. Today in the emergency department, no cause for your blurred vision can be found. SYMPTOMS  Blurred vision is the loss of visual sharpness and detail (acuity). DIAGNOSIS  Should blurred vision continue, you should see your caregiver. If your caregiver is your primary care physician, he or she may choose to refer you to another specialist.  TREATMENT  Do not ignore your blurred vision. Make sure to have it checked out to see if further treatment or referral is necessary. SEEK MEDICAL CARE IF:  You are unable to get into a specialist so we can help you with a referral. SEEK IMMEDIATE MEDICAL CARE IF: You have severe eye pain, severe headache, or sudden loss of vision. MAKE SURE YOU:   Understand these instructions.  Will watch your condition.  Will get help right away if you are not doing well or get worse. Document Released: 10/11/2003 Document Revised: 12/31/2011 Document Reviewed: 05/12/2008 EScheurer HospitalPatient Information 2015 [place] This information is not intended to replace advice given to you by your health care provider. Make sure you discuss any questions you have with your health care provider.

## 2014-09-02 NOTE — ED Provider Notes (Signed)
CSN: [id number]     Arrival date & time 09/01/14  1251 History   First MD Initiated Contact with Patient 09/01/14 1319     No chief complaint on file.    (Consider location/radiation/quality/duration/timing/severity/associated sxs/prior Treatment) Patient is a 38 y.o. female presenting with neurologic complaint. The history is provided by the patient.  Neurologic Problem This is a new problem. The current episode started 1 to 2 hours ago. Episode frequency: constant, once. The problem has been resolved. Pertinent negatives include no abdominal pain and no shortness of breath. Nothing aggravates the symptoms. Nothing relieves the symptoms.    Past Medical History  Diagnosis Date  . Chicken pox as a child  . Overweight(278.02)   . Headache(784.0) 11/22/2011  . Allergic state 11/22/2011  . Visual changes 11/22/2011  . Elevated BP 11/22/2011  . Anxiety 11/22/2011  . Sinusitis 11/22/2011  . Plantar fasciitis 01/11/2012  . Fatigue 01/11/2012  . Pharyngitis 01/11/2012  . Sleep apnea 04/03/2012    setting 2  . Chronic kidney disease     kidney stones  . Hypertension   . Kidney stone 12/21/2012  . Back pain 01/31/2013  . 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 Dehydration 01/31/2013  . Preventative health care 08/18/2013  . BPV (benign positional vertigo) 08/23/2013  . Other and unspecified hyperlipidemia 03/07/2014   Past Surgical History  Procedure Laterality Date  . Gum surgery    . Wisdom tooth extraction    . Perineal body repair      4 th degree tear with fistula between vagina and retum requiring 3 surgeries for correction   Family History  Problem Relation Age of Onset  . Diabetes Mother     type 2  . Hypertension Mother   . Hyperlipidemia Mother   . Hypertension Father   . Hyperlipidemia Father   . Aneurysm Father     abdominal aorta  . Cancer Maternal Grandmother 82    colon, ovarian  . Other Maternal Grandfather 41    brain hemorrhage  . Stroke Maternal Grandfather   .  Aneurysm Maternal Grandfather   . Heart attack Paternal Grandmother   . Heart disease Paternal Grandmother     MI  . Alzheimer's disease Paternal Grandfather   . Dementia Paternal Grandfather   . Heart attack Paternal Aunt   . Heart disease Paternal Aunt      AAA rupture  . Other Paternal Uncle     cerebreal brain hemorrhage  . Stroke Paternal Uncle   . Aneurysm Paternal Uncle   . Asthma Daughter   . Other Daughter     peanut allergy  . Heart disease Maternal Aunt     s/p cabg. brain aneurysm s/p coiling  . Aneurysm Maternal Aunt     coil behind eye   History  Substance Use Topics  . Smoking status: Never Smoker   . Smokeless tobacco: Never Used  . Alcohol Use: No   OB History    No data available     Review of Systems  Constitutional: Negative for fever.  Respiratory: Negative for cough and shortness of breath.   Gastrointestinal: Negative for vomiting and abdominal pain.  All other systems reviewed and are negative.     Allergies  Review of patient's allergies indicates no known allergies.  Home Medications   Prior to Admission medications   Medication Sig Start Date End Date Taking? Authorizing Provider  amLODipine (NORVASC) 5 MG tablet Take 1 tablet (5 mg total) by mouth daily. 11/24/13  Yes Mosie Lukes, MD  ibuprofen (ADVIL,MOTRIN) 200 MG tablet Take 400 mg by mouth every 6 (six) hours as needed for mild pain or moderate pain.   Yes Historical Provider, MD   BP 140/79 mmHg  Pulse 78  Temp(Src) 98.3 F (36.8 C) (Oral)  Resp 16  SpO2 97% Physical Exam  Constitutional: She is oriented to person, place, and time. She appears well-developed and well-nourished. No distress.  HENT:  Head: Normocephalic and atraumatic.  Mouth/Throat: Oropharynx is clear and moist.  Eyes: EOM are normal. Pupils are equal, round, and reactive to light.  Neck: Normal range of motion. Neck supple.  Cardiovascular: Normal rate and regular rhythm.  Exam reveals no friction rub.    No murmur heard. Pulmonary/Chest: Effort normal and breath sounds normal. No respiratory distress. She has no wheezes. She has no rales.  Abdominal: Soft. She exhibits no distension. There is no tenderness. There is no rebound.  Musculoskeletal: Normal range of motion. She exhibits no edema.  Neurological: She is alert and oriented to person, place, and time. No cranial nerve deficit or sensory deficit. She exhibits normal muscle tone. Coordination and gait normal. GCS eye subscore is 4. GCS verbal subscore is 5. GCS motor subscore is 6.  Skin: She is not diaphoretic.  Nursing note and vitals reviewed.   ED Course  Procedures (including critical care time) Labs Review Labs Reviewed  CBG MONITORING, ED - Abnormal; Notable for the following:    Glucose-Capillary 122 (*)    All other components within normal limits    Imaging Review Mr Brain Wo Contrast  09/01/2014   CLINICAL DATA:  Vision changes. Persistent dizziness and lightheadedness. Tingling in the hands.  EXAM: MRI HEAD WITHOUT CONTRAST  TECHNIQUE: Multiplanar, multiecho pulse sequences of the brain and surrounding structures were obtained without intravenous contrast.  COMPARISON:  MRI brain 02/20/2014.  FINDINGS: No acute infarct, hemorrhage, or mass lesion is present. The ventricles are of normal size. No significant extraaxial fluid collection is present.  Flow is present in the major intracranial arteries. No significant white matter disease is evident. Midline structures are within normal limits. Mild mucosal thickening is present within the ethmoid air cells bilaterally. The mastoid air cells are clear.  IMPRESSION: 1. Normal MRI appearance the brain. 2. Mild ethmoid sinus disease.   Electronically Signed   By: Lawrence Santiago M.D.   On: 09/01/2014 15:38     EKG Interpretation   Date/Time:  Wednesday September 01 2014 13:08:07 EST Ventricular Rate:  83 PR Interval:  156 QRS Duration: 82 QT Interval:  372 QTC Calculation:  437 R Axis:   56 Text Interpretation:  Normal sinus rhythm Normal ECG Similar to prior  Confirmed by Mingo Amber  MD, [place] ([id number]) on 09/01/2014 1:26:09 PM      MDM   Final diagnoses:  Vision changes    41-year-old female here with vision change. Had episode of binocular blurry type vision described as "like I'm looking through a bubble." Symptoms resolved after about 20 minutes. No prior issues like this before. She has chronic dizziness and lightheadedness, had some of this with this episode, but is not different than normal. Neuro exam benign here. No risk factors other than mild hypertension. MRI is negative for a type of stroke. Instructed to take a baby aspirin a day and follow-up with her primary care doctor.    Evelina Bucy, MD 09/02/14 [phone]

## 2014-09-06 ENCOUNTER — Ambulatory Visit: Payer: BC Managed Care – PPO | Attending: Otolaryngology | Admitting: Physical Therapy

## 2014-09-06 ENCOUNTER — Encounter: Payer: Self-pay | Admitting: Physical Therapy

## 2014-09-06 VITALS — BP 140/93 | HR 82

## 2014-09-06 DIAGNOSIS — R42 Dizziness and giddiness: Secondary | ICD-10-CM | POA: Diagnosis not present

## 2014-09-06 DIAGNOSIS — Z5189 Encounter for other specified aftercare: Secondary | ICD-10-CM | POA: Diagnosis not present

## 2014-09-06 NOTE — Therapy (Signed)
Physical Therapy Evaluation  Patient Details  Name: Erika Weaver MRN: [id number] Date of Birth: 11/27/1975  Encounter Date: 09/06/2014      PT End of Session - 09/06/14 1522    Visit Number 1   Number of Visits 4   Date for PT Re-Evaluation 10/21/14   Authorization Type BCBS   Authorization - Visit Number 1   Authorization - Number of Visits 30   PT Start Time 1401   PT Stop Time 1504   PT Time Calculation (min) 63 min   Equipment Utilized During Treatment --  vest with SOT testing   Activity Tolerance Patient tolerated treatment well      Past Medical History  Diagnosis Date  . Chicken pox as a child  . Overweight(278.02)   . Headache(784.0) 11/22/2011  . Allergic state 11/22/2011  . Visual changes 11/22/2011  . Elevated BP 11/22/2011  . Anxiety 11/22/2011  . Sinusitis 11/22/2011  . Plantar fasciitis 01/11/2012  . Fatigue 01/11/2012  . Pharyngitis 01/11/2012  . Sleep apnea 04/03/2012    setting 2  . Chronic kidney disease     kidney stones  . Hypertension   . Kidney stone 12/21/2012  . Back pain 01/31/2013  . 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 Dehydration 01/31/2013  . Preventative health care 08/18/2013  . BPV (benign positional vertigo) 08/23/2013  . Other and unspecified hyperlipidemia 03/07/2014    Past Surgical History  Procedure Laterality Date  . Gum surgery    . Wisdom tooth extraction    . Perineal body repair      4 th degree tear with fistula between vagina and retum requiring 3 surgeries for correction    BP 140/93 mmHg  Pulse 82  SpO2 97%  Visit Diagnosis:  Dizziness and giddiness - Plan: PT plan of care cert/re-cert      Subjective Assessment - 09/06/14 1411    Symptoms pt. reports lightheadedness/dizziness constantly; reports she has experienced light-headedness for past 3 years; started as light-headedness  pt. states progressively worsened over past 3 years;  denies tiinnitus ; denies nausea and vomiting   Pertinent History pt. reports 3  year history of light-headedness/dizziness; reports intensity 5-6/10 at current time (typical)   Diagnostic tests MRI's (3) have been done within past 3 years; most recent one last Wed.; with/without contrast   Patient Stated Goals Resolve the dizziness   Currently in Pain? No/denies          [facility] PT Assessment - 09/06/14 1422    Assessment   Medical Diagnosis light-headedness; dizziness   Onset Date --  3 years ago   Next MD Visit not scheduled   Balance Screen   Has the patient fallen in the past 6 months No   Has the patient had a decrease in activity level because of a fear of falling?  No   Is the patient reluctant to leave their home because of a fear of falling?  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 leaving Fri., 11-20 for [place] Tests --  Dynamic Visual acuity test 1 line difference (WNL's)    Balance Master Testing    Results Composite score 58/100 with N= 70/100:  somatorsensory input is WNL's, visual input 55/100with N=73/100 and vestibular input is 37/100 with N=55/100  pt. c/o some mild incr. symptoms after testing     Oculomotor testing WNL's with no abnormalities for horizontal or vertical smooth pursuits nor with saccades; pt. Reported no incr. Symptoms with this testing; Positional testing normal with no c/o vertigo with sit to sidelying both right and left sidelying. Pt. Did c/o light-headedness with return to upright position from sidelying.  BP 143/90 at beginning of eval and 139/92 at end of evaluation 45 minutes later.       PT Education - 09/06/14 1521    Education provided Yes   Education Details discussed and explained results of SOT with pt.: instructed to perform exercises with EC & with head turns while standing on compliant surface to incr. vestibular  input   Person(s) Educated Patient   Methods Explanation;Demonstration   Comprehension Verbalized understanding          PT Short Term Goals - 09/06/14 1531    PT SHORT TERM GOAL #1   Title same as LTG's          PT Long Term Goals - 09/06/14 1531    PT LONG TERM GOAL #1   Title Pt. will have normal composite score of SOT to demo improved vestibular function.   Baseline 10-13-14   Time 4   Period Weeks   Status On-going   PT LONG TERM GOAL #2   Title Increase DHI to </= 25% for improvement in vertigo   Baseline 10-13-14   Time 4   Period Weeks   Status On-going   PT LONG TERM GOAL #3   Title Independent in HEP for vestibular exercises.   Baseline 10-13-14   Time 4   Period Weeks   Status On-going          Plan - 09/06/14 1524    Clinical Impression Statement pt.'s etiology of light-headedness/dizziness does not appear to be of vestibular system dysfunction - possibly attributed to HTN as BP high during evaluation  pt.'s deficits per SOT do not appear to be significant enough to produce constant symptoms of vertigo; appears to be of some other medical etiology   Pt will benefit from skilled therapeutic intervention in order to improve on the following deficits Other (comment)  vestibular dysfunction   Rehab Potential Good   PT Frequency 1x / week   PT Duration 4 weeks   PT Treatment/Interventions Patient/family education;Other (comment);Therapeutic activities;Balance training  vestibular rehab   PT Next Visit Plan give pictures of balance of foam for HEP   PT Home Exercise Plan Standing on compiant surface   Consulted and Agree with Plan of Care Patient        Problem List Patient Active Problem List   Diagnosis Date Noted  . Other and unspecified hyperlipidemia 03/07/2014  . Otitis media 02/14/2014  . BPV (benign positional vertigo) 08/23/2013  . Preventative health care 08/18/2013  . Acute low back pain 07/06/2013  . Back pain 01/31/2013  . Rectal  vaginal fistula 01/31/2013  . Dehydration 01/31/2013  . Kidney stone 12/21/2012  . Sleep apnea 04/03/2012  . Plantar fasciitis 01/11/2012  . Fatigue 01/11/2012  . Headache 11/22/2011  . Allergic state 11/22/2011  . Visual changes 11/22/2011  . HTN (hypertension) 11/22/2011  . Anxiety 11/22/2011  . Overweight  Guido Sander, [place] [address] [place], [place] [address] [phone]    Alda Lea 09/06/2014, 3:40 PM

## 2014-09-09 ENCOUNTER — Encounter: Payer: Self-pay | Admitting: Family Medicine

## 2014-09-09 ENCOUNTER — Ambulatory Visit (INDEPENDENT_AMBULATORY_CARE_PROVIDER_SITE_OTHER): Payer: BC Managed Care – PPO | Admitting: Family Medicine

## 2014-09-09 VITALS — BP 126/87 | HR 70 | Temp 98.7°F | Resp 18 | Ht 65.75 in | Wt 226.0 lb

## 2014-09-09 DIAGNOSIS — E878 Other disorders of electrolyte and fluid balance, not elsewhere classified: Secondary | ICD-10-CM

## 2014-09-09 DIAGNOSIS — I10 Essential (primary) hypertension: Secondary | ICD-10-CM | POA: Insufficient documentation

## 2014-09-09 DIAGNOSIS — R42 Dizziness and giddiness: Secondary | ICD-10-CM

## 2014-09-09 MED ORDER — SCOPOLAMINE 1 MG/3DAYS TD PT72: 1.0000 | MEDICATED_PATCH | TRANSDERMAL | Status: DC

## 2014-09-09 MED ORDER — DIAZEPAM 10 MG PO TABS: ORAL_TABLET | ORAL | Status: DC

## 2014-09-09 MED ORDER — MECLIZINE HCL 25 MG PO TABS: 25.0000 mg | ORAL_TABLET | Freq: Three times a day (TID) | ORAL | Status: DC | PRN

## 2014-09-09 NOTE — Progress Notes (Signed)
OFFICE NOTE  09/09/2014  CC:  Chief Complaint  Patient presents with  . Dizziness    x 3 years.  . Hypertension   HPI: Patient is a 38 y.o. Caucasian female who is here for disequilibrium syndrome and HTN. Disequilibrium has been an issue for 3 yrs or so and no one has been able to figure out the etiology.   She started seeing a PT yesterday and the PT was concerned it may be her bp or her bp med that is the problem. She has seen an ENT, has had 3 brain MRI's, ophthalmologist, neurologist, and HA specialist: pt says no one can explain why she feels dizziness. She describes the sensation as "swimmy headed" and it is not vertigo.  She says it is constant and is not made worse by anything or better by anything.  Onset about 3 yrs ago, seemed to ease up some for a while, then it returned severe again about a year ago.   She has not tried any med or been rx'd any med for this.   She was put on bp med about 2 yrs ago, then a switch in bp med was tried once and this didn't help. No T/A/Ds.   She walks some for exercise.  Takes OTC ibupr occasionally. She teaches preschool.  Her monitoring of bp's at home: systolic 161W or less.  Diast usually 70s, but a couple of measurements recently in 58s and 90s. P 101 yesterday.  Pertinent PMH:  Past medical, surgical,  Hx reviewed.  MEDS:  Outpatient Prescriptions Prior to Visit  Medication Sig Dispense Refill  . amLODipine (NORVASC) 5 MG tablet Take 1 tablet (5 mg total) by mouth daily. 90 tablet 3  . ibuprofen (ADVIL,MOTRIN) 200 MG tablet Take 400 mg by mouth every 6 (six) hours as needed for mild pain or moderate pain.     No facility-administered medications prior to visit.    PE: Blood pressure 126/87, pulse 70, temperature 98.7 F (37.1 C), temperature source Temporal, resp. rate 18, height 5' 5.75" (1.67 m), weight 226 lb (102.513 kg), SpO2 96 %. Gen: Alert, well appearing.  Patient is oriented to person, place, time, and  situation. RUE:AVWU: no injection, icteris, swelling, or exudate.  EOMI, PERRLA. Mouth: lips without lesion/swelling.  Oral mucosa pink and moist. Oropharynx without erythema, exudate, or swelling.  CV: RRR, no m/r/g.   LUNGS: CTA bilat, nonlabored resps, good aeration in all lung fields. Neuro: CN 2-12 intact bilaterally, strength 5/5 in proximal and distal upper extremities and lower extremities bilaterally.  No sensory deficits.  No tremor.  No disdiadochokinesis.  No ataxia.  Upper extremity and lower extremity DTRs symmetric.  No pronator drift.   IMPRESSION AND PLAN:  1) Chronic disequilibrium: unclear etiology despite extensive w/u. Will do trial of meclizine 52m q8h prn. If this doesn't help, try diazepam 111m 1/2-1 tab q12h prn, #30, RF x 1. BEFORE she tries this, she'll be going on a cruise, so she'll use scopolamine patches during this time.  2) HTN; The current medical regimen is effective;  continue present plan and medications. I reassured her that her bp and her bp med have nothing to do with her chronic disequilibrium syndrome.  An After Visit Summary was printed and given to the patient.  FOLLOW UP: prn

## 2014-09-09 NOTE — Progress Notes (Signed)
Pre visit review using our clinic review tool, if applicable. No additional management support is needed unless otherwise documented below in the visit note. 

## 2014-09-10 ENCOUNTER — Telehealth: Payer: Self-pay | Admitting: Family Medicine

## 2014-09-10 NOTE — Telephone Encounter (Signed)
emmi emailed °

## 2014-09-23 ENCOUNTER — Encounter: Payer: Self-pay | Admitting: Physical Therapy

## 2014-09-23 ENCOUNTER — Ambulatory Visit: Payer: BC Managed Care – PPO | Attending: Otolaryngology | Admitting: Physical Therapy

## 2014-09-23 VITALS — BP 136/88

## 2014-09-23 DIAGNOSIS — Z5189 Encounter for other specified aftercare: Secondary | ICD-10-CM | POA: Diagnosis not present

## 2014-09-23 DIAGNOSIS — R42 Dizziness and giddiness: Secondary | ICD-10-CM | POA: Diagnosis not present

## 2014-09-23 NOTE — Therapy (Signed)
[facility] [address] [place], [place], [address] Phone: [phone]   Fax:  [phone]  Physical Therapy Treatment  Patient Details  Name: Erika Weaver MRN: [id number] Date of Birth: 1975/11/05  Encounter Date: 09/23/2014      PT End of Session - 09/23/14 1415    Visit Number 2   Number of Visits 4   Date for PT Re-Evaluation 10/21/14   Authorization Type BCBS   Authorization - Visit Number 2   Authorization - Number of Visits 30   PT Start Time 0932   PT Stop Time 0931   PT Time Calculation (min) 45 min      Past Medical History  Diagnosis Date  . Chicken pox as a child  . Overweight(278.02)   . Headache(784.0) 11/22/2011  . Allergic state 11/22/2011  . Visual changes 11/22/2011  . Elevated BP 11/22/2011  . Anxiety 11/22/2011  . Sinusitis 11/22/2011  . Plantar fasciitis 01/11/2012  . Fatigue 01/11/2012  . Pharyngitis 01/11/2012  . Sleep apnea 04/03/2012    setting 2  . Chronic kidney disease     kidney stones  . Hypertension   . Kidney stone 12/21/2012  . Back pain 01/31/2013  . 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 Dehydration 01/31/2013  . Preventative health care 08/18/2013  . BPV (benign positional vertigo) 08/23/2013  . Other and unspecified hyperlipidemia 03/07/2014    Past Surgical History  Procedure Laterality Date  . Gum surgery    . Wisdom tooth extraction    . Perineal body repair      4 th degree tear with fistula between vagina and retum requiring 3 surgeries for correction    BP 136/88 mmHg  Visit Diagnosis:  Dizziness and giddiness      Subjective Assessment - 09/23/14 1306    Symptoms Pt. reports no change in light-headedness since initial evaluation. Saw Dr. Anitra Lauth on Thurs, 11-19 and she says he did not think symptoms were result of high BP. He prescribed meclizine which she took until Tues. of this week   Pertinent History pt. reports 3 year history of light-headedness/dizziness; reports  intensity 5-6/10 at current time (typical)   Patient Stated Goals Resolve the dizziness   Currently in Pain? Yes   Pain Score 5    Pain Location Neck   Pain Orientation Other (Comment)  middle of back of neck   Pain Descriptors / Indicators Tightness   Pain Type Chronic pain   Pain Onset More than a month ago   Pain Frequency Intermittent   Multiple Pain Sites No      pt. Rates intensity of light-headedness 8/10 at start of PT session;  Pt. Performed balance on foam with feet apart and feet together with EO and EC; head turns added with EO with targets to address gaze stabilization deficits.       PT Education - 09/23/14 1412    Education provided Yes   Education Details Balance on foam and gaze stabilization exercises for HEP; instructed pt how to progress to patterned background and on compliant surface for balance on foam   Person(s) Educated Patient   Methods Explanation;Demonstration   Comprehension Verbalized understanding;Returned demonstration              Plan - 09/23/14 1416    Clinical Impression Statement pt. does have symptoms of some decreased vestibular input in maintaining balance on compliant surface as evidenced by significant sway and decr. VOR with gaze stabilization exercise   Pt  will benefit from skilled therapeutic intervention in order to improve on the following deficits Other (comment)  decreased vestibular function   Rehab Potential Good   PT Frequency 1x / week   PT Duration 4 weeks   PT Treatment/Interventions Patient/family education;Other (comment);Therapeutic activities;Balance training   PT Next Visit Plan check HEP   PT Home Exercise Plan Standing on compiant surface   Consulted and Agree with Plan of Care Patient                Vestibular Treatment/Exercise - 09/23/14 1409    Gaze Exercises X1 Viewing Horizontal;X1 Viewing Vertical  in standing with target on plain background   Foot Position shoulder width apart   Time  --  30 secs.   Comments c/o incr. vertigo   Foot Position shoulder width   Time --  30 secs   Comments c/o vertigo                       Problem List Patient Active Problem List   Diagnosis Date Noted  . Disequilibrium syndrome 09/09/2014  . Essential hypertension 09/09/2014  . Other and unspecified hyperlipidemia 03/07/2014  . Otitis media 02/14/2014  . BPV (benign positional vertigo) 08/23/2013  . Preventative health care 08/18/2013  . Acute low back pain 07/06/2013  . Back pain 01/31/2013  . 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 Dehydration 01/31/2013  . Kidney stone 12/21/2012  . Sleep apnea 04/03/2012  . Plantar fasciitis 01/11/2012  . Fatigue 01/11/2012  . Headache 11/22/2011  . Allergic state 11/22/2011  . Visual changes 11/22/2011  . HTN (hypertension) 11/22/2011  . Anxiety 11/22/2011  . Overweight     Micca Matura, [place] 09/23/2014, 2:22 PM   Guido Sander, [place] [address] [place], [place] [address] [phone]

## 2014-09-23 NOTE — Patient Instructions (Signed)
Gaze Stabilization: Tip Card 1.Target must remain in focus, not blurry, and appear stationary while head is in motion. 2.Perform exercises with small head movements (45 to either side of midline). 3.Increase speed of head motion so long as target is in focus. 4.If you wear eyeglasses, be sure you can see target through lens (therapist will give specific instructions for bifocal / progressive lenses). 5.These exercises may provoke dizziness or nausea. Work through these symptoms. If too dizzy, slow head movement slightly. Rest between each exercise. 6.Exercises demand concentration; avoid distractions. 7.For safety, perform standing exercises close to a counter, wall, corner, or next to someone.  Copyright  VHI. All rights reserved.  Gaze Stabilization: Standing Feet Apart   Feet shoulder width apart, keeping eyes on target on wall ____ feet away, tilt head down 15-30 and move head side to side for ____ seconds. Repeat while moving head up and down for ____ seconds. Do ____ sessions per day. Repeat using target on pattern background.  Copyright  VHI. All rights reserved.  Gaze Stabilization: Tip Card 1.Target must remain in focus, not blurry, and appear stationary while head is in motion. 2.Perform exercises with small head movements (45 to either side of midline). 3.Increase speed of head motion so long as target is in focus. 4.If you wear eyeglasses, be sure you can see target through lens (therapist will give specific instructions for bifocal / progressive lenses). 5.These exercises may provoke dizziness or nausea. Work through these symptoms. If too dizzy, slow head movement slightly. Rest between each exercise. 6.Exercises demand concentration; avoid distractions. 7.For safety, perform standing exercises close to a counter, wall, corner, or next to someone.  Copyright  VHI. All rights reserved.  Feet Apart (Compliant Surface) Head Motion - Eyes Open   With eyes open, standing  on compliant surface: ________, feet shoulder width apart, move head slowly: up and down. Repeat ____ times per session. Do ____ sessions per day.  Copyright  VHI. All rights reserved.  Feet Together, Varied Arm Positions - Eyes Closed   Stand with feet together and arms out. Close eyes and visualize upright position. Hold ____ seconds. Repeat ____ times per session. Do ____ sessions per day.  Copyright  VHI. All rights reserved.

## 2014-10-28 ENCOUNTER — Encounter: Payer: Self-pay | Admitting: Physical Therapy

## 2014-10-28 ENCOUNTER — Ambulatory Visit: Payer: BLUE CROSS/BLUE SHIELD | Attending: Otolaryngology | Admitting: Physical Therapy

## 2014-10-28 DIAGNOSIS — R42 Dizziness and giddiness: Secondary | ICD-10-CM | POA: Diagnosis not present

## 2014-10-28 DIAGNOSIS — Z5189 Encounter for other specified aftercare: Secondary | ICD-10-CM | POA: Insufficient documentation

## 2014-10-28 NOTE — Therapy (Signed)
[facility] [address] [place], [place], [address] Phone: [phone]   Fax:  [phone]  Physical Therapy Treatment  Patient Details  Name: Erika Weaver MRN: [id number] Date of Birth: 07-30-1976 Referring Provider:  Mosie Lukes, MD  Encounter Date: 10/28/2014 PHYSICAL THERAPY DISCHARGE SUMMARY  Visits from Start of Care: 3  Current functional level related to goals / functional outcomes: See below for progress towards LTG's   Remaining deficits: None regarding vertigo    Education / Equipment: Pt. Has been instructed in a HEP for vestibular exercises Plan: Patient agrees to discharge.  Patient goals were met. Patient is being discharged due to meeting the stated rehab goals.  ?????         PT End of Session - 10/28/14 1741    Visit Number 3   Number of Visits 4   Date for PT Re-Evaluation 10/21/14   Authorization Type [employer]   Authorization - Visit Number 2   PT Start Time 1105   PT Stop Time 1145   PT Time Calculation (min) 40 min      Past Medical History  Diagnosis Date  . Chicken pox as a child  . Overweight(278.02)   . Headache(784.0) 11/22/2011  . Allergic state 11/22/2011  . Visual changes 11/22/2011  . Elevated BP 11/22/2011  . Anxiety 11/22/2011  . Sinusitis 11/22/2011  . Plantar fasciitis 01/11/2012  . Fatigue 01/11/2012  . Pharyngitis 01/11/2012  . Sleep apnea 04/03/2012    setting 2  . Chronic kidney disease     kidney stones  . Hypertension   . Kidney stone 12/21/2012  . Back pain 01/31/2013  . 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 Dehydration 01/31/2013  . Preventative health care 08/18/2013  . BPV (benign positional vertigo) 08/23/2013  . Other and unspecified hyperlipidemia 03/07/2014    Past Surgical History  Procedure Laterality Date  . Gum surgery    . Wisdom tooth extraction    . Perineal body repair      4 th degree tear with fistula between vagina and retum requiring 3  surgeries for correction    There were no vitals taken for this visit.  Visit Diagnosis:  Dizziness and giddiness      Subjective Assessment - 10/28/14 1737    Symptoms Reports no dizziness within past week; pt. has not taken meclizine within past month   Pertinent History pt. reports 3 year history of light-headedness/dizziness; reports intensity 5-6/10 at current time (typical)   Diagnostic tests MRI's (3) have been done within past 3 years; most recent one last Wed.; with/without contrast   Patient Stated Goals Resolve the dizziness   Currently in Pain? No/denies    Neuro Re-ed: Sensory Organization test completed - score WNL's with all inputs WNL's - score 70 with N=70/100; all inputs WNL's Dynamic visual acuity test 1 line difference with no c/o vertigo with testing  Pt. Reports no vertigo within past week - reviewed LTG's - pt. Agrees that goals met and no further PT is needed at this time  Pt. Completed FOTO - DHI score 0% - this is a self-perceived disability rating scale of 0% disability at this time due to no vertigo                     PT Short Term Goals - 09/06/14 1531    PT SHORT TERM GOAL #1   Title same as LTG's  PT Long Term Goals - 10/28/14 1133    PT LONG TERM GOAL #1   Title Pt. will have normal composite score of SOT to demo improved vestibular function.   Baseline met 10-28-14   Time 1   Period Days   Status Achieved   PT LONG TERM GOAL #2   Title Increase DHI to </= 25% for improvement in vertigo   Baseline DHI score 0% 10-28-14   Time 1   Period Days   Status Achieved   PT LONG TERM GOAL #3   Title Independent in HEP for vestibular exercises.   Baseline met 10-28-14   Time 1   Period Days   Status Achieved               Plan - 10/28/14 1743    Clinical Impression Statement vertigo has resolved - SOT score WNL's        Problem List Patient Active Problem List   Diagnosis Date Noted  . Disequilibrium  syndrome 09/09/2014  . Essential hypertension 09/09/2014  . Other and unspecified hyperlipidemia 03/07/2014  . Otitis media 02/14/2014  . BPV (benign positional vertigo) 08/23/2013  . Preventative health care 08/18/2013  . Acute low back pain 07/06/2013  . Back pain 01/31/2013  . 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 Dehydration 01/31/2013  . Kidney stone 12/21/2012  . Sleep apnea 04/03/2012  . Plantar fasciitis 01/11/2012  . Fatigue 01/11/2012  . Headache 11/22/2011  . Allergic state 11/22/2011  . Visual changes 11/22/2011  . HTN (hypertension) 11/22/2011  . Anxiety 11/22/2011  . Overweight     Deren Degrazia, [place], PT 10/28/2014, 5:45 PM  [facility] [address] [place], [place], [address] Phone: [phone]   Fax:  [phone]

## 2014-11-26 ENCOUNTER — Encounter (HOSPITAL_BASED_OUTPATIENT_CLINIC_OR_DEPARTMENT_OTHER): Payer: Self-pay | Admitting: *Deleted

## 2014-11-26 ENCOUNTER — Emergency Department (HOSPITAL_BASED_OUTPATIENT_CLINIC_OR_DEPARTMENT_OTHER): Payer: BLUE CROSS/BLUE SHIELD

## 2014-11-26 ENCOUNTER — Emergency Department (HOSPITAL_BASED_OUTPATIENT_CLINIC_OR_DEPARTMENT_OTHER)
Admission: EM | Admit: 2014-11-26 | Discharge: 2014-11-26 | Disposition: A | Discharge status: Home / Self Care | Payer: BLUE CROSS/BLUE SHIELD | Attending: Emergency Medicine | Admitting: Emergency Medicine

## 2014-11-26 DIAGNOSIS — Z87442 Personal history of urinary calculi: Secondary | ICD-10-CM | POA: Insufficient documentation

## 2014-11-26 DIAGNOSIS — Z8669 Personal history of other diseases of the nervous system and sense organs: Secondary | ICD-10-CM | POA: Diagnosis not present

## 2014-11-26 DIAGNOSIS — Z3202 Encounter for pregnancy test, result negative: Secondary | ICD-10-CM | POA: Diagnosis not present

## 2014-11-26 DIAGNOSIS — Z8709 Personal history of other diseases of the respiratory system: Secondary | ICD-10-CM | POA: Insufficient documentation

## 2014-11-26 DIAGNOSIS — E663 Overweight: Secondary | ICD-10-CM | POA: Diagnosis not present

## 2014-11-26 DIAGNOSIS — N189 Chronic kidney disease, unspecified: Secondary | ICD-10-CM | POA: Diagnosis not present

## 2014-11-26 DIAGNOSIS — Z8639 Personal history of other endocrine, nutritional and metabolic disease: Secondary | ICD-10-CM | POA: Diagnosis not present

## 2014-11-26 DIAGNOSIS — Z8619 Personal history of other infectious and parasitic diseases: Secondary | ICD-10-CM | POA: Diagnosis not present

## 2014-11-26 DIAGNOSIS — R42 Dizziness and giddiness: Secondary | ICD-10-CM | POA: Insufficient documentation

## 2014-11-26 DIAGNOSIS — I129 Hypertensive chronic kidney disease with stage 1 through stage 4 chronic kidney disease, or unspecified chronic kidney disease: Secondary | ICD-10-CM | POA: Insufficient documentation

## 2014-11-26 DIAGNOSIS — R079 Chest pain, unspecified: Secondary | ICD-10-CM | POA: Diagnosis not present

## 2014-11-26 DIAGNOSIS — E878 Other disorders of electrolyte and fluid balance, not elsewhere classified: Secondary | ICD-10-CM

## 2014-11-26 DIAGNOSIS — R0789 Other chest pain: Secondary | ICD-10-CM

## 2014-11-26 LAB — URINE MICROSCOPIC-ADD ON

## 2014-11-26 LAB — CBC WITH DIFFERENTIAL/PLATELET
BASOS PCT: 0 % (ref 0–1)
Basophils Absolute: 0 10*3/uL (ref 0.0–0.1)
Eosinophils Absolute: 0.1 10*3/uL (ref 0.0–0.7)
Eosinophils Relative: 1 % (ref 0–5)
HCT: 42.8 % (ref 36.0–46.0)
Hemoglobin: 14.6 g/dL (ref 12.0–15.0)
LYMPHS PCT: 17 % (ref 12–46)
Lymphs Abs: 2.2 10*3/uL (ref 0.7–4.0)
MCH: 31.1 pg (ref 26.0–34.0)
MCHC: 34.1 g/dL (ref 30.0–36.0)
MCV: 91.3 fL (ref 78.0–100.0)
MONOS PCT: 6 % (ref 3–12)
Monocytes Absolute: 0.7 10*3/uL (ref 0.1–1.0)
NEUTROS ABS: 10.2 10*3/uL — AB (ref 1.7–7.7)
NEUTROS PCT: 76 % (ref 43–77)
PLATELETS: 275 10*3/uL (ref 150–400)
RBC: 4.69 MIL/uL (ref 3.87–5.11)
RDW: 12.4 % (ref 11.5–15.5)
WBC: 13.2 10*3/uL — AB (ref 4.0–10.5)

## 2014-11-26 LAB — URINALYSIS, ROUTINE W REFLEX MICROSCOPIC
Bilirubin Urine: NEGATIVE
Glucose, UA: NEGATIVE mg/dL
Hgb urine dipstick: NEGATIVE
KETONES UR: NEGATIVE mg/dL
NITRITE: NEGATIVE
PH: 7 (ref 5.0–8.0)
PROTEIN: NEGATIVE mg/dL
Specific Gravity, Urine: 1.009 (ref 1.005–1.030)
Urobilinogen, UA: 0.2 mg/dL (ref 0.0–1.0)

## 2014-11-26 LAB — BASIC METABOLIC PANEL
Anion gap: 4 — ABNORMAL LOW (ref 5–15)
BUN: 9 mg/dL (ref 6–23)
CO2: 24 mmol/L (ref 19–32)
Calcium: 9.1 mg/dL (ref 8.4–10.5)
Chloride: 107 mmol/L (ref 96–112)
Creatinine, Ser: 0.67 mg/dL (ref 0.50–1.10)
GFR calc Af Amer: >90 mL/min (ref >90)
GLUCOSE: 108 mg/dL — AB (ref 70–99)
Potassium: 4 mmol/L (ref 3.5–5.1)
SODIUM: 135 mmol/L (ref 135–145)

## 2014-11-26 LAB — PREGNANCY, URINE: Preg Test, Ur: NEGATIVE

## 2014-11-26 LAB — TROPONIN I: Troponin I: <0.03 ng/mL (ref <0.031)

## 2014-11-26 MED ORDER — DIAZEPAM 10 MG PO TABS: 5.0000 mg | ORAL_TABLET | Freq: Four times a day (QID) | ORAL | Status: DC | PRN

## 2014-11-26 MED ORDER — RANITIDINE HCL 150 MG PO CAPS: 150.0000 mg | ORAL_CAPSULE | Freq: Every day | ORAL | Status: DC

## 2014-11-26 MED ORDER — SODIUM CHLORIDE 0.9 % IV BOLUS (SEPSIS)
1000.0000 mL | Freq: Once | INTRAVENOUS | Status: AC
  Administered 2014-11-26: 1000 mL via INTRAVENOUS

## 2014-11-26 MED ORDER — ACETAMINOPHEN 325 MG PO TABS
650.0000 mg | ORAL_TABLET | Freq: Once | ORAL | Status: AC
  Administered 2014-11-26: 650 mg via ORAL
  Filled 2014-11-26: qty 2

## 2014-11-26 MED ORDER — DIAZEPAM 5 MG PO TABS
5.0000 mg | ORAL_TABLET | Freq: Once | ORAL | Status: AC
  Administered 2014-11-26: 5 mg via ORAL
  Filled 2014-11-26: qty 1

## 2014-11-26 NOTE — Discharge Instructions (Signed)
Chest Pain (Nonspecific) It is often hard to give a specific diagnosis for the cause of chest pain. There is always a chance that your pain could be related to something serious, such as a heart attack or a blood clot in the lungs. You need to follow up with your health care provider for further evaluation. CAUSES   Heartburn.  Pneumonia or bronchitis.  Anxiety or stress.  Inflammation around your heart (pericarditis) or lung (pleuritis or pleurisy).  A blood clot in the lung.  A collapsed lung (pneumothorax). It can develop suddenly on its own (spontaneous pneumothorax) or from trauma to the chest.  Shingles infection (herpes zoster virus). The chest wall is composed of bones, muscles, and cartilage. Any of these can be the source of the pain.  The bones can be bruised by injury.  The muscles or cartilage can be strained by coughing or overwork.  The cartilage can be affected by inflammation and become sore (costochondritis). DIAGNOSIS  Lab tests or other studies may be needed to find the cause of your pain. Your health care provider may have you take a test called an ambulatory electrocardiogram (ECG). An ECG records your heartbeat patterns over a 24-hour period. You may also have other tests, such as:  Transthoracic echocardiogram (TTE). During echocardiography, sound waves are used to evaluate how blood flows through your heart.  Transesophageal echocardiogram (TEE).  Cardiac monitoring. This allows your health care provider to monitor your heart rate and rhythm in real time.  Holter monitor. This is a portable device that records your heartbeat and can help diagnose heart arrhythmias. It allows your health care provider to track your heart activity for several days, if needed.  Stress tests by exercise or by giving medicine that makes the heart beat faster. TREATMENT   Treatment depends on what may be causing your chest pain. Treatment may include:  Acid blockers for  heartburn.  Anti-inflammatory medicine.  Pain medicine for inflammatory conditions.  Antibiotics if an infection is present.  You may be advised to change lifestyle habits. This includes stopping smoking and avoiding alcohol, caffeine, and chocolate.  You may be advised to keep your head raised (elevated) when sleeping. This reduces the chance of acid going backward from your stomach into your esophagus. Most of the time, nonspecific chest pain will improve within 2-3 days with rest and mild pain medicine.  HOME CARE INSTRUCTIONS   If antibiotics were prescribed, take them as directed. Finish them even if you start to feel better.  For the next few days, avoid physical activities that bring on chest pain. Continue physical activities as directed.  Do not use any tobacco products, including cigarettes, chewing tobacco, or electronic cigarettes.  Avoid drinking alcohol.  Only take medicine as directed by your health care provider.  Follow your health care provider's suggestions for further testing if your chest pain does not go away.  Keep any follow-up appointments you made. If you do not go to an appointment, you could develop lasting (chronic) problems with pain. If there is any problem keeping an appointment, call to reschedule. SEEK MEDICAL CARE IF:   Your chest pain does not go away, even after treatment.  You have a rash with blisters on your chest.  You have a fever. SEEK IMMEDIATE MEDICAL CARE IF:   You have increased chest pain or pain that spreads to your arm, neck, jaw, back, or abdomen.  You have shortness of breath.  You have an increasing cough, or you cough  up blood.  You have severe back or abdominal pain.  You feel nauseous or vomit.  You have severe weakness.  You faint.  You have chills. This is an emergency. Do not wait to see if the pain will go away. Get medical help at once. Call your local emergency services (911 in [place] Do not drive  yourself to the hospital. MAKE SURE YOU:   Understand these instructions.  Will watch your condition.  Will get help right away if you are not doing well or get worse. Document Released: 07/18/2005 Document Revised: 10/13/2013 Document Reviewed: 05/13/2008 [facility] Patient Information 2015 [place], [place]. This information is not intended to replace advice given to you by your health care provider. Make sure you discuss any questions you have with your health care provider.   Dizziness Dizziness is a common problem. It is a feeling of unsteadiness or light-headedness. You may feel like you are about to faint. Dizziness can lead to injury if you stumble or fall. A person of any age group can suffer from dizziness, but dizziness is more common in older adults. CAUSES  Dizziness can be caused by many different things, including:  Middle ear problems.  Standing for too long.  Infections.  An allergic reaction.  Aging.  An emotional response to something, such as the sight of blood.  Side effects of medicines.  Tiredness.  Problems with circulation or blood pressure.  Excessive use of alcohol or medicines, or illegal drug use.  Breathing too fast (hyperventilation).  An irregular heart rhythm (arrhythmia).  A low red blood cell count (anemia).  Pregnancy.  Vomiting, diarrhea, fever, or other illnesses that cause body fluid loss (dehydration).  Diseases or conditions such as Parkinson's disease, high blood pressure (hypertension), diabetes, and thyroid problems.  Exposure to extreme heat. DIAGNOSIS  Your health care provider will ask about your symptoms, perform a physical exam, and perform an electrocardiogram (ECG) to record the electrical activity of your heart. Your health care provider may also perform other heart or blood tests to determine the cause of your dizziness. These may include:  Transthoracic echocardiogram (TTE). During echocardiography, sound waves are  used to evaluate how blood flows through your heart.  Transesophageal echocardiogram (TEE).  Cardiac monitoring. This allows your health care provider to monitor your heart rate and rhythm in real time.  Holter monitor. This is a portable device that records your heartbeat and can help diagnose heart arrhythmias. It allows your health care provider to track your heart activity for several days if needed.  Stress tests by exercise or by giving medicine that makes the heart beat faster. TREATMENT  Treatment of dizziness depends on the cause of your symptoms and can vary greatly. HOME CARE INSTRUCTIONS   Drink enough fluids to keep your urine clear or pale yellow. This is especially important in very hot weather. In older adults, it is also important in cold weather.  Take your medicine exactly as directed if your dizziness is caused by medicines. When taking blood pressure medicines, it is especially important to get up slowly.  Rise slowly from chairs and steady yourself until you feel okay.  In the morning, first sit up on the side of the bed. When you feel okay, stand slowly while holding onto something until you know your balance is fine.  Move your legs often if you need to stand in one place for a long time. Tighten and relax your muscles in your legs while standing.  Have someone stay  with you for 1-2 days if dizziness continues to be a problem. Do this until you feel you are well enough to stay alone. Have the person call your health care provider if he or she notices changes in you that are concerning.  Do not drive or use heavy machinery if you feel dizzy.  Do not drink alcohol. SEEK IMMEDIATE MEDICAL CARE IF:   Your dizziness or light-headedness gets worse.  You feel nauseous or vomit.  You have problems talking, walking, or using your arms, hands, or legs.  You feel weak.  You are not thinking clearly or you have trouble forming sentences. It may take a friend or  family member to notice this.  You have chest pain, abdominal pain, shortness of breath, or sweating.  Your vision changes.  You notice any bleeding.  You have side effects from medicine that seems to be getting worse rather than better. MAKE SURE YOU:   Understand these instructions.  Will watch your condition.  Will get help right away if you are not doing well or get worse. Document Released: 04/03/2001 Document Revised: 10/13/2013 Document Reviewed: 04/27/2011 [facility] Patient Information 2015 [place], [place]. This information is not intended to replace advice given to you by your health care provider. Make sure you discuss any questions you have with your health care provider.

## 2014-11-26 NOTE — ED Notes (Signed)
\  [id number]\

## 2014-11-26 NOTE — ED Notes (Signed)
MD at bedside. 

## 2014-11-26 NOTE — ED Notes (Signed)
Patient transported to X-ray 

## 2014-11-26 NOTE — ED Notes (Addendum)
Patient states she has a three day history of burning pain in her mid chest with intermittent periods of sharpness.  States this morning she woke up and felt ok, around 0715, she developed dizziness and felt like she was going to pass out.  Has vomited x 3.  Denies diarrhea.  States she has a three year history of dizziness with extensive workup.  Last MRI was one year ago.  Family history of same, with a history of cerebral aneurysm.  Patient states she took an [place], but felt she vomited it back up.  Patient states today is the worse she has ever had.

## 2014-11-26 NOTE — ED Notes (Signed)
A Hulin, RN gave 1000cc NS bolus upon IV start

## 2014-11-26 NOTE — ED Provider Notes (Signed)
CSN: [id number]     Arrival date & time 11/26/14  0917 History   First MD Initiated Contact with Patient 11/26/14 1112     Chief Complaint  Patient presents with  . Dizziness     (Consider location/radiation/quality/duration/timing/severity/associated sxs/prior Treatment) Patient is a 39 y.o. female presenting with dizziness and chest pain. The history is provided by the patient. No language interpreter was used.  Dizziness Quality:  Lightheadedness and room spinning Severity:  Severe Onset quality:  Sudden Duration:  1 day (worse today, but also present daily for 3 years w/o known cause despite large w/u. ) Timing:  Constant Progression:  Waxing and waning Chronicity:  Chronic Context comment:  Spontaneously Relieved by:  Nothing Worsened by:  Nothing tried Ineffective treatments:  Closing eyes, food, fluids, medication, turning head, lying down, being still and change in position Associated symptoms: chest pain   Associated symptoms: no diarrhea, no headaches, no nausea, no palpitations, no shortness of breath and no vomiting   Associated symptoms comment:  L hand and tongue paresthias starting in room  Risk factors: no anemia, no hx of stroke, no hx of vertigo, no Meniere's disease and no new medications   Chest Pain Pain location:  Substernal area Pain quality: burning   Pain radiates to:  Does not radiate Pain radiates to the back: no   Pain severity:  Moderate Onset quality:  Sudden Duration:  3 days Timing:  Intermittent Progression:  Waxing and waning Chronicity:  New Context: at rest   Relieved by:  Nothing Worsened by:  Nothing tried Ineffective treatments: peptobismal. Associated symptoms: dizziness   Associated symptoms: no abdominal pain, no anxiety, no back pain, no cough, no diaphoresis, no fatigue, no fever, no headache, no lower extremity edema, no nausea, no numbness, no palpitations, no shortness of breath, not vomiting and no weakness     Past Medical  History  Diagnosis Date  . Chicken pox as a child  . Overweight(278.02)   . Headache(784.0) 11/22/2011  . Allergic state 11/22/2011  . Visual changes 11/22/2011  . Elevated BP 11/22/2011  . Anxiety 11/22/2011  . Sinusitis 11/22/2011  . Plantar fasciitis 01/11/2012  . Fatigue 01/11/2012  . Pharyngitis 01/11/2012  . Sleep apnea 04/03/2012    setting 2  . Chronic kidney disease     kidney stones  . Hypertension   . Kidney stone 12/21/2012  . Back pain 01/31/2013  . 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 Dehydration 01/31/2013  . Preventative health care 08/18/2013  . BPV (benign positional vertigo) 08/23/2013  . Other and unspecified hyperlipidemia 03/07/2014   Past Surgical History  Procedure Laterality Date  . Gum surgery    . Wisdom tooth extraction    . Perineal body repair      4 th degree tear with fistula between vagina and retum requiring 3 surgeries for correction  . Rectal fistula repair      x 2   Family History  Problem Relation Age of Onset  . Diabetes Mother     type 2  . Hypertension Mother   . Hyperlipidemia Mother   . Hypertension Father   . Hyperlipidemia Father   . Aneurysm Father     abdominal aorta  . Cancer Maternal Grandmother 82    colon, ovarian  . Other Maternal Grandfather 41    brain hemorrhage  . Stroke Maternal Grandfather   . Aneurysm Maternal Grandfather   . Heart attack Paternal Grandmother   . Heart disease Paternal Grandmother  MI  . Alzheimer's disease Paternal Grandfather   . Dementia Paternal Grandfather   . Heart attack Paternal Aunt   . Heart disease Paternal Aunt      AAA rupture  . Other Paternal Uncle     cerebreal brain hemorrhage  . Stroke Paternal Uncle   . Aneurysm Paternal Uncle   . Asthma Daughter   . Other Daughter     peanut allergy  . Heart disease Maternal Aunt     s/p cabg. brain aneurysm s/p coiling  . Aneurysm Maternal Aunt     coil behind eye   History  Substance Use Topics  . Smoking status: Never Smoker    . Smokeless tobacco: Never Used  . Alcohol Use: No   OB History    No data available     Review of Systems  Constitutional: Negative for fever, chills, diaphoresis, activity change, appetite change and fatigue.  HENT: Negative for congestion, facial swelling, rhinorrhea and sore throat.   Eyes: Negative for photophobia and discharge.  Respiratory: Negative for cough, chest tightness and shortness of breath.   Cardiovascular: Positive for chest pain. Negative for palpitations and leg swelling.  Gastrointestinal: Negative for nausea, vomiting, abdominal pain and diarrhea.  Endocrine: Negative for polydipsia and polyuria.  Genitourinary: Negative for dysuria, frequency, difficulty urinating and pelvic pain.  Musculoskeletal: Negative for back pain, arthralgias, neck pain and neck stiffness.  Skin: Negative for color change and wound.  Allergic/Immunologic: Negative for immunocompromised state.  Neurological: Positive for dizziness. Negative for facial asymmetry, weakness, numbness and headaches.  Hematological: Does not bruise/bleed easily.  Psychiatric/Behavioral: Negative for confusion and agitation.      Allergies  Review of patient's allergies indicates no known allergies.  Home Medications   Prior to Admission medications   Medication Sig Start Date End Date Taking? Authorizing Provider  amLODipine (NORVASC) 5 MG tablet Take 1 tablet (5 mg total) by mouth daily. 11/24/13  Yes Mosie Lukes, MD  ibuprofen (ADVIL,MOTRIN) 200 MG tablet Take 400 mg by mouth every 6 (six) hours as needed for mild pain or moderate pain.   Yes Historical Provider, MD  meclizine (ANTIVERT) 25 MG tablet Take 1 tablet (25 mg total) by mouth 3 (three) times daily as needed for dizziness. 09/09/14  Yes Tammi Sou, MD  diazepam (VALIUM) 10 MG tablet Take 0.5-1 tablets (5-10 mg total) by mouth every 6 (six) hours as needed (Dizziness). 11/26/14   Ernestina Patches, MD  ranitidine (ZANTAC) 150 MG capsule  Take 1 capsule (150 mg total) by mouth daily. 11/26/14   Ernestina Patches, MD  scopolamine (TRANSDERM-SCOP) 1 MG/3DAYS Place 1 patch (1.5 mg total) onto the skin every 3 (three) days. 09/09/14   Tammi Sou, MD   BP 125/66 mmHg  Pulse 74  Temp(Src) 97.9 F (36.6 C) (Oral)  Resp 18  Ht 5' 5"  (1.651 m)  Wt 220 lb (99.791 kg)  BMI 36.61 kg/m2  SpO2 99%  LMP 11/03/2014 (Exact Date) Physical Exam  Constitutional: She is oriented to person, place, and time. She appears well-developed and well-nourished. No distress.  HENT:  Head: Normocephalic and atraumatic.  Mouth/Throat: No oropharyngeal exudate.  Eyes: Pupils are equal, round, and reactive to light.  Neck: Normal range of motion. Neck supple.  Cardiovascular: Normal rate, regular rhythm and normal heart sounds.  Exam reveals no gallop and no friction rub.   No murmur heard. Pulmonary/Chest: Effort normal and breath sounds normal. No respiratory distress. She has no wheezes. She has  no rales.  Abdominal: Soft. Bowel sounds are normal. She exhibits no distension and no mass. There is no tenderness. There is no rebound and no guarding.  Musculoskeletal: Normal range of motion. She exhibits no edema or tenderness.  Neurological: She is alert and oriented to person, place, and time. She has normal strength. She displays no atrophy and no tremor. No cranial nerve deficit or sensory deficit. She exhibits normal muscle tone. She displays a negative Romberg sign. Coordination and gait normal. GCS eye subscore is 4. GCS verbal subscore is 5. GCS motor subscore is 6.  Skin: Skin is warm and dry.  Psychiatric: She has a normal mood and affect.    ED Course  Procedures (including critical care time) Labs Review Labs Reviewed  CBC WITH DIFFERENTIAL/PLATELET - Abnormal; Notable for the following:    WBC 13.2 (*)    Neutro Abs 10.2 (*)    All other components within normal limits  BASIC METABOLIC PANEL - Abnormal; Notable for the following:     Glucose, Bld 108 (*)    Anion gap 4 (*)    All other components within normal limits  URINALYSIS, ROUTINE W REFLEX MICROSCOPIC - Abnormal; Notable for the following:    Leukocytes, UA TRACE (*)    All other components within normal limits  URINE MICROSCOPIC-ADD ON - Abnormal; Notable for the following:    Bacteria, UA FEW (*)    All other components within normal limits  PREGNANCY, URINE  TROPONIN I    Imaging Review Dg Chest 2 View  11/26/2014   CLINICAL DATA:  Dizziness for 3 years. Mid chest pain 3 days. Vomiting and dizziness today.  EXAM: CHEST  2 VIEW  COMPARISON:  None.  FINDINGS: The heart size and mediastinal contours are within normal limits. Both lungs are clear. The visualized skeletal structures are unremarkable.  IMPRESSION: No active cardiopulmonary disease.   Electronically Signed   By: Rolm Baptise M.D.   On: 11/26/2014 12:09     EKG Interpretation   Date/Time:  Friday November 26 2014 10:16:31 EST Ventricular Rate:  73 PR Interval:  164 QRS Duration: 82 QT Interval:  388 QTC Calculation: 427 R Axis:   64 Text Interpretation:  Normal sinus rhythm Normal ECG Confirmed by Yashar Inclan   MD, Alexxa Sabet ([id number]) on 11/26/2014 10:43:03 AM     Per office note from Dr. Anitra Lauth on 09/09/14  "Patient is a 39 y.o. Caucasian female who is here for disequilibrium syndrome and HTN. Disequilibrium has been an issue for 3 yrs or so and no one has been able to figure out the etiology.  She started seeing a PT yesterday and the PT was concerned it may be her bp or her bp med that is the problem. She has seen an ENT, has had 3 brain MRI's, ophthalmologist, neurologist, and HA specialist: pt says no one can explain why she feels dizziness. She describes the sensation as "swimmy headed" and it is not vertigo. She says it is constant and is not made worse by anything or better by anything. Onset about 3 yrs ago, seemed to ease up some for a while, then it returned severe again about a year  ago."    CLINICAL DATA: Vision changes. Persistent dizziness and lightheadedness. Tingling in the hands.  EXAM: MRI HEAD WITHOUT CONTRAST  TECHNIQUE: Multiplanar, multiecho pulse sequences of the brain and surrounding structures were obtained without intravenous contrast.  COMPARISON: MRI brain 02/20/2014.  FINDINGS: No acute infarct, hemorrhage, or mass lesion  is present. The ventricles are of normal size. No significant extraaxial fluid collection is present.  Flow is present in the major intracranial arteries. No significant white matter disease is evident. Midline structures are within normal limits. Mild mucosal thickening is present within the ethmoid air cells bilaterally. The mastoid air cells are clear.  IMPRESSION: 1. Normal MRI appearance the brain. 2. Mild ethmoid sinus disease.   Electronically Signed  By: Lawrence Santiago M.D.  On: 09/01/2014 15:38   MDM   Final diagnoses:  Disequilibrium syndrome  Atypical chest pain    Pt is a 39 y.o. female with Pmhx as above who presents with 3 days of intermittent midsternal burning in her chest lasting approximately 30 minutes at a time without associated symptoms as well as dizziness.  Patient states the dizziness is a chronic issue occurring daily for about 3 years.  She is seen multiple specialists including primary doctor, neurologist, ophthalmologist.  ENT has had 3 MRIs and cause of dizziness, has yet to be found.  On PE, VSS, pt in NAD, No focal neuro findings, negative Dix-Halpike. EKG with no acute ischemic changes.  Troponin negative.  Do not feel chest pain is consistent with ACS.  She is also perk negative and I doubt PE.  Giving report of intermittent burning.  This is likely GI in nature.  Given her extensive workup for dizziness in the past.  I do not feel she needs further workup in the emergency department today.  She felt somewhat better after try Valium.  We will discharge her home with  prescription for trial of Valium IV, encouraged her to try to reenroll in physical therapy which has given her some improvement in the past.     Francene Finders evaluation in the Emergency Department is complete. It has been determined that no acute conditions requiring further emergency intervention are present at this time. The patient/guardian have been advised of the diagnosis and plan. We have discussed signs and symptoms that warrant return to the ED, such as changes or worsening in symptoms, worsening chest pain, trouble breathing, fever, leg swelling.      Ernestina Patches, MD 11/27/14 [phone]

## 2014-12-09 ENCOUNTER — Ambulatory Visit (INDEPENDENT_AMBULATORY_CARE_PROVIDER_SITE_OTHER): Payer: BLUE CROSS/BLUE SHIELD | Admitting: Family Medicine

## 2014-12-09 ENCOUNTER — Encounter: Payer: Self-pay | Admitting: Family Medicine

## 2014-12-09 VITALS — BP 134/90 | HR 75 | Temp 98.0°F | Ht 65.0 in | Wt 224.0 lb

## 2014-12-09 DIAGNOSIS — E782 Mixed hyperlipidemia: Secondary | ICD-10-CM

## 2014-12-09 DIAGNOSIS — E663 Overweight: Secondary | ICD-10-CM

## 2014-12-09 DIAGNOSIS — R42 Dizziness and giddiness: Secondary | ICD-10-CM

## 2014-12-09 DIAGNOSIS — Z23 Encounter for immunization: Secondary | ICD-10-CM

## 2014-12-09 DIAGNOSIS — I10 Essential (primary) hypertension: Secondary | ICD-10-CM

## 2014-12-09 DIAGNOSIS — E860 Dehydration: Secondary | ICD-10-CM

## 2014-12-09 DIAGNOSIS — K219 Gastro-esophageal reflux disease without esophagitis: Secondary | ICD-10-CM

## 2014-12-09 DIAGNOSIS — E878 Other disorders of electrolyte and fluid balance, not elsewhere classified: Secondary | ICD-10-CM

## 2014-12-09 DIAGNOSIS — T7840XA Allergy, unspecified, initial encounter: Secondary | ICD-10-CM

## 2014-12-09 LAB — CBC
HEMATOCRIT: 42.8 % (ref 36.0–46.0)
HEMOGLOBIN: 14.6 g/dL (ref 12.0–15.0)
MCHC: 34 g/dL (ref 30.0–36.0)
MCV: 90.8 fl (ref 78.0–100.0)
PLATELETS: 323 10*3/uL (ref 150.0–400.0)
RBC: 4.72 Mil/uL (ref 3.87–5.11)
RDW: 12.5 % (ref 11.5–15.5)
WBC: 11.5 10*3/uL — AB (ref 4.0–10.5)

## 2014-12-09 LAB — COMPREHENSIVE METABOLIC PANEL
ALBUMIN: 3.8 g/dL (ref 3.5–5.2)
ALT: 48 U/L — ABNORMAL HIGH (ref 0–35)
AST: 31 U/L (ref 0–37)
Alkaline Phosphatase: 84 U/L (ref 39–117)
BUN: 10 mg/dL (ref 6–23)
CALCIUM: 9.5 mg/dL (ref 8.4–10.5)
CHLORIDE: 101 meq/L (ref 96–112)
CO2: 30 meq/L (ref 19–32)
CREATININE: 0.73 mg/dL (ref 0.40–1.20)
GFR: 93.71 mL/min (ref >60.00)
GLUCOSE: 93 mg/dL (ref 70–99)
POTASSIUM: 4.2 meq/L (ref 3.5–5.1)
Sodium: 134 mEq/L — ABNORMAL LOW (ref 135–145)
Total Bilirubin: 0.5 mg/dL (ref 0.2–1.2)
Total Protein: 7.7 g/dL (ref 6.0–8.3)

## 2014-12-09 LAB — LIPID PANEL
CHOL/HDL RATIO: 5
Cholesterol: 214 mg/dL — ABNORMAL HIGH (ref 0–200)
HDL: 43.5 mg/dL (ref >39.00)
LDL Cholesterol: 136 mg/dL — ABNORMAL HIGH (ref 0–99)
NonHDL: 170.5
Triglycerides: 171 mg/dL — ABNORMAL HIGH (ref 0.0–149.0)
VLDL: 34.2 mg/dL (ref 0.0–40.0)

## 2014-12-09 LAB — TSH: TSH: 1.78 u[IU]/mL (ref 0.35–4.50)

## 2014-12-09 MED ORDER — RANITIDINE HCL 300 MG PO CAPS: 300.0000 mg | ORAL_CAPSULE | Freq: Every evening | ORAL | Status: DC

## 2014-12-09 NOTE — Patient Instructions (Signed)
Start Zyrtec 10 mg Public relations account executive for Gastroesophageal Reflux Disease When you have gastroesophageal reflux disease (GERD), the foods you eat and your eating habits are very important. Choosing the right foods can help ease the discomfort of GERD. WHAT GENERAL GUIDELINES DO I NEED TO FOLLOW?  Choose fruits, vegetables, whole grains, low-fat dairy products, and low-fat meat, fish, and poultry.  Limit fats such as oils, salad dressings, butter, nuts, and avocado.  Keep a food diary to identify foods that cause symptoms.  Avoid foods that cause reflux. These may be different for different people.  Eat frequent small meals instead of three large meals each day.  Eat your meals slowly, in a relaxed setting.  Limit fried foods.  Cook foods using methods other than frying.  Avoid drinking alcohol.  Avoid drinking large amounts of liquids with your meals.  Avoid bending over or lying down until 2-3 hours after eating. WHAT FOODS ARE NOT RECOMMENDED? The following are some foods and drinks that may worsen your symptoms: Vegetables Tomatoes. Tomato juice. Tomato and spaghetti sauce. Chili peppers. Onion and garlic. Horseradish. Fruits Oranges, grapefruit, and lemon (fruit and juice). Meats High-fat meats, fish, and poultry. This includes hot dogs, ribs, ham, sausage, salami, and bacon. Dairy Whole milk and chocolate milk. Sour cream. Cream. Butter. Ice cream. Cream cheese.  Beverages Coffee and tea, with or without caffeine. Carbonated beverages or energy drinks. Condiments Hot sauce. Barbecue sauce.  Sweets/Desserts Chocolate and cocoa. Donuts. Peppermint and spearmint. Fats and Oils High-fat foods, including [place] fries and potato chips. Other Vinegar. Strong spices, such as black pepper, white pepper, red pepper, cayenne, curry powder, cloves, ginger, and chili powder. The items listed above may not be a complete list of foods and beverages to avoid. Contact your dietitian  for more information. Document Released: 10/08/2005 Document Revised: 10/13/2013 Document Reviewed: 08/12/2013 [facility] Patient Information 2015 [place], [place]. This information is not intended to replace advice given to you by your health care provider. Make sure you discuss any questions you have with your health care provider. ily and Ranitidine 300 mg daily

## 2014-12-09 NOTE — Progress Notes (Signed)
Pre visit review using our clinic review tool, if applicable. No additional management support is needed unless otherwise documented below in the visit note. 

## 2014-12-13 ENCOUNTER — Other Ambulatory Visit: Payer: Self-pay | Admitting: Family Medicine

## 2014-12-16 LAB — IGG FOOD PANEL
ALLERGEN PEANUT IGG: 2.2 ug/mL — AB (ref <0.15)
Beef, IgG: 4.9 ug/mL — ABNORMAL HIGH (ref <2.0)
Chicken, IgG: <0.15 ug/mL (ref <0.15)
Egg white, IgG: 2.5 ug/mL — ABNORMAL HIGH (ref <2.0)
Egg yolk, IgG: 3.1 ug/mL — ABNORMAL HIGH (ref <2.0)
Wheat, IgG: 11.7 ug/mL — ABNORMAL HIGH (ref <0.15)

## 2014-12-20 NOTE — Assessment & Plan Note (Signed)
Encouraged heart healthy diet, increase exercise, avoid trans fats, consider a krill oil cap daily 

## 2014-12-20 NOTE — Progress Notes (Signed)
Erika Weaver  [id number] 1976/09/09 12/20/2014      Progress Note-Follow Up  Subjective  Chief Complaint  Chief Complaint  Patient presents with  . Follow-up    HPI  Patient is a 39 y.o. female in today for routine medical care. Patient is in today for follow-up. Agrees to flu shot. Has been trying to hydrate better and not skip meals but acknowledges she is not always successful. She notes her blood pressures been 120s over 70s routinely. Did have a recent episode of nausea and vomiting 2 during an episode of spinning. This has not been recurrent. She continues to struggle with stress. Is complaining of itchy watery eyes and nasal congestion. Notes that she was struggling with increased dyspepsia and even some chest pain she described as sharp and waking her up. She started Zantac and that was helpful.  Past Medical History  Diagnosis Date  . Chicken pox as a child  . Overweight(278.02)   . Headache(784.0) 11/22/2011  . Allergic state 11/22/2011  . Visual changes 11/22/2011  . Elevated BP 11/22/2011  . Anxiety 11/22/2011  . Sinusitis 11/22/2011  . Plantar fasciitis 01/11/2012  . Fatigue 01/11/2012  . Pharyngitis 01/11/2012  . Sleep apnea 04/03/2012    setting 2  . Chronic kidney disease     kidney stones  . Hypertension   . Kidney stone 12/21/2012  . Back pain 01/31/2013  . 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 Dehydration 01/31/2013  . Preventative health care 08/18/2013  . BPV (benign positional vertigo) 08/23/2013  . Other and unspecified hyperlipidemia 03/07/2014    Past Surgical History  Procedure Laterality Date  . Gum surgery    . Wisdom tooth extraction    . Perineal body repair      4 th degree tear with fistula between vagina and retum requiring 3 surgeries for correction  . Rectal fistula repair      x 2    Family History  Problem Relation Age of Onset  . Diabetes Mother     type 2  . Hypertension Mother   . Hyperlipidemia Mother   . Hypertension Father    . Hyperlipidemia Father   . Aneurysm Father     abdominal aorta  . Cancer Maternal Grandmother 82    colon, ovarian  . Other Maternal Grandfather 41    brain hemorrhage  . Stroke Maternal Grandfather   . Aneurysm Maternal Grandfather   . Heart attack Paternal Grandmother   . Heart disease Paternal Grandmother     MI  . Alzheimer's disease Paternal Grandfather   . Dementia Paternal Grandfather   . Heart attack Paternal Aunt   . Heart disease Paternal Aunt      AAA rupture  . Other Paternal Uncle     cerebreal brain hemorrhage  . Stroke Paternal Uncle   . Aneurysm Paternal Uncle   . Asthma Daughter   . Other Daughter     peanut allergy  . Heart disease Maternal Aunt     s/p cabg. brain aneurysm s/p coiling  . Aneurysm Maternal Aunt     coil behind eye    History   Social History  . Marital Status: Married    Spouse Name: N/A  . Number of Children: N/A  . Years of Education: N/A   Occupational History  . Not on file.   Social History Main Topics  . Smoking status: Never Smoker   . Smokeless tobacco: Never Used  . Alcohol Use: No  .  Drug Use: No  . Sexual Activity:    Partners: Male    Birth Control/ Protection: None     Comment: lives with husband, daughter, no dietary restrictions   Other Topics Concern  . Not on file   Social History Narrative    Current Outpatient Prescriptions on File Prior to Visit  Medication Sig Dispense Refill  . diazepam (VALIUM) 10 MG tablet Take 0.5-1 tablets (5-10 mg total) by mouth every 6 (six) hours as needed (Dizziness). (Patient not taking: Reported on 12/09/2014) 20 tablet 0  . ibuprofen (ADVIL,MOTRIN) 200 MG tablet Take 400 mg by mouth every 6 (six) hours as needed for mild pain or moderate pain.     No current facility-administered medications on file prior to visit.    No Known Allergies  Review of Systems  Review of Systems  Constitutional: Positive for malaise/fatigue. Negative for fever.  HENT: Positive for  congestion.   Eyes: Negative for discharge.  Respiratory: Negative for shortness of breath.   Cardiovascular: Negative for chest pain, palpitations and leg swelling.  Gastrointestinal: Positive for nausea and vomiting. Negative for abdominal pain and diarrhea.  Genitourinary: Negative for dysuria.  Musculoskeletal: Negative for falls.  Skin: Negative for rash.  Neurological: Positive for dizziness. Negative for sensory change, speech change, focal weakness, seizures, loss of consciousness and headaches.  Endo/Heme/Allergies: Negative for polydipsia.  Psychiatric/Behavioral: Negative for depression and suicidal ideas. The patient is not nervous/anxious and does not have insomnia.     Objective  BP 134/90 mmHg  Pulse 75  Temp(Src) 98 F (36.7 C) (Oral)  Ht 5' 5"  (1.651 m)  Wt 224 lb (101.606 kg)  BMI 37.28 kg/m2  SpO2 100%  LMP 11/03/2014 (Exact Date)  Physical Exam  Physical Exam  Constitutional: She is oriented to person, place, and time and well-developed, well-nourished, and in no distress. No distress.  HENT:  Head: Normocephalic and atraumatic.  Right Ear: External ear normal.  Left Ear: External ear normal.  Nose: Nose normal.  Mouth/Throat: Oropharynx is clear and moist. No oropharyngeal exudate.  Eyes: Conjunctivae are normal. Pupils are equal, round, and reactive to light. Right eye exhibits no discharge. Left eye exhibits no discharge. No scleral icterus.  Neck: Normal range of motion. Neck supple. No thyromegaly present.  Cardiovascular: Normal rate, regular rhythm, normal heart sounds and intact distal pulses.   No murmur heard. Pulmonary/Chest: Effort normal and breath sounds normal. No respiratory distress. She has no wheezes. She has no rales.  Abdominal: Soft. Bowel sounds are normal. She exhibits no distension and no mass. There is no tenderness.  Musculoskeletal: Normal range of motion. She exhibits no edema or tenderness.  Lymphadenopathy:    She has no  cervical adenopathy.  Neurological: She is alert and oriented to person, place, and time. She has normal reflexes. No cranial nerve deficit. Coordination normal.  Skin: Skin is warm and dry. No rash noted. She is not diaphoretic.  Psychiatric: Mood, memory and affect normal.    Lab Results  Component Value Date   TSH 1.78 12/09/2014   Lab Results  Component Value Date   WBC 11.5* 12/09/2014   HGB 14.6 12/09/2014   HCT 42.8 12/09/2014   MCV 90.8 12/09/2014   PLT 323.0 12/09/2014   Lab Results  Component Value Date   CREATININE 0.73 12/09/2014   BUN 10 12/09/2014   NA 134* 12/09/2014   K 4.2 12/09/2014   CL 101 12/09/2014   CO2 30 12/09/2014   Lab Results  Component Value Date   ALT 48* 12/09/2014   AST 31 12/09/2014   ALKPHOS 84 12/09/2014   BILITOT 0.5 12/09/2014   Lab Results  Component Value Date   CHOL 214* 12/09/2014   Lab Results  Component Value Date   HDL 43.50 12/09/2014   Lab Results  Component Value Date   LDLCALC 136* 12/09/2014   Lab Results  Component Value Date   TRIG 171.0* 12/09/2014   Lab Results  Component Value Date   CHOLHDL 5 12/09/2014     Assessment & Plan  Essential hypertension Mildly elevated but improved with recheck. Encouraged heart healthy diet such as the DASH diet and exercise as tolerated.    Hyperlipidemia, mixed Encouraged heart healthy diet, increase exercise, avoid trans fats, consider a krill oil cap daily   Overweight Encouraged DASH diet, decrease po intake and increase exercise as tolerated. Needs 7-8 hours of sleep nightly. Avoid trans fats, eat small, frequent meals every 4-5 hours with lean proteins, complex carbs and healthy fats. Minimize simple carbs   Dehydration Hydrating better but still not adequate at times. Reminded 64 oz of clear fluids daily   Disequilibrium syndrome Continues to struggle intermittently, has an appt with allergist soon. Encouraged to proceed with consultation. So far work  up has been unremarkable has seen neurology, gastroenterology, ENT

## 2014-12-20 NOTE — Assessment & Plan Note (Signed)
Encouraged DASH diet, decrease po intake and increase exercise as tolerated. Needs 7-8 hours of sleep nightly. Avoid trans fats, eat small, frequent meals every 4-5 hours with lean proteins, complex carbs and healthy fats. Minimize simple carbs 

## 2014-12-20 NOTE — Assessment & Plan Note (Signed)
Continues to struggle intermittently, has an appt with allergist soon. Encouraged to proceed with consultation. So far work up has been unremarkable has seen neurology, gastroenterology, ENT

## 2014-12-20 NOTE — Assessment & Plan Note (Signed)
Hydrating better but still not adequate at times. Reminded 64 oz of clear fluids daily

## 2014-12-20 NOTE — Assessment & Plan Note (Signed)
Mildly elevated but improved with recheck. Encouraged heart healthy diet such as the DASH diet and exercise as tolerated.

## 2015-02-24 ENCOUNTER — Other Ambulatory Visit: Payer: Self-pay

## 2015-02-24 DIAGNOSIS — Z1231 Encounter for screening mammogram for malignant neoplasm of breast: Secondary | ICD-10-CM

## 2015-03-10 ENCOUNTER — Encounter: Payer: Self-pay | Admitting: Family Medicine

## 2015-03-10 ENCOUNTER — Ambulatory Visit (INDEPENDENT_AMBULATORY_CARE_PROVIDER_SITE_OTHER): Payer: BLUE CROSS/BLUE SHIELD | Admitting: Family Medicine

## 2015-03-10 VITALS — BP 132/88 | HR 78 | Temp 98.3°F | Ht 65.0 in | Wt 221.2 lb

## 2015-03-10 DIAGNOSIS — E782 Mixed hyperlipidemia: Secondary | ICD-10-CM | POA: Diagnosis not present

## 2015-03-10 DIAGNOSIS — I10 Essential (primary) hypertension: Secondary | ICD-10-CM | POA: Diagnosis not present

## 2015-03-10 DIAGNOSIS — E663 Overweight: Secondary | ICD-10-CM | POA: Diagnosis not present

## 2015-03-10 DIAGNOSIS — R51 Headache: Secondary | ICD-10-CM | POA: Diagnosis not present

## 2015-03-10 DIAGNOSIS — Z0000 Encounter for general adult medical examination without abnormal findings: Secondary | ICD-10-CM

## 2015-03-10 DIAGNOSIS — R519 Headache, unspecified: Secondary | ICD-10-CM

## 2015-03-10 LAB — TSH: TSH: 1.29 u[IU]/mL (ref 0.35–4.50)

## 2015-03-10 LAB — COMPREHENSIVE METABOLIC PANEL
ALK PHOS: 77 U/L (ref 39–117)
ALT: 20 U/L (ref 0–35)
AST: 17 U/L (ref 0–37)
Albumin: 3.9 g/dL (ref 3.5–5.2)
BUN: 9 mg/dL (ref 6–23)
CHLORIDE: 102 meq/L (ref 96–112)
CO2: 28 meq/L (ref 19–32)
CREATININE: 0.72 mg/dL (ref 0.40–1.20)
Calcium: 9.6 mg/dL (ref 8.4–10.5)
GFR: 95.1 mL/min (ref >60.00)
GLUCOSE: 90 mg/dL (ref 70–99)
Potassium: 3.6 mEq/L (ref 3.5–5.1)
SODIUM: 134 meq/L — AB (ref 135–145)
Total Bilirubin: 0.6 mg/dL (ref 0.2–1.2)
Total Protein: 7.7 g/dL (ref 6.0–8.3)

## 2015-03-10 LAB — CBC
HCT: 41.4 % (ref 36.0–46.0)
Hemoglobin: 14.4 g/dL (ref 12.0–15.0)
MCHC: 34.8 g/dL (ref 30.0–36.0)
MCV: 89.1 fl (ref 78.0–100.0)
Platelets: 298 10*3/uL (ref 150.0–400.0)
RBC: 4.65 Mil/uL (ref 3.87–5.11)
RDW: 12.4 % (ref 11.5–15.5)
WBC: 12.1 10*3/uL — ABNORMAL HIGH (ref 4.0–10.5)

## 2015-03-10 LAB — LIPID PANEL
CHOLESTEROL: 184 mg/dL (ref 0–200)
HDL: 40.6 mg/dL (ref >39.00)
LDL Cholesterol: 118 mg/dL — ABNORMAL HIGH (ref 0–99)
NONHDL: 143.4
Total CHOL/HDL Ratio: 5
Triglycerides: 129 mg/dL (ref 0.0–149.0)
VLDL: 25.8 mg/dL (ref 0.0–40.0)

## 2015-03-10 LAB — HIV ANTIBODY (ROUTINE TESTING W REFLEX): HIV: NONREACTIVE

## 2015-03-10 MED ORDER — AMLODIPINE BESYLATE 5 MG PO TABS: 5.0000 mg | ORAL_TABLET | Freq: Every day | ORAL | Status: DC

## 2015-03-10 NOTE — Assessment & Plan Note (Signed)
Encouraged heart healthy diet, increase exercise, avoid trans fats, consider a krill oil cap daily 

## 2015-03-10 NOTE — Assessment & Plan Note (Signed)
Encouraged increased hydration, 64 ounces of clear fluids daily. Minimize alcohol and caffeine. Eat small frequent meals with lean proteins and complex carbs. Avoid high and low blood sugars. Get adequate sleep, 7-8 hours a night. Needs exercise daily preferably in the morning.

## 2015-03-10 NOTE — Assessment & Plan Note (Signed)
Well controlled, no changes to meds. Encouraged heart healthy diet such as the DASH diet and exercise as tolerated.  °

## 2015-03-10 NOTE — Assessment & Plan Note (Signed)
Encouraged DASH diet, decrease po intake and increase exercise as tolerated. Needs 7-8 hours of sleep nightly. Avoid trans fats, eat small, frequent meals every 4-5 hours with lean proteins, complex carbs and healthy fats. Minimize simple carbs, GMO foods. 

## 2015-03-10 NOTE — Progress Notes (Signed)
Erika Weaver  [id number] 1976/07/01 03/10/2015      Progress Note-Follow Up  Subjective  Chief Complaint  Chief Complaint  Patient presents with  . Follow-up    3 month    HPI  Patient is a 39 y.o. female in today for routine medical care. Patient is in today for follow-up. Overall is somewhat improved but has continued to have symptoms off and on since her last visit of congestion intermittent headache and lightheadedness. No fevers or chills. No migrainous symptoms such as photophobia and phonophobia. She technologist some level of anxiety secondary to her symptoms. No anhedonia. Has mammogram ordered for next month. No recent illness or other acute complaints noted. Denies CP/palp/SOB/fevers/GI or GU c/o. Taking meds as prescribed   Past Medical History  Diagnosis Date  . Chicken pox as a child  . Overweight(278.02)   . Headache(784.0) 11/22/2011  . Allergic state 11/22/2011  . Visual changes 11/22/2011  . Elevated BP 11/22/2011  . Anxiety 11/22/2011  . Sinusitis 11/22/2011  . Plantar fasciitis 01/11/2012  . Fatigue 01/11/2012  . Pharyngitis 01/11/2012  . Sleep apnea 04/03/2012    setting 2  . Chronic kidney disease     kidney stones  . Hypertension   . Kidney stone 12/21/2012  . Back pain 01/31/2013  . 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 Dehydration 01/31/2013  . Preventative health care 08/18/2013  . BPV (benign positional vertigo) 08/23/2013  . Other and unspecified hyperlipidemia 03/07/2014    Past Surgical History  Procedure Laterality Date  . Gum surgery    . Wisdom tooth extraction    . Perineal body repair      4 th degree tear with fistula between vagina and retum requiring 3 surgeries for correction  . Rectal fistula repair      x 2    Family History  Problem Relation Age of Onset  . Diabetes Mother     type 2  . Hypertension Mother   . Hyperlipidemia Mother   . Hypertension Father   . Hyperlipidemia Father   . Aneurysm Father     abdominal aorta    . Cancer Maternal Grandmother 82    colon, ovarian  . Other Maternal Grandfather 41    brain hemorrhage  . Stroke Maternal Grandfather   . Aneurysm Maternal Grandfather   . Heart attack Paternal Grandmother   . Heart disease Paternal Grandmother     MI  . Alzheimer's disease Paternal Grandfather   . Dementia Paternal Grandfather   . Heart attack Paternal Aunt   . Heart disease Paternal Aunt      AAA rupture  . Other Paternal Uncle     cerebreal brain hemorrhage  . Stroke Paternal Uncle   . Aneurysm Paternal Uncle   . Asthma Daughter   . Other Daughter     peanut allergy  . Heart disease Maternal Aunt     s/p cabg. brain aneurysm s/p coiling  . Aneurysm Maternal Aunt     coil behind eye    History   Social History  . Marital Status: Married    Spouse Name: N/A  . Number of Children: N/A  . Years of Education: N/A   Occupational History  . Not on file.   Social History Main Topics  . Smoking status: Never Smoker   . Smokeless tobacco: Never Used  . Alcohol Use: No  . Drug Use: No  . Sexual Activity:    Partners: Male    Birth  Control/ Protection: None     Comment: lives with husband, daughter, no dietary restrictions   Other Topics Concern  . Not on file   Social History Narrative    Current Outpatient Prescriptions on File Prior to Visit  Medication Sig Dispense Refill  . ibuprofen (ADVIL,MOTRIN) 200 MG tablet Take 400 mg by mouth every 6 (six) hours as needed for mild pain or moderate pain.    . ranitidine (ZANTAC) 300 MG capsule Take 1 capsule (300 mg total) by mouth every evening. 30 capsule 3   No current facility-administered medications on file prior to visit.    No Known Allergies  Review of Systems  Review of Systems  Constitutional: Positive for malaise/fatigue. Negative for fever.  HENT: Positive for congestion.   Eyes: Negative for discharge.  Respiratory: Negative for shortness of breath.   Cardiovascular: Negative for chest pain,  palpitations and leg swelling.  Gastrointestinal: Negative for nausea, abdominal pain and diarrhea.  Genitourinary: Negative for dysuria.  Musculoskeletal: Negative for falls.  Skin: Negative for rash.  Neurological: Positive for dizziness and headaches. Negative for loss of consciousness.  Endo/Heme/Allergies: Negative for polydipsia.  Psychiatric/Behavioral: Negative for depression and suicidal ideas. The patient is not nervous/anxious and does not have insomnia.     Objective  BP 132/88 mmHg  Pulse 78  Temp(Src) 98.3 F (36.8 C) (Oral)  Ht 5' 5"  (1.651 m)  Wt 221 lb 4 oz (100.358 kg)  BMI 36.82 kg/m2  LMP 02/08/2015  Physical Exam  Physical Exam  Constitutional: She is oriented to person, place, and time and well-developed, well-nourished, and in no distress. No distress.  HENT:  Head: Normocephalic and atraumatic.  Eyes: Conjunctivae are normal.  Neck: Neck supple. No thyromegaly present.  Cardiovascular: Normal rate, regular rhythm and normal heart sounds.   No murmur heard. Pulmonary/Chest: Effort normal and breath sounds normal. She has no wheezes.  Abdominal: She exhibits no distension and no mass.  Musculoskeletal: She exhibits no edema.  Lymphadenopathy:    She has no cervical adenopathy.  Neurological: She is alert and oriented to person, place, and time.  Skin: Skin is warm and dry. No rash noted. She is not diaphoretic.  Psychiatric: Memory, affect and judgment normal.    Lab Results  Component Value Date   TSH 1.78 12/09/2014   Lab Results  Component Value Date   WBC 11.5* 12/09/2014   HGB 14.6 12/09/2014   HCT 42.8 12/09/2014   MCV 90.8 12/09/2014   PLT 323.0 12/09/2014   Lab Results  Component Value Date   CREATININE 0.73 12/09/2014   BUN 10 12/09/2014   NA 134* 12/09/2014   K 4.2 12/09/2014   CL 101 12/09/2014   CO2 30 12/09/2014   Lab Results  Component Value Date   ALT 48* 12/09/2014   AST 31 12/09/2014   ALKPHOS 84 12/09/2014    BILITOT 0.5 12/09/2014   Lab Results  Component Value Date   CHOL 214* 12/09/2014   Lab Results  Component Value Date   HDL 43.50 12/09/2014   Lab Results  Component Value Date   LDLCALC 136* 12/09/2014   Lab Results  Component Value Date   TRIG 171.0* 12/09/2014   Lab Results  Component Value Date   CHOLHDL 5 12/09/2014     Assessment & Plan   Essential hypertension Well controlled, no changes to meds. Encouraged heart healthy diet such as the DASH diet and exercise as tolerated.    Headache Encouraged increased hydration, 64 ounces  of clear fluids daily. Minimize alcohol and caffeine. Eat small frequent meals with lean proteins and complex carbs. Avoid high and low blood sugars. Get adequate sleep, 7-8 hours a night. Needs exercise daily preferably in the morning.    Hyperlipidemia, mixed Encouraged heart healthy diet, increase exercise, avoid trans fats, consider a krill oil cap daily   Overweight Encouraged DASH diet, decrease po intake and increase exercise as tolerated. Needs 7-8 hours of sleep nightly. Avoid trans fats, eat small, frequent meals every 4-5 hours with lean proteins, complex carbs and healthy fats. Minimize simple carbs, GMO foods.

## 2015-03-10 NOTE — Patient Instructions (Signed)
Basic Carbohydrate Counting  Carbohydrate counting is a method for keeping track of the amount of carbohydrates you eat. Eating carbohydrates naturally increases the level of sugar (glucose) in your blood, so it is important for you to know the amount that is okay for you to have in every meal. Carbohydrate counting helps keep the level of glucose in your blood within normal limits. The amount of carbohydrates allowed is different for every person. A dietitian can help you calculate the amount that is right for you. Once you know the amount of carbohydrates you can have, you can count the carbohydrates in the foods you want to eat. Carbohydrates are found in the following foods:  Grains, such as breads and cereals.  Dried beans and soy products.  Starchy vegetables, such as potatoes, peas, and corn.  Fruit and fruit juices.  Milk and yogurt.  Sweets and snack foods, such as cake, cookies, candy, chips, soft drinks, and fruit drinks. CARBOHYDRATE COUNTING There are two ways to count the carbohydrates in your food. You can use either of the methods or a combination of both. Reading the "Nutrition Facts" on [place] The "Nutrition Facts" is an area that is included on the labels of almost all packaged food and beverages in the [place]. It includes the serving size of that food or beverage and information about the nutrients in each serving of the food, including the grams (g) of carbohydrate per serving.  Decide the number of servings of this food or beverage that you will be able to eat or drink. Multiply that number of servings by the number of grams of carbohydrate that is listed on the label for that serving. The total will be the amount of carbohydrates you will be having when you eat or drink this food or beverage. Learning Standard Serving Sizes of Food When you eat food that is not packaged or does not include "Nutrition Facts" on the label, you need to measure the servings in  order to count the amount of carbohydrates.A serving of most carbohydrate-rich foods contains about 15 g of carbohydrates. The following list includes serving sizes of carbohydrate-rich foods that provide 15 g ofcarbohydrate per serving:   1 slice of bread (1 oz) or 1 six-inch tortilla.    of a hamburger bun or English muffin.  4-6 crackers.   cup unsweetened dry cereal.    cup hot cereal.   cup rice or pasta.    cup mashed potatoes or  of a large baked potato.  1 cup fresh fruit or one small piece of fruit.    cup canned or frozen fruit or fruit juice.  1 cup milk.   cup plain fat-free yogurt or yogurt sweetened with artificial sweeteners.   cup cooked dried beans or starchy vegetable, such as peas, corn, or potatoes.  Decide the number of standard-size servings that you will eat. Multiply that number of servings by 15 (the grams of carbohydrates in that serving). For example, if you eat 2 cups of strawberries, you will have eaten 2 servings and 30 g of carbohydrates (2 servings x 15 g = 30 g). For foods such as soups and casseroles, in which more than one food is mixed in, you will need to count the carbohydrates in each food that is included. EXAMPLE OF CARBOHYDRATE COUNTING Sample Dinner  3 oz chicken breast.   cup of brown rice.   cup of corn.  1 cup milk.   1 cup strawberries with sugar-free whipped  topping.  Carbohydrate Calculation Step 1: Identify the foods that contain carbohydrates:   Rice.   Corn.   Milk.   Strawberries. Step 2:Calculate the number of servings eaten of each:   2 servings of rice.   1 serving of corn.   1 serving of milk.   1 serving of strawberries. Step 3: Multiply each of those number of servings by 15 g:   2 servings of rice x 15 g = 30 g.   1 serving of corn x 15 g = 15 g.   1 serving of milk x 15 g = 15 g.   1 serving of strawberries x 15 g = 15 g. Step 4: Add together all of the amounts  to find the total grams of carbohydrates eaten: 30 g + 15 g + 15 g + 15 g = 75 g. Document Released: 10/08/2005 Document Revised: 02/22/2014 Document Reviewed: 09/04/2013 [facility] Patient Information 2015 [place], [place]. This information is not intended to replace advice given to you by your health care provider. Make sure you discuss any questions you have with your health care provider.

## 2015-03-18 ENCOUNTER — Telehealth: Payer: Self-pay | Admitting: Family Medicine

## 2015-03-18 MED ORDER — METHYLPREDNISOLONE 4 MG PO TBPK: ORAL_TABLET | ORAL | Status: DC

## 2015-03-18 NOTE — Telephone Encounter (Signed)
Sometimes a short course of steroids can treat vertigo, can try a medrol dosepak, 4 mg tabs 6 tabs po once, then 5 tabs po qd x 1 d then 4 tabs po qd x 1 d, then 3 tabs po qd x 1 d then 2 tabs po qd x 1 d then 1 tab po qd x 1 day, disp #21

## 2015-03-18 NOTE — Telephone Encounter (Signed)
Caller name: Suzannah Relation to pt: self Call back number: [phone] Pharmacy: [facility] in [place]  Reason for call:   Patient states that she has been dizzy for the past four days. Would like to switch to another medication from meclizine. Is wanting to know if this could be changed today?

## 2015-03-18 NOTE — Telephone Encounter (Signed)
Sent in [place] called the patient to inform prescription sent in.

## 2015-03-24 ENCOUNTER — Telehealth: Payer: Self-pay | Admitting: Family Medicine

## 2015-03-24 NOTE — Telephone Encounter (Signed)
She can come back in here and/or would say back to neurology for further consideration since this is worse.

## 2015-03-24 NOTE — Telephone Encounter (Signed)
Caller name: Relationship to patient: Can be reached: [phone] Pharmacy: [facility] in [place]  Reason for call: Pt states that she completed the steriod that was prescribed for dizziness but is still experiencing dizziness. States it has been really bad for the past 2 weeks. She is asking what is recommended as a next step.

## 2015-03-24 NOTE — Telephone Encounter (Signed)
Patient informed PCP instructions and will call back and schedule with PCP to discuss.

## 2015-04-04 ENCOUNTER — Ambulatory Visit: Payer: BLUE CROSS/BLUE SHIELD

## 2015-04-28 ENCOUNTER — Ambulatory Visit
Admission: RE | Admit: 2015-04-28 | Discharge: 2015-04-28 | Disposition: A | Discharge status: Home / Self Care | Payer: BLUE CROSS/BLUE SHIELD | Source: Ambulatory Visit

## 2015-04-28 DIAGNOSIS — Z1231 Encounter for screening mammogram for malignant neoplasm of breast: Secondary | ICD-10-CM

## 2015-05-21 LAB — HM MAMMOGRAPHY: HM Mammogram: NORMAL

## 2015-09-21 ENCOUNTER — Telehealth: Payer: Self-pay

## 2015-09-21 LAB — HM PAP SMEAR: HM PAP: NORMAL

## 2015-09-22 ENCOUNTER — Ambulatory Visit (INDEPENDENT_AMBULATORY_CARE_PROVIDER_SITE_OTHER): Payer: Managed Care, Other (non HMO) | Admitting: Family Medicine

## 2015-09-22 ENCOUNTER — Encounter: Payer: Self-pay | Admitting: Family Medicine

## 2015-09-22 VITALS — BP 118/80 | HR 73 | Temp 98.6°F | Ht 65.5 in | Wt 223.2 lb

## 2015-09-22 DIAGNOSIS — E871 Hypo-osmolality and hyponatremia: Secondary | ICD-10-CM

## 2015-09-22 DIAGNOSIS — H8110 Benign paroxysmal vertigo, unspecified ear: Secondary | ICD-10-CM

## 2015-09-22 DIAGNOSIS — I10 Essential (primary) hypertension: Secondary | ICD-10-CM | POA: Diagnosis not present

## 2015-09-22 DIAGNOSIS — Z0000 Encounter for general adult medical examination without abnormal findings: Secondary | ICD-10-CM

## 2015-09-22 DIAGNOSIS — E782 Mixed hyperlipidemia: Secondary | ICD-10-CM

## 2015-09-22 DIAGNOSIS — Z23 Encounter for immunization: Secondary | ICD-10-CM

## 2015-09-22 DIAGNOSIS — E663 Overweight: Secondary | ICD-10-CM | POA: Diagnosis not present

## 2015-09-22 LAB — COMPREHENSIVE METABOLIC PANEL
ALBUMIN: 3.8 g/dL (ref 3.5–5.2)
ALT: 23 U/L (ref 0–35)
AST: 19 U/L (ref 0–37)
Alkaline Phosphatase: 87 U/L (ref 39–117)
BILIRUBIN TOTAL: 0.7 mg/dL (ref 0.2–1.2)
BUN: 10 mg/dL (ref 6–23)
CALCIUM: 9.5 mg/dL (ref 8.4–10.5)
CHLORIDE: 102 meq/L (ref 96–112)
CO2: 27 meq/L (ref 19–32)
CREATININE: 0.7 mg/dL (ref 0.40–1.20)
GFR: 97.98 mL/min (ref >60.00)
Glucose, Bld: 74 mg/dL (ref 70–99)
Potassium: 3.5 mEq/L (ref 3.5–5.1)
SODIUM: 137 meq/L (ref 135–145)
Total Protein: 7.8 g/dL (ref 6.0–8.3)

## 2015-09-22 LAB — CBC
HEMATOCRIT: 42.7 % (ref 36.0–46.0)
Hemoglobin: 14.4 g/dL (ref 12.0–15.0)
MCHC: 33.6 g/dL (ref 30.0–36.0)
MCV: 91.2 fl (ref 78.0–100.0)
PLATELETS: 306 10*3/uL (ref 150.0–400.0)
RBC: 4.68 Mil/uL (ref 3.87–5.11)
RDW: 12.8 % (ref 11.5–15.5)
WBC: 12.1 10*3/uL — AB (ref 4.0–10.5)

## 2015-09-22 LAB — LIPID PANEL
CHOLESTEROL: 199 mg/dL (ref 0–200)
HDL: 37.7 mg/dL — ABNORMAL LOW (ref >39.00)
LDL CALC: 139 mg/dL — AB (ref 0–99)
NonHDL: 160.8
TRIGLYCERIDES: 109 mg/dL (ref 0.0–149.0)
Total CHOL/HDL Ratio: 5
VLDL: 21.8 mg/dL (ref 0.0–40.0)

## 2015-09-22 LAB — TSH: TSH: 3.23 u[IU]/mL (ref 0.35–4.50)

## 2015-09-22 NOTE — Patient Instructions (Signed)
Preventive Care for Adults, Female A healthy lifestyle and preventive care can promote health and wellness. Preventive health guidelines for women include the following key practices.  A routine yearly physical is a good way to check with your health care provider about your health and preventive screening. It is a chance to share any concerns and updates on your health and to receive a thorough exam.  Visit your dentist for a routine exam and preventive care every 6 months. Brush your teeth twice a day and floss once a day. Good oral hygiene prevents tooth decay and gum disease.  The frequency of eye exams is based on your age, health, family medical history, use of contact lenses, and other factors. Follow your health care provider's recommendations for frequency of eye exams.  Eat a healthy diet. Foods like vegetables, fruits, whole grains, low-fat dairy products, and lean protein foods contain the nutrients you need without too many calories. Decrease your intake of foods high in solid fats, added sugars, and salt. Eat the right amount of calories for you.Get information about a proper diet from your health care provider, if necessary.  Regular physical exercise is one of the most important things you can do for your health. Most adults should get at least 150 minutes of moderate-intensity exercise (any activity that increases your heart rate and causes you to sweat) each week. In addition, most adults need muscle-strengthening exercises on 2 or more days a week.  Maintain a healthy weight. The body mass index (BMI) is a screening tool to identify possible weight problems. It provides an estimate of body fat based on height and weight. Your health care provider can find your BMI and can help you achieve or maintain a healthy weight.For adults 20 years and older:  A BMI below 18.5 is considered underweight.  A BMI of 18.5 to 24.9 is normal.  A BMI of 25 to 29.9 is considered overweight.  A  BMI of 30 and above is considered obese.  Maintain normal blood lipids and cholesterol levels by exercising and minimizing your intake of saturated fat. Eat a balanced diet with plenty of fruit and vegetables. Blood tests for lipids and cholesterol should begin at age 45 and be repeated every 5 years. If your lipid or cholesterol levels are high, you are over 50, or you are at high risk for heart disease, you may need your cholesterol levels checked more frequently.Ongoing high lipid and cholesterol levels should be treated with medicines if diet and exercise are not working.  If you smoke, find out from your health care provider how to quit. If you do not use tobacco, do not start.  Lung cancer screening is recommended for adults aged 45-80 years who are at high risk for developing lung cancer because of a history of smoking. A yearly low-dose CT scan of the lungs is recommended for people who have at least a 30-pack-year history of smoking and are a current smoker or have quit within the past 15 years. A pack year of smoking is smoking an average of 1 pack of cigarettes a day for 1 year (for example: 1 pack a day for 30 years or 2 packs a day for 15 years). Yearly screening should continue until the smoker has stopped smoking for at least 15 years. Yearly screening should be stopped for people who develop a health problem that would prevent them from having lung cancer treatment.  If you are pregnant, do not drink alcohol. If you are  breastfeeding, be very cautious about drinking alcohol. If you are not pregnant and choose to drink alcohol, do not have more than 1 drink per day. One drink is considered to be 12 ounces (355 mL) of beer, 5 ounces (148 mL) of wine, or 1.5 ounces (44 mL) of liquor.  Avoid use of street drugs. Do not share needles with anyone. Ask for help if you need support or instructions about stopping the use of drugs.  High blood pressure causes heart disease and increases the risk  of stroke. Your blood pressure should be checked at least every 1 to 2 years. Ongoing high blood pressure should be treated with medicines if weight loss and exercise do not work.  If you are 55-79 years old, ask your health care provider if you should take aspirin to prevent strokes.  Diabetes screening is done by taking a blood sample to check your blood glucose level after you have not eaten for a certain period of time (fasting). If you are not overweight and you do not have risk factors for diabetes, you should be screened once every 3 years starting at age 45. If you are overweight or obese and you are 40-70 years of age, you should be screened for diabetes every year as part of your cardiovascular risk assessment.  Breast cancer screening is essential preventive care for women. You should practice "breast self-awareness." This means understanding the normal appearance and feel of your breasts and may include breast self-examination. Any changes detected, no matter how small, should be reported to a health care provider. Women in their 20s and 30s should have a clinical breast exam (CBE) by a health care provider as part of a regular health exam every 1 to 3 years. After age 40, women should have a CBE every year. Starting at age 40, women should consider having a mammogram (breast X-ray test) every year. Women who have a family history of breast cancer should talk to their health care provider about genetic screening. Women at a high risk of breast cancer should talk to their health care providers about having an MRI and a mammogram every year.  Breast cancer gene (BRCA)-related cancer risk assessment is recommended for women who have family members with BRCA-related cancers. BRCA-related cancers include breast, ovarian, tubal, and peritoneal cancers. Having family members with these cancers may be associated with an increased risk for harmful changes (mutations) in the breast cancer genes BRCA1 and  BRCA2. Results of the assessment will determine the need for genetic counseling and BRCA1 and BRCA2 testing.  Your health care provider may recommend that you be screened regularly for cancer of the pelvic organs (ovaries, uterus, and vagina). This screening involves a pelvic examination, including checking for microscopic changes to the surface of your cervix (Pap test). You may be encouraged to have this screening done every 3 years, beginning at age 21.  For women ages 30-65, health care providers may recommend pelvic exams and Pap testing every 3 years, or they may recommend the Pap and pelvic exam, combined with testing for human papilloma virus (HPV), every 5 years. Some types of HPV increase your risk of cervical cancer. Testing for HPV may also be done on women of any age with unclear Pap test results.  Other health care providers may not recommend any screening for nonpregnant women who are considered low risk for pelvic cancer and who do not have symptoms. Ask your health care provider if a screening pelvic exam is right for   you.  If you have had past treatment for cervical cancer or a condition that could lead to cancer, you need Pap tests and screening for cancer for at least 20 years after your treatment. If Pap tests have been discontinued, your risk factors (such as having a new sexual partner) need to be reassessed to determine if screening should resume. Some women have medical problems that increase the chance of getting cervical cancer. In these cases, your health care provider may recommend more frequent screening and Pap tests.  Colorectal cancer can be detected and often prevented. Most routine colorectal cancer screening begins at the age of 50 years and continues through age 75 years. However, your health care provider may recommend screening at an earlier age if you have risk factors for colon cancer. On a yearly basis, your health care provider may provide home test kits to check  for hidden blood in the stool. Use of a small camera at the end of a tube, to directly examine the colon (sigmoidoscopy or colonoscopy), can detect the earliest forms of colorectal cancer. Talk to your health care provider about this at age 50, when routine screening begins. Direct exam of the colon should be repeated every 5-10 years through age 75 years, unless early forms of precancerous polyps or small growths are found.  People who are at an increased risk for hepatitis B should be screened for this virus. You are considered at high risk for hepatitis B if:  You were born in a country where hepatitis B occurs often. Talk with your health care provider about which countries are considered high risk.  Your parents were born in a high-risk country and you have not received a shot to protect against hepatitis B (hepatitis B vaccine).  You have HIV or AIDS.  You use needles to inject street drugs.  You live with, or have sex with, someone who has hepatitis B.  You get hemodialysis treatment.  You take certain medicines for conditions like cancer, organ transplantation, and autoimmune conditions.  Hepatitis C blood testing is recommended for all people born from 1945 through 1965 and any individual with known risks for hepatitis C.  Practice safe sex. Use condoms and avoid high-risk sexual practices to reduce the spread of sexually transmitted infections (STIs). STIs include gonorrhea, chlamydia, syphilis, trichomonas, herpes, HPV, and human immunodeficiency virus (HIV). Herpes, HIV, and HPV are viral illnesses that have no cure. They can result in disability, cancer, and death.  You should be screened for sexually transmitted illnesses (STIs) including gonorrhea and chlamydia if:  You are sexually active and are younger than 24 years.  You are older than 24 years and your health care provider tells you that you are at risk for this type of infection.  Your sexual activity has changed  since you were last screened and you are at an increased risk for chlamydia or gonorrhea. Ask your health care provider if you are at risk.  If you are at risk of being infected with HIV, it is recommended that you take a prescription medicine daily to prevent HIV infection. This is called preexposure prophylaxis (PrEP). You are considered at risk if:  You are sexually active and do not regularly use condoms or know the HIV status of your partner(s).  You take drugs by injection.  You are sexually active with a partner who has HIV.  Talk with your health care provider about whether you are at high risk of being infected with HIV. If   you choose to begin PrEP, you should first be tested for HIV. You should then be tested every 3 months for as long as you are taking PrEP.  Osteoporosis is a disease in which the bones lose minerals and strength with aging. This can result in serious bone fractures or breaks. The risk of osteoporosis can be identified using a bone density scan. Women ages 67 years and over and women at risk for fractures or osteoporosis should discuss screening with their health care providers. Ask your health care provider whether you should take a calcium supplement or vitamin D to reduce the rate of osteoporosis.  Menopause can be associated with physical symptoms and risks. Hormone replacement therapy is available to decrease symptoms and risks. You should talk to your health care provider about whether hormone replacement therapy is right for you.  Use sunscreen. Apply sunscreen liberally and repeatedly throughout the day. You should seek shade when your shadow is shorter than you. Protect yourself by wearing long sleeves, pants, a wide-brimmed hat, and sunglasses year round, whenever you are outdoors.  Once a month, do a whole body skin exam, using a mirror to look at the skin on your back. Tell your health care provider of new moles, moles that have irregular borders, moles that  are larger than a pencil eraser, or moles that have changed in shape or color.  Stay current with required vaccines (immunizations).  Influenza vaccine. All adults should be immunized every year.  Tetanus, diphtheria, and acellular pertussis (Td, Tdap) vaccine. Pregnant women should receive 1 dose of Tdap vaccine during each pregnancy. The dose should be obtained regardless of the length of time since the last dose. Immunization is preferred during the 27th-36th week of gestation. An adult who has not previously received Tdap or who does not know her vaccine status should receive 1 dose of Tdap. This initial dose should be followed by tetanus and diphtheria toxoids (Td) booster doses every 10 years. Adults with an unknown or incomplete history of completing a 3-dose immunization series with Td-containing vaccines should begin or complete a primary immunization series including a Tdap dose. Adults should receive a Td booster every 10 years.  Varicella vaccine. An adult without evidence of immunity to varicella should receive 2 doses or a second dose if she has previously received 1 dose. Pregnant females who do not have evidence of immunity should receive the first dose after pregnancy. This first dose should be obtained before leaving the health care facility. The second dose should be obtained 4-8 weeks after the first dose.  Human papillomavirus (HPV) vaccine. Females aged 13-26 years who have not received the vaccine previously should obtain the 3-dose series. The vaccine is not recommended for use in pregnant females. However, pregnancy testing is not needed before receiving a dose. If a female is found to be pregnant after receiving a dose, no treatment is needed. In that case, the remaining doses should be delayed until after the pregnancy. Immunization is recommended for any person with an immunocompromised condition through the age of 61 years if she did not get any or all doses earlier. During the  3-dose series, the second dose should be obtained 4-8 weeks after the first dose. The third dose should be obtained 24 weeks after the first dose and 16 weeks after the second dose.  Zoster vaccine. One dose is recommended for adults aged 30 years or older unless certain conditions are present.  Measles, mumps, and rubella (MMR) vaccine. Adults born  before 1957 generally are considered immune to measles and mumps. Adults born in 1957 or later should have 1 or more doses of MMR vaccine unless there is a contraindication to the vaccine or there is laboratory evidence of immunity to each of the three diseases. A routine second dose of MMR vaccine should be obtained at least 28 days after the first dose for students attending postsecondary schools, health care workers, or international travelers. People who received inactivated measles vaccine or an unknown type of measles vaccine during 1963-1967 should receive 2 doses of MMR vaccine. People who received inactivated mumps vaccine or an unknown type of mumps vaccine before 1979 and are at high risk for mumps infection should consider immunization with 2 doses of MMR vaccine. For females of childbearing age, rubella immunity should be determined. If there is no evidence of immunity, females who are not pregnant should be vaccinated. If there is no evidence of immunity, females who are pregnant should delay immunization until after pregnancy. Unvaccinated health care workers born before 1957 who lack laboratory evidence of measles, mumps, or rubella immunity or laboratory confirmation of disease should consider measles and mumps immunization with 2 doses of MMR vaccine or rubella immunization with 1 dose of MMR vaccine.  Pneumococcal 13-valent conjugate (PCV13) vaccine. When indicated, a person who is uncertain of his immunization history and has no record of immunization should receive the PCV13 vaccine. All adults 65 years of age and older should receive this  vaccine. An adult aged 19 years or older who has certain medical conditions and has not been previously immunized should receive 1 dose of PCV13 vaccine. This PCV13 should be followed with a dose of pneumococcal polysaccharide (PPSV23) vaccine. Adults who are at high risk for pneumococcal disease should obtain the PPSV23 vaccine at least 8 weeks after the dose of PCV13 vaccine. Adults older than 41 years of age who have normal immune system function should obtain the PPSV23 vaccine dose at least 1 year after the dose of PCV13 vaccine.  Pneumococcal polysaccharide (PPSV23) vaccine. When PCV13 is also indicated, PCV13 should be obtained first. All adults aged 65 years and older should be immunized. An adult younger than age 65 years who has certain medical conditions should be immunized. Any person who resides in a nursing home or long-term care facility should be immunized. An adult smoker should be immunized. People with an immunocompromised condition and certain other conditions should receive both PCV13 and PPSV23 vaccines. People with human immunodeficiency virus (HIV) infection should be immunized as soon as possible after diagnosis. Immunization during chemotherapy or radiation therapy should be avoided. Routine use of PPSV23 vaccine is not recommended for American Indians, [place] Natives, or people younger than 65 years unless there are medical conditions that require PPSV23 vaccine. When indicated, people who have unknown immunization and have no record of immunization should receive PPSV23 vaccine. One-time revaccination 5 years after the first dose of PPSV23 is recommended for people aged 19-64 years who have chronic kidney failure, nephrotic syndrome, asplenia, or immunocompromised conditions. People who received 1-2 doses of PPSV23 before age 65 years should receive another dose of PPSV23 vaccine at age 65 years or later if at least 5 years have passed since the previous dose. Doses of PPSV23 are not  needed for people immunized with PPSV23 at or after age 65 years.  Meningococcal vaccine. Adults with asplenia or persistent complement component deficiencies should receive 2 doses of quadrivalent meningococcal conjugate (MenACWY-D) vaccine. The doses should be obtained   at least 2 months apart. Microbiologists working with certain meningococcal bacteria, [place] recruits, people at risk during an outbreak, and people who travel to or live in countries with a high rate of meningitis should be immunized. A first-year college student up through age 34 years who is living in a residence hall should receive a dose if she did not receive a dose on or after her 16th birthday. Adults who have certain high-risk conditions should receive one or more doses of vaccine.  Hepatitis A vaccine. Adults who wish to be protected from this disease, have certain high-risk conditions, work with hepatitis A-infected animals, work in hepatitis A research labs, or travel to or work in countries with a high rate of hepatitis A should be immunized. Adults who were previously unvaccinated and who anticipate close contact with an international adoptee during the first 60 days after arrival in the [place] from a country with a high rate of hepatitis A should be immunized.  Hepatitis B vaccine. Adults who wish to be protected from this disease, have certain high-risk conditions, may be exposed to blood or other infectious body fluids, are household contacts or sex partners of hepatitis B positive people, are clients or workers in certain care facilities, or travel to or work in countries with a high rate of hepatitis B should be immunized.  Haemophilus influenzae type b (Hib) vaccine. A previously unvaccinated person with asplenia or sickle cell disease or having a scheduled splenectomy should receive 1 dose of Hib vaccine. Regardless of previous immunization, a recipient of a hematopoietic stem cell transplant should receive a  3-dose series 6-12 months after her successful transplant. Hib vaccine is not recommended for adults with HIV infection. Preventive Services / Frequency Ages 35 to 4 years  Blood pressure check.** / Every 3-5 years.  Lipid and cholesterol check.** / Every 5 years beginning at age 60.  Clinical breast exam.** / Every 3 years for women in their 71s and 10s.  BRCA-related cancer risk assessment.** / For women who have family members with a BRCA-related cancer (breast, ovarian, tubal, or peritoneal cancers).  Pap test.** / Every 2 years from ages 76 through 26. Every 3 years starting at age 61 through age 76 or 93 with a history of 3 consecutive normal Pap tests.  HPV screening.** / Every 3 years from ages 37 through ages 60 to 51 with a history of 3 consecutive normal Pap tests.  Hepatitis C blood test.** / For any individual with known risks for hepatitis C.  Skin self-exam. / Monthly.  Influenza vaccine. / Every year.  Tetanus, diphtheria, and acellular pertussis (Tdap, Td) vaccine.** / Consult your health care provider. Pregnant women should receive 1 dose of Tdap vaccine during each pregnancy. 1 dose of Td every 10 years.  Varicella vaccine.** / Consult your health care provider. Pregnant females who do not have evidence of immunity should receive the first dose after pregnancy.  HPV vaccine. / 3 doses over 6 months, if 93 and younger. The vaccine is not recommended for use in pregnant females. However, pregnancy testing is not needed before receiving a dose.  Measles, mumps, rubella (MMR) vaccine.** / You need at least 1 dose of MMR if you were born in 1957 or later. You may also need a 2nd dose. For females of childbearing age, rubella immunity should be determined. If there is no evidence of immunity, females who are not pregnant should be vaccinated. If there is no evidence of immunity, females who are  pregnant should delay immunization until after pregnancy.  Pneumococcal  13-valent conjugate (PCV13) vaccine.** / Consult your health care provider.  Pneumococcal polysaccharide (PPSV23) vaccine.** / 1 to 2 doses if you smoke cigarettes or if you have certain conditions.  Meningococcal vaccine.** / 1 dose if you are age 68 to 8 years and a Market researcher living in a residence hall, or have one of several medical conditions, you need to get vaccinated against meningococcal disease. You may also need additional booster doses.  Hepatitis A vaccine.** / Consult your health care provider.  Hepatitis B vaccine.** / Consult your health care provider.  Haemophilus influenzae type b (Hib) vaccine.** / Consult your health care provider. Ages 7 to 53 years  Blood pressure check.** / Every year.  Lipid and cholesterol check.** / Every 5 years beginning at age 25 years.  Lung cancer screening. / Every year if you are aged 11-80 years and have a 30-pack-year history of smoking and currently smoke or have quit within the past 15 years. Yearly screening is stopped once you have quit smoking for at least 15 years or develop a health problem that would prevent you from having lung cancer treatment.  Clinical breast exam.** / Every year after age 48 years.  BRCA-related cancer risk assessment.** / For women who have family members with a BRCA-related cancer (breast, ovarian, tubal, or peritoneal cancers).  Mammogram.** / Every year beginning at age 41 years and continuing for as long as you are in good health. Consult with your health care provider.  Pap test.** / Every 3 years starting at age 65 years through age 37 or 70 years with a history of 3 consecutive normal Pap tests.  HPV screening.** / Every 3 years from ages 72 years through ages 60 to 40 years with a history of 3 consecutive normal Pap tests.  Fecal occult blood test (FOBT) of stool. / Every year beginning at age 21 years and continuing until age 5 years. You may not need to do this test if you get  a colonoscopy every 10 years.  Flexible sigmoidoscopy or colonoscopy.** / Every 5 years for a flexible sigmoidoscopy or every 10 years for a colonoscopy beginning at age 35 years and continuing until age 48 years.  Hepatitis C blood test.** / For all people born from [birth year] through 1965 and any individual with known risks for hepatitis C.  Skin self-exam. / Monthly.  Influenza vaccine. / Every year.  Tetanus, diphtheria, and acellular pertussis (Tdap/Td) vaccine.** / Consult your health care provider. Pregnant women should receive 1 dose of Tdap vaccine during each pregnancy. 1 dose of Td every 10 years.  Varicella vaccine.** / Consult your health care provider. Pregnant females who do not have evidence of immunity should receive the first dose after pregnancy.  Zoster vaccine.** / 1 dose for adults aged 30 years or older.  Measles, mumps, rubella (MMR) vaccine.** / You need at least 1 dose of MMR if you were born in 1957 or later. You may also need a second dose. For females of childbearing age, rubella immunity should be determined. If there is no evidence of immunity, females who are not pregnant should be vaccinated. If there is no evidence of immunity, females who are pregnant should delay immunization until after pregnancy.  Pneumococcal 13-valent conjugate (PCV13) vaccine.** / Consult your health care provider.  Pneumococcal polysaccharide (PPSV23) vaccine.** / 1 to 2 doses if you smoke cigarettes or if you have certain conditions.  Meningococcal vaccine.** /  Consult your health care provider.  Hepatitis A vaccine.** / Consult your health care provider.  Hepatitis B vaccine.** / Consult your health care provider.  Haemophilus influenzae type b (Hib) vaccine.** / Consult your health care provider. Ages 64 years and over  Blood pressure check.** / Every year.  Lipid and cholesterol check.** / Every 5 years beginning at age 23 years.  Lung cancer screening. / Every year if you  are aged 16-80 years and have a 30-pack-year history of smoking and currently smoke or have quit within the past 15 years. Yearly screening is stopped once you have quit smoking for at least 15 years or develop a health problem that would prevent you from having lung cancer treatment.  Clinical breast exam.** / Every year after age 74 years.  BRCA-related cancer risk assessment.** / For women who have family members with a BRCA-related cancer (breast, ovarian, tubal, or peritoneal cancers).  Mammogram.** / Every year beginning at age 44 years and continuing for as long as you are in good health. Consult with your health care provider.  Pap test.** / Every 3 years starting at age 58 years through age 22 or 39 years with 3 consecutive normal Pap tests. Testing can be stopped between 65 and 70 years with 3 consecutive normal Pap tests and no abnormal Pap or HPV tests in the past 10 years.  HPV screening.** / Every 3 years from ages 64 years through ages 70 or 61 years with a history of 3 consecutive normal Pap tests. Testing can be stopped between 65 and 70 years with 3 consecutive normal Pap tests and no abnormal Pap or HPV tests in the past 10 years.  Fecal occult blood test (FOBT) of stool. / Every year beginning at age 40 years and continuing until age 27 years. You may not need to do this test if you get a colonoscopy every 10 years.  Flexible sigmoidoscopy or colonoscopy.** / Every 5 years for a flexible sigmoidoscopy or every 10 years for a colonoscopy beginning at age 7 years and continuing until age 32 years.  Hepatitis C blood test.** / For all people born from [birth year] through 1967 and any individual with known risks for hepatitis C.  Osteoporosis screening.** / A one-time screening for women ages 30 years and over and women at risk for fractures or osteoporosis.  Skin self-exam. / Monthly.  Influenza vaccine. / Every year.  Tetanus, diphtheria, and acellular pertussis (Tdap/Td)  vaccine.** / 1 dose of Td every 10 years.  Varicella vaccine.** / Consult your health care provider.  Zoster vaccine.** / 1 dose for adults aged 35 years or older.  Pneumococcal 13-valent conjugate (PCV13) vaccine.** / Consult your health care provider.  Pneumococcal polysaccharide (PPSV23) vaccine.** / 1 dose for all adults aged 46 years and older.  Meningococcal vaccine.** / Consult your health care provider.  Hepatitis A vaccine.** / Consult your health care provider.  Hepatitis B vaccine.** / Consult your health care provider.  Haemophilus influenzae type b (Hib) vaccine.** / Consult your health care provider. ** Family history and personal history of risk and conditions may change your health care provider's recommendations.   This information is not intended to replace advice given to you by your health care provider. Make sure you discuss any questions you have with your health care provider.   Document Released: 12/04/2001 Document Revised: 10/29/2014 Document Reviewed: 03/05/2011 Elsevier Interactive Patient Education [employer].

## 2015-09-22 NOTE — Assessment & Plan Note (Signed)
Encouraged DASH diet, decrease po intake and increase exercise as tolerated. Needs 7-8 hours of sleep nightly. Avoid trans fats, eat small, frequent meals every 4-5 hours with lean proteins, complex carbs and healthy fats. Minimize simple carbs 

## 2015-09-22 NOTE — Progress Notes (Signed)
Pre visit review using our clinic review tool, if applicable. No additional management support is needed unless otherwise documented below in the visit note. 

## 2015-09-22 NOTE — Assessment & Plan Note (Signed)
Well controlled, no changes to meds. Encouraged heart healthy diet such as the DASH diet and exercise as tolerated.  °

## 2015-09-23 ENCOUNTER — Other Ambulatory Visit: Payer: Self-pay | Admitting: Family Medicine

## 2015-09-23 DIAGNOSIS — D72829 Elevated white blood cell count, unspecified: Secondary | ICD-10-CM

## 2015-09-23 NOTE — Telephone Encounter (Signed)
CBC ordered for first of March 2017

## 2015-09-25 ENCOUNTER — Encounter: Payer: Self-pay | Admitting: Family Medicine

## 2015-09-25 NOTE — Assessment & Plan Note (Signed)
Symptoms resolved at this time. Maintain adequate hydration.

## 2015-09-25 NOTE — Progress Notes (Signed)
Subjective:    Patient ID: Erika Weaver, female    DOB: Feb 01, 1976, 39 y.o.   MRN: [id number]  Chief Complaint  Patient presents with  . Annual Exam    HPI Patient is in today for annual exam. At this point she is feeling much better. She's had no recent vertigo. She denies any recent illness, fevers or acute concerns. She had her Pap smear back in July with GYN which was normal. No new or acute complaints. Denies CP/palp/SOB/HA/congestion/fevers/GI or GU c/o. Taking meds as prescribed  Past Medical History  Diagnosis Date  . Chicken pox as a child  . Overweight(278.02)   . Headache(784.0) 11/22/2011  . Allergic state 11/22/2011  . Visual changes 11/22/2011  . Elevated BP 11/22/2011  . Anxiety 11/22/2011  . Sinusitis 11/22/2011  . Plantar fasciitis 01/11/2012  . Fatigue 01/11/2012  . Pharyngitis 01/11/2012  . Sleep apnea 04/03/2012    setting 2  . Chronic kidney disease     kidney stones  . Hypertension   . Kidney stone 12/21/2012  . Back pain 01/31/2013  . 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 Dehydration 01/31/2013  . Preventative health care 08/18/2013  . BPV (benign positional vertigo) 08/23/2013  . Other and unspecified hyperlipidemia 03/07/2014    Past Surgical History  Procedure Laterality Date  . Gum surgery    . Wisdom tooth extraction    . Perineal body repair      4 th degree tear with fistula between vagina and retum requiring 3 surgeries for correction  . Rectal fistula repair      x 2    Family History  Problem Relation Age of Onset  . Diabetes Mother     type 2  . Hypertension Mother   . Hyperlipidemia Mother   . Hypertension Father   . Hyperlipidemia Father   . Aneurysm Father     abdominal aorta  . Cancer Maternal Grandmother 82    colon, ovarian  . Other Maternal Grandfather 41    brain hemorrhage  . Stroke Maternal Grandfather   . Aneurysm Maternal Grandfather   . Heart attack Paternal Grandmother   . Heart disease Paternal Grandmother     MI  . Alzheimer's disease Paternal Grandfather   . Dementia Paternal Grandfather   . Heart attack Paternal Aunt   . Heart disease Paternal Aunt      AAA rupture  . Other Paternal Uncle     cerebreal brain hemorrhage  . Stroke Paternal Uncle   . Aneurysm Paternal Uncle   . Asthma Daughter   . Other Daughter     peanut allergy  . Heart disease Maternal Aunt     s/p cabg. brain aneurysm s/p coiling  . Aneurysm Maternal Aunt     coil behind eye    Social History   Social History  . Marital Status: Married    Spouse Name: N/A  . Number of Children: N/A  . Years of Education: N/A   Occupational History  . Not on file.   Social History Main Topics  . Smoking status: Never Smoker   . Smokeless tobacco: Never Used  . Alcohol Use: No  . Drug Use: No  . Sexual Activity:    Partners: Male    Birth Control/ Protection: None     Comment: lives with husband, daughter, no dietary restrictions   Other Topics Concern  . Not on file   Social History Narrative    Outpatient Prescriptions Prior  to Visit  Medication Sig Dispense Refill  . amLODipine (NORVASC) 5 MG tablet Take 1 tablet (5 mg total) by mouth daily. 90 tablet 3  . ibuprofen (ADVIL,MOTRIN) 200 MG tablet Take 400 mg by mouth every 6 (six) hours as needed for mild pain or moderate pain.    . ranitidine (ZANTAC) 300 MG capsule Take 1 capsule (300 mg total) by mouth every evening. (Patient not taking: Reported on 09/21/2015) 30 capsule 3   No facility-administered medications prior to visit.    No Known Allergies  Review of Systems  Constitutional: Negative for fever, chills and malaise/fatigue.  HENT: Negative for congestion and hearing loss.   Eyes: Negative for discharge.  Respiratory: Negative for cough, sputum production and shortness of breath.   Cardiovascular: Negative for chest pain, palpitations and leg swelling.  Gastrointestinal: Negative for heartburn, nausea, vomiting, abdominal pain, diarrhea,  constipation and blood in stool.  Genitourinary: Negative for dysuria, urgency, frequency and hematuria.  Musculoskeletal: Negative for myalgias, back pain and falls.  Skin: Negative for rash.  Neurological: Negative for dizziness, sensory change, loss of consciousness, weakness and headaches.  Endo/Heme/Allergies: Negative for environmental allergies. Does not bruise/bleed easily.  Psychiatric/Behavioral: Negative for depression and suicidal ideas. The patient is not nervous/anxious and does not have insomnia.        Objective:    Physical Exam  Constitutional: She is oriented to person, place, and time. She appears well-developed and well-nourished. No distress.  HENT:  Head: Normocephalic and atraumatic.  Eyes: Conjunctivae are normal.  Neck: Neck supple. No thyromegaly present.  Cardiovascular: Normal rate, regular rhythm and normal heart sounds.   No murmur heard. Pulmonary/Chest: Effort normal and breath sounds normal. No respiratory distress.  Abdominal: Soft. Bowel sounds are normal. She exhibits no distension and no mass. There is no tenderness.  Musculoskeletal: She exhibits no edema.  Lymphadenopathy:    She has no cervical adenopathy.  Neurological: She is alert and oriented to person, place, and time.  Skin: Skin is warm and dry.  Psychiatric: She has a normal mood and affect. Her behavior is normal.    BP 118/80 mmHg  Pulse 73  Temp(Src) 98.6 F (37 C) (Oral)  Ht 5' 5.5" (1.664 m)  Wt 223 lb 4 oz (101.266 kg)  BMI 36.57 kg/m2  SpO2 97% Wt Readings from Last 3 Encounters:  09/22/15 223 lb 4 oz (101.266 kg)  03/10/15 221 lb 4 oz (100.358 kg)  12/09/14 224 lb (101.606 kg)     Lab Results  Component Value Date   WBC 12.1* 09/22/2015   HGB 14.4 09/22/2015   HCT 42.7 09/22/2015   PLT 306.0 09/22/2015   GLUCOSE 74 09/22/2015   CHOL 199 09/22/2015   TRIG 109.0 09/22/2015   HDL 37.70* 09/22/2015   LDLCALC 139* 09/22/2015   ALT 23 09/22/2015   AST 19  09/22/2015   NA 137 09/22/2015   K 3.5 09/22/2015   CL 102 09/22/2015   CREATININE 0.70 09/22/2015   BUN 10 09/22/2015   CO2 27 09/22/2015   TSH 3.23 09/22/2015    Lab Results  Component Value Date   TSH 3.23 09/22/2015   Lab Results  Component Value Date   WBC 12.1* 09/22/2015   HGB 14.4 09/22/2015   HCT 42.7 09/22/2015   MCV 91.2 09/22/2015   PLT 306.0 09/22/2015   Lab Results  Component Value Date   NA 137 09/22/2015   K 3.5 09/22/2015   CO2 27 09/22/2015   GLUCOSE 74  09/22/2015   BUN 10 09/22/2015   CREATININE 0.70 09/22/2015   BILITOT 0.7 09/22/2015   ALKPHOS 87 09/22/2015   AST 19 09/22/2015   ALT 23 09/22/2015   PROT 7.8 09/22/2015   ALBUMIN 3.8 09/22/2015   CALCIUM 9.5 09/22/2015   ANIONGAP 4* 11/26/2014   GFR 97.98 09/22/2015   Lab Results  Component Value Date   CHOL 199 09/22/2015   Lab Results  Component Value Date   HDL 37.70* 09/22/2015   Lab Results  Component Value Date   LDLCALC 139* 09/22/2015   Lab Results  Component Value Date   TRIG 109.0 09/22/2015   Lab Results  Component Value Date   CHOLHDL 5 09/22/2015   No results found for: HGBA1C     Assessment & Plan:   Problem List Items Addressed This Visit    BPV (benign positional vertigo)    Symptoms resolved at this time. Maintain adequate hydration.       Essential hypertension    Well controlled, no changes to meds. Encouraged heart healthy diet such as the DASH diet and exercise as tolerated.       Relevant Orders   TSH (Completed)   CBC (Completed)   Lipid panel (Completed)   Comprehensive metabolic panel (Completed)   Hyperlipidemia, mixed   Relevant Orders   TSH (Completed)   CBC (Completed)   Lipid panel (Completed)   Comprehensive metabolic panel (Completed)   Overweight    Encouraged DASH diet, decrease po intake and increase exercise as tolerated. Needs 7-8 hours of sleep nightly. Avoid trans fats, eat small, frequent meals every 4-5 hours with lean  proteins, complex carbs and healthy fats. Minimize simple carbs      Relevant Orders   TSH (Completed)   CBC (Completed)   Lipid panel (Completed)   Comprehensive metabolic panel (Completed)   Preventative health care    Avoid offending foods, start probiotics. Do not eat large meals in late evening and consider raising head of bed. Labs reviewed. Given and reviewed copy of ACP documents from [employer] and encouraged to complete and return. Follows with [facility] for colonoscopy and Dr Matthew Saras of GYN for paps.        Other Visit Diagnoses    Encounter for immunization    -  Primary    Hyponatremia        Relevant Orders    TSH (Completed)    CBC (Completed)    Lipid panel (Completed)    Comprehensive metabolic panel (Completed)       I have discontinued Ms. Rotondo's ibuprofen and ranitidine. I am also having her maintain her amLODipine.  No orders of the defined types were placed in this encounter.     Penni Homans, MD

## 2015-09-25 NOTE — Assessment & Plan Note (Signed)
Avoid offending foods, start probiotics. Do not eat large meals in late evening and consider raising head of bed. Labs reviewed. Given and reviewed copy of ACP documents from [employer] and encouraged to complete and return. Follows with [facility] for colonoscopy and Dr Matthew Saras of GYN for paps.

## 2015-10-21 ENCOUNTER — Ambulatory Visit (INDEPENDENT_AMBULATORY_CARE_PROVIDER_SITE_OTHER): Payer: Managed Care, Other (non HMO) | Admitting: Family Medicine

## 2015-10-21 ENCOUNTER — Encounter: Payer: Self-pay | Admitting: Family Medicine

## 2015-10-21 VITALS — BP 125/82 | HR 72 | Temp 98.2°F | Resp 20 | Wt 227.0 lb

## 2015-10-21 DIAGNOSIS — J0100 Acute maxillary sinusitis, unspecified: Secondary | ICD-10-CM | POA: Diagnosis not present

## 2015-10-21 MED ORDER — AMOXICILLIN-POT CLAVULANATE 875-125 MG PO TABS: 1.0000 | ORAL_TABLET | Freq: Two times a day (BID) | ORAL | Status: DC

## 2015-10-21 MED ORDER — FLUTICASONE PROPIONATE 50 MCG/ACT NA SUSP: 2.0000 | Freq: Every day | NASAL | Status: DC

## 2015-10-21 NOTE — Patient Instructions (Signed)
- Flonase start, nasal saline 3x a day. Use regular mucinex. - start using an humidifier nightly.  - I have called in Augmentin for you to start.  Sinusitis, Adult Sinusitis is redness, soreness, and inflammation of the paranasal sinuses. Paranasal sinuses are air pockets within the bones of your face. They are located beneath your eyes, in the middle of your forehead, and above your eyes. In healthy paranasal sinuses, mucus is able to drain out, and air is able to circulate through them by way of your nose. However, when your paranasal sinuses are inflamed, mucus and air can become trapped. This can allow bacteria and other germs to grow and cause infection. Sinusitis can develop quickly and last only a short time (acute) or continue over a long period (chronic). Sinusitis that lasts for more than 12 weeks is considered chronic. CAUSES Causes of sinusitis include:  Allergies.  Structural abnormalities, such as displacement of the cartilage that separates your nostrils (deviated septum), which can decrease the air flow through your nose and sinuses and affect sinus drainage.  Functional abnormalities, such as when the small hairs (cilia) that line your sinuses and help remove mucus do not work properly or are not present. SIGNS AND SYMPTOMS Symptoms of acute and chronic sinusitis are the same. The primary symptoms are pain and pressure around the affected sinuses. Other symptoms include:  Upper toothache.  Earache.  Headache.  Bad breath.  Decreased sense of smell and taste.  A cough, which worsens when you are lying flat.  Fatigue.  Fever.  Thick drainage from your nose, which often is green and may contain pus (purulent).  Swelling and warmth over the affected sinuses. DIAGNOSIS Your health care provider will perform a physical exam. During your exam, your health care provider may perform any of the following to help determine if you have acute sinusitis or chronic  sinusitis:  Look in your nose for signs of abnormal growths in your nostrils (nasal polyps).  Tap over the affected sinus to check for signs of infection.  View the inside of your sinuses using an imaging device that has a light attached (endoscope). If your health care provider suspects that you have chronic sinusitis, one or more of the following tests may be recommended:  Allergy tests.  Nasal culture. A sample of mucus is taken from your nose, sent to a lab, and screened for bacteria.  Nasal cytology. A sample of mucus is taken from your nose and examined by your health care provider to determine if your sinusitis is related to an allergy. TREATMENT Most cases of acute sinusitis are related to a viral infection and will resolve on their own within 10 days. Sometimes, medicines are prescribed to help relieve symptoms of both acute and chronic sinusitis. These may include pain medicines, decongestants, nasal steroid sprays, or saline sprays. However, for sinusitis related to a bacterial infection, your health care provider will prescribe antibiotic medicines. These are medicines that will help kill the bacteria causing the infection. Rarely, sinusitis is caused by a fungal infection. In these cases, your health care provider will prescribe antifungal medicine. For some cases of chronic sinusitis, surgery is needed. Generally, these are cases in which sinusitis recurs more than 3 times per year, despite other treatments. HOME CARE INSTRUCTIONS  Drink plenty of water. Water helps thin the mucus so your sinuses can drain more easily.  Use a humidifier.  Inhale steam 3-4 times a day (for example, sit in the bathroom with the shower  running).  Apply a warm, moist washcloth to your face 3-4 times a day, or as directed by your health care provider.  Use saline nasal sprays to help moisten and clean your sinuses.  Take medicines only as directed by your health care provider.  If you were  prescribed either an antibiotic or antifungal medicine, finish it all even if you start to feel better. SEEK IMMEDIATE MEDICAL CARE IF:  You have increasing pain or severe headaches.  You have nausea, vomiting, or drowsiness.  You have swelling around your face.  You have vision problems.  You have a stiff neck.  You have difficulty breathing.   This information is not intended to replace advice given to you by your health care provider. Make sure you discuss any questions you have with your health care provider.   Document Released: 10/08/2005 Document Revised: 10/29/2014 Document Reviewed: 10/23/2011 Elsevier Interactive Patient Education [employer].

## 2015-10-21 NOTE — Progress Notes (Signed)
   Subjective:    Patient ID: Erika Weaver  DOB: 07-24-1976, 39 y.o.    MRN: [id number]  HPI  Cough: Patient presents for an acute office visit with a 10 day history of nasal congestion, green nasal drainage, wheezing, coughing, sneezing, intermittent headache and not being able to sleep at night secondary to cough. She denies nausea, vomit, diarrhea, rash, fever or chills. 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 He has no asthma history. She is up-to-date with her flu and tetanus vaccinations. She has tried over-the-counter Robitussin and Aleve.   Past Medical History  Diagnosis Date  . Chicken pox as a child  . Overweight(278.02)   . Headache(784.0) 11/22/2011  . Allergic state 11/22/2011  . Visual changes 11/22/2011  . Elevated BP 11/22/2011  . Anxiety 11/22/2011  . Sinusitis 11/22/2011  . Plantar fasciitis 01/11/2012  . Fatigue 01/11/2012  . Pharyngitis 01/11/2012  . Sleep apnea 04/03/2012    setting 2  . Chronic kidney disease     kidney stones  . Hypertension   . Kidney stone 12/21/2012  . Back pain 01/31/2013  . 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 Dehydration 01/31/2013  . Preventative health care 08/18/2013  . BPV (benign positional vertigo) 08/23/2013  . Other and unspecified hyperlipidemia 03/07/2014   No Known Allergies Past Surgical History  Procedure Laterality Date  . Gum surgery    . Wisdom tooth extraction    . Perineal body repair      4 th degree tear with fistula between vagina and retum requiring 3 surgeries for correction  . Rectal fistula repair      x 2   Social History  Substance Use Topics  . Smoking status: Never Smoker   . Smokeless tobacco: Never Used  . Alcohol Use: No    Review of Systems Negative, with the exception of above mentioned in HPI     Objective:   Physical Exam BP 125/82 mmHg  Pulse 72  Temp(Src) 98.2 F (36.8 C) (Oral)  Resp 20  Wt 227 lb (102.967 kg)  SpO2 99%  LMP 09/22/2015 Body mass index is 37.19  kg/(m^2). Gen: Afebrile. No acute distress. Nontoxic in appearance. Well developed, well nourished, female. HENT: AT. [place]. Bilateral TM visualized, shiny/4 appearance. MMM, no oral lesions. Bilateral nares with erythema and swelling. Throat no erythema, no exudates. Cough present, Hoarseness present, TTP maxillary sinuses Eyes:Pupils Equal Round Reactive to light, Extraocular movements intact,  Conjunctiva without redness, discharge or icterus. Neck/lymp/endocrine: Supple, anterior cervical lymphadenopathy CV: RRR no murmur appreciated, Chest: CTAB, no wheeze or crackles. Good air movement, normal respiratory effort Abd: Soft. NTND. BS present Skin: No rashes, purpura or petechiae.    Assessment & Plan:  [name] is a 39 y.o. present for acute OV with sinusitis.  1. Acute maxillary sinusitis, recurrence not specified -Start Flonase, nasal saline 3 times a day. She is regular Mucinex to decrease secretions.  -start using humidifier nightly. - Augmentin antibiotic prescribed. - amoxicillin-clavulanate (AUGMENTIN) 875-125 MG tablet; Take 1 tablet by mouth 2 (two) times daily.  Dispense: 20 tablet; Refill: 0 - fluticasone (FLONASE) 50 MCG/ACT nasal spray; Place 2 sprays into both nostrils daily.  Dispense: 16 g; Refill: 6 - AVS on sinusitis, patient to follow-up within 1 week with  PCP if not feeling improvement.

## 2015-11-02 NOTE — Telephone Encounter (Signed)
Pre-Visit Information

## 2015-11-08 ENCOUNTER — Encounter: Payer: Self-pay | Admitting: Family Medicine

## 2015-11-08 ENCOUNTER — Ambulatory Visit (INDEPENDENT_AMBULATORY_CARE_PROVIDER_SITE_OTHER): Payer: Managed Care, Other (non HMO) | Admitting: Family Medicine

## 2015-11-08 VITALS — BP 122/79 | HR 75 | Temp 98.4°F | Resp 20 | Wt 228.5 lb

## 2015-11-08 DIAGNOSIS — R072 Precordial pain: Secondary | ICD-10-CM

## 2015-11-08 DIAGNOSIS — K219 Gastro-esophageal reflux disease without esophagitis: Secondary | ICD-10-CM | POA: Diagnosis not present

## 2015-11-08 HISTORY — DX: Precordial pain: R07.2

## 2015-11-08 MED ORDER — OMEPRAZOLE 40 MG PO CPDR: 40.0000 mg | DELAYED_RELEASE_CAPSULE | Freq: Every day | ORAL | Status: DC

## 2015-11-08 NOTE — Progress Notes (Signed)
Patient ID: Erika Weaver, female   DOB: 1976/09/05, 40 y.o.   MRN: [id number]   Subjective:    Patient ID: Erika Weaver    DOB: 05-30-1976, 40 y.o.    MRN: [id number]  HPI  Substernal pain: Pt present with a 1 week history of retrosternal pain that started at 9:30 pm, while laying in bed, after eating vegetable and taco soup. Patient states the pain was severe, burning mid sternal, without radiation of pain, no diaphoresis, no numbness, no nausea and lasted for about 1 hour. She took and  Took an antiacid and it eased off some, she then  took pepto-bismol and it became  Better. She states she has continued to have intermittent milder burning after eating since, especially with red sauce or spicy food. The pain is not present currently. She does admit to stressing herself, and was panicked she could have had a heart attack. She has a strong family history of heart disease and stokes.She also had a family friend die of a heart attack at age 41 this past week. She has noticed an increase in belching after meals as well. She does endorse gaining weight. Body mass index is 37.43 kg/(m^2).  Past Medical History  Diagnosis Date  . Chicken pox as a child  . Overweight(278.02)   . Headache(784.0) 11/22/2011  . Allergic state 11/22/2011  . Visual changes 11/22/2011  . Elevated BP 11/22/2011  . Anxiety 11/22/2011  . Sinusitis 11/22/2011  . Plantar fasciitis 01/11/2012  . Fatigue 01/11/2012  . Pharyngitis 01/11/2012  . Sleep apnea 04/03/2012    setting 2  . Chronic kidney disease     kidney stones  . Hypertension   . Kidney stone 12/21/2012  . Back pain 01/31/2013  . 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 Dehydration 01/31/2013  . Preventative health care 08/18/2013  . BPV (benign positional vertigo) 08/23/2013  . Other and unspecified hyperlipidemia 03/07/2014   No Known Allergies Past Surgical History  Procedure Laterality Date  . Gum surgery    . Wisdom tooth extraction    . Perineal body repair       4 th degree tear with fistula between vagina and retum requiring 3 surgeries for correction  . Rectal fistula repair      x 2   Social History  Substance Use Topics  . Smoking status: Never Smoker   . Smokeless tobacco: Never Used  . Alcohol Use: No    Review of Systems Negative, with the exception of above mentioned in HPI     Objective:   Physical Exam BP 122/79 mmHg  Pulse 75  Temp(Src) 98.4 F (36.9 C) (Oral)  Resp 20  Wt 228 lb 8 oz (103.647 kg)  SpO2 100%  LMP 10/23/2015 Body mass index is 37.43 kg/(m^2). Gen: Afebrile. No acute distress. Nontoxic in appearance. Well developed, well nourished, Obese female.  HENT: AT. . Bilateral TM visualized. MMM, no oral lesions. No erythema, of throat. No cough or hoarseness on exam.  Eyes:Pupils Equal Round Reactive to light, Extraocular movements intact,  Conjunctiva without redness, discharge or icterus. CV: RRR , no m/c/g/r. No edema. No TTP chest wall. Chest: CTAB, no wheeze or crackles. Abd: Soft. Obese, NTND. BS present Skin: No rashes, purpura or petechiae.  EKG: Normal sinus rhythm, no ST changes. Unchanged from prior. Assessment & Plan:  Erika Weaver is a 40 y.o. present for acute OV  1. Retrosternal chest pain - Likely GERD related, with component of anxiety -  EKG 12-Lead--> normal sinus rhythm, unchanged from priors, no ST changes. - H. pylori antibody, IgG--> if pos will treat with triple therapy.   2. Gastroesophageal reflux disease, esophagitis presence not specified - GERD diet. Discussed not laying flat at least 3-4 hours after meals. No late night meals. No red sauce or spice, at least for a few weeks.  - +/- OTC zantac BID - omeprazole (PRILOSEC) 40 MG capsule; Take 1 capsule (40 mg total) by mouth daily.  Dispense: 30 capsule; Refill: 3 - F/U 8 weeks,then consider decreasing dose.

## 2015-11-08 NOTE — Patient Instructions (Signed)
Gastroesophageal Reflux Disease, Adult Normally, food travels down the esophagus and stays in the stomach to be digested. However, when a person has gastroesophageal reflux disease (GERD), food and stomach acid move back up into the esophagus. When this happens, the esophagus becomes sore and inflamed. Over time, GERD can create small holes (ulcers) in the lining of the esophagus.  CAUSES This condition is caused by a problem with the muscle between the esophagus and the stomach (lower esophageal sphincter, or LES). Normally, the LES muscle closes after food passes through the esophagus to the stomach. When the LES is weakened or abnormal, it does not close properly, and that allows food and stomach acid to go back up into the esophagus. The LES can be weakened by certain dietary substances, medicines, and medical conditions, including:  Tobacco use.  Pregnancy.  Having a hiatal hernia.  Heavy alcohol use.  Certain foods and beverages, such as coffee, chocolate, onions, and peppermint. RISK FACTORS This condition is more likely to develop in:  People who have an increased body weight.  People who have connective tissue disorders.  People who use NSAID medicines. SYMPTOMS Symptoms of this condition include:  Heartburn.  Difficult or painful swallowing.  The feeling of having a lump in the throat.  Abitter taste in the mouth.  Bad breath.  Having a large amount of saliva.  Having an upset or bloated stomach.  Belching.  Chest pain.  Shortness of breath or wheezing.  Ongoing (chronic) cough or a night-time cough.  Wearing away of tooth enamel.  Weight loss. Different conditions can cause chest pain. Make sure to see your health care provider if you experience chest pain. DIAGNOSIS Your health care provider will take a medical history and perform a physical exam. To determine if you have mild or severe GERD, your health care provider may also monitor how you respond  to treatment. You may also have other tests, including:  An endoscopy toexamine your stomach and esophagus with a small camera.  A test thatmeasures the acidity level in your esophagus.  A test thatmeasures how much pressure is on your esophagus.  A barium swallow or modified barium swallow to show the shape, size, and functioning of your esophagus. TREATMENT The goal of treatment is to help relieve your symptoms and to prevent complications. Treatment for this condition may vary depending on how severe your symptoms are. Your health care provider may recommend:  Changes to your diet.  Medicine.  Surgery. HOME CARE INSTRUCTIONS Diet  Follow a diet as recommended by your health care provider. This may involve avoiding foods and drinks such as:  Coffee and tea (with or without caffeine).  Drinks that containalcohol.  Energy drinks and sports drinks.  Carbonated drinks or sodas.  Chocolate and cocoa.  Peppermint and mint flavorings.  Garlic and onions.  Horseradish.  Spicy and acidic foods, including peppers, chili powder, curry powder, vinegar, hot sauces, and barbecue sauce.  Citrus fruit juices and citrus fruits, such as oranges, lemons, and limes.  Tomato-based foods, such as red sauce, chili, salsa, and pizza with red sauce.  Fried and fatty foods, such as donuts, french fries, potato chips, and high-fat dressings.  High-fat meats, such as hot dogs and fatty cuts of red and white meats, such as rib eye steak, sausage, ham, and bacon.  High-fat dairy items, such as whole milk, butter, and cream cheese.  Eat small, frequent meals instead of large meals.  Avoid drinking large amounts of liquid with your  meals.  Avoid eating meals during the 2-3 hours before bedtime.  Avoid lying down right after you eat.  Do not exercise right after you eat. General Instructions  Pay attention to any changes in your symptoms.  Take over-the-counter and prescription  medicines only as told by your health care provider. Do not take aspirin, ibuprofen, or other NSAIDs unless your health care provider told you to do so.  Do not use any tobacco products, including cigarettes, chewing tobacco, and e-cigarettes. If you need help quitting, ask your health care provider.  Wear loose-fitting clothing. Do not wear anything tight around your waist that causes pressure on your abdomen.  Raise (elevate) the head of your bed 6 inches (15cm).  Try to reduce your stress, such as with yoga or meditation. If you need help reducing stress, ask your health care provider.  If you are overweight, reduce your weight to an amount that is healthy for you. Ask your health care provider for guidance about a safe weight loss goal.  Keep all follow-up visits as told by your health care provider. This is important. SEEK MEDICAL CARE IF:  You have new symptoms.  You have unexplained weight loss.  You have difficulty swallowing, or it hurts to swallow.  You have wheezing or a persistent cough.  Your symptoms do not improve with treatment.  You have a hoarse voice. SEEK IMMEDIATE MEDICAL CARE IF:  You have pain in your arms, neck, jaw, teeth, or back.  You feel sweaty, dizzy, or light-headed.  You have chest pain or shortness of breath.  You vomit and your vomit looks like blood or coffee grounds.  You faint.  Your stool is bloody or black.  You cannot swallow, drink, or eat.   This information is not intended to replace advice given to you by your health care provider. Make sure you discuss any questions you have with your health care provider.   Document Released: 07/18/2005 Document Revised: 06/29/2015 Document Reviewed: 02/02/2015 Elsevier Interactive Patient Education 2016 [place] for Gastroesophageal Reflux Disease, Adult When you have gastroesophageal reflux disease (GERD), the foods you eat and your eating habits are very important.  Choosing the right foods can help ease the discomfort of GERD. WHAT GENERAL GUIDELINES DO I NEED TO FOLLOW?  Choose fruits, vegetables, whole grains, low-fat dairy products, and low-fat meat, fish, and poultry.  Limit fats such as oils, salad dressings, butter, nuts, and avocado.  Keep a food diary to identify foods that cause symptoms.  Avoid foods that cause reflux. These may be different for different people.  Eat frequent small meals instead of three large meals each day.  Eat your meals slowly, in a relaxed setting.  Limit fried foods.  Cook foods using methods other than frying.  Avoid drinking alcohol.  Avoid drinking large amounts of liquids with your meals.  Avoid bending over or lying down until 2-3 hours after eating. WHAT FOODS ARE NOT RECOMMENDED? The following are some foods and drinks that may worsen your symptoms: Vegetables Tomatoes. Tomato juice. Tomato and spaghetti sauce. Chili peppers. Onion and garlic. Horseradish. Fruits Oranges, grapefruit, and lemon (fruit and juice). Meats High-fat meats, fish, and poultry. This includes hot dogs, ribs, ham, sausage, salami, and bacon. Dairy Whole milk and chocolate milk. Sour cream. Cream. Butter. Ice cream. Cream cheese.  Beverages Coffee and tea, with or without caffeine. Carbonated beverages or energy drinks. Condiments Hot sauce. Barbecue sauce.  Sweets/Desserts Chocolate and cocoa. Donuts. Peppermint and  spearmint. Fats and Oils High-fat foods, including [place] fries and potato chips. Other Vinegar. Strong spices, such as black pepper, white pepper, red pepper, cayenne, curry powder, cloves, ginger, and chili powder. The items listed above may not be a complete list of foods and beverages to avoid. Contact your dietitian for more information.   This information is not intended to replace advice given to you by your health care provider. Make sure you discuss any questions you have with your health care  provider.   Document Released: 10/08/2005 Document Revised: 10/29/2014 Document Reviewed: 08/12/2013 Elsevier Interactive Patient Education [employer]. EKG normal today I have called in omeprazole 40 mg for you to take daily. Follow the GERD diet, no laying flat at least 3-4 hours after meals. You can add zantac as well (over the counter twice a day if desired). F/U 8 weeks, sooner if no improvement . We will then consider slowly decreasing dose.

## 2015-11-09 ENCOUNTER — Telehealth: Payer: Self-pay | Admitting: Family Medicine

## 2015-11-09 LAB — H. PYLORI ANTIBODY, IGG: H Pylori IgG: NEGATIVE

## 2015-11-09 NOTE — Telephone Encounter (Signed)
Please call pt: - her H.Pylori test was negative. Continue with PPI therapy as dicussed and follow GERD diet.

## 2015-11-10 ENCOUNTER — Ambulatory Visit: Payer: Managed Care, Other (non HMO) | Admitting: Family Medicine

## 2015-11-10 NOTE — Telephone Encounter (Signed)
Spoke with patient reviewed lab results and instructions.

## 2015-11-24 ENCOUNTER — Encounter (HOSPITAL_BASED_OUTPATIENT_CLINIC_OR_DEPARTMENT_OTHER): Payer: Self-pay

## 2015-11-24 ENCOUNTER — Emergency Department (HOSPITAL_BASED_OUTPATIENT_CLINIC_OR_DEPARTMENT_OTHER)
Admission: EM | Admit: 2015-11-24 | Discharge: 2015-11-24 | Disposition: A | Discharge status: Home / Self Care | Payer: Managed Care, Other (non HMO) | Attending: Emergency Medicine | Admitting: Emergency Medicine

## 2015-11-24 DIAGNOSIS — Z8639 Personal history of other endocrine, nutritional and metabolic disease: Secondary | ICD-10-CM | POA: Diagnosis not present

## 2015-11-24 DIAGNOSIS — N189 Chronic kidney disease, unspecified: Secondary | ICD-10-CM | POA: Diagnosis not present

## 2015-11-24 DIAGNOSIS — Y998 Other external cause status: Secondary | ICD-10-CM | POA: Insufficient documentation

## 2015-11-24 DIAGNOSIS — Z8739 Personal history of other diseases of the musculoskeletal system and connective tissue: Secondary | ICD-10-CM | POA: Insufficient documentation

## 2015-11-24 DIAGNOSIS — Z8619 Personal history of other infectious and parasitic diseases: Secondary | ICD-10-CM | POA: Insufficient documentation

## 2015-11-24 DIAGNOSIS — I129 Hypertensive chronic kidney disease with stage 1 through stage 4 chronic kidney disease, or unspecified chronic kidney disease: Secondary | ICD-10-CM | POA: Diagnosis not present

## 2015-11-24 DIAGNOSIS — Z7951 Long term (current) use of inhaled steroids: Secondary | ICD-10-CM | POA: Insufficient documentation

## 2015-11-24 DIAGNOSIS — Z8669 Personal history of other diseases of the nervous system and sense organs: Secondary | ICD-10-CM | POA: Insufficient documentation

## 2015-11-24 DIAGNOSIS — Y9389 Activity, other specified: Secondary | ICD-10-CM | POA: Diagnosis not present

## 2015-11-24 DIAGNOSIS — Z8659 Personal history of other mental and behavioral disorders: Secondary | ICD-10-CM | POA: Diagnosis not present

## 2015-11-24 DIAGNOSIS — Z79899 Other long term (current) drug therapy: Secondary | ICD-10-CM | POA: Insufficient documentation

## 2015-11-24 DIAGNOSIS — W2107XA Struck by softball, initial encounter: Secondary | ICD-10-CM | POA: Insufficient documentation

## 2015-11-24 DIAGNOSIS — Z87442 Personal history of urinary calculi: Secondary | ICD-10-CM | POA: Insufficient documentation

## 2015-11-24 DIAGNOSIS — Z8709 Personal history of other diseases of the respiratory system: Secondary | ICD-10-CM | POA: Insufficient documentation

## 2015-11-24 DIAGNOSIS — E663 Overweight: Secondary | ICD-10-CM | POA: Diagnosis not present

## 2015-11-24 DIAGNOSIS — S0990XA Unspecified injury of head, initial encounter: Secondary | ICD-10-CM | POA: Diagnosis present

## 2015-11-24 DIAGNOSIS — Y9239 Other specified sports and athletic area as the place of occurrence of the external cause: Secondary | ICD-10-CM | POA: Diagnosis not present

## 2015-11-24 DIAGNOSIS — Z8742 Personal history of other diseases of the female genital tract: Secondary | ICD-10-CM | POA: Diagnosis not present

## 2015-11-24 MED ORDER — KETOROLAC TROMETHAMINE 60 MG/2ML IM SOLN
60.0000 mg | Freq: Once | INTRAMUSCULAR | Status: AC
  Administered 2015-11-24: 60 mg via INTRAMUSCULAR
  Filled 2015-11-24: qty 2

## 2015-11-24 NOTE — ED Provider Notes (Signed)
CSN: [id number]     Arrival date & time 11/24/15  2000 History   First MD Initiated Contact with Patient 11/24/15 2014     Chief Complaint  Patient presents with  . Head Injury    Patient is a 40 y.o. female presenting with head injury.  Head Injury Location:  R parietal Mechanism of injury: direct blow and sports   Pain details:    Quality:  Throbbing   Severity:  Moderate   Timing:  Constant   Progression:  Unchanged Chronicity:  New Relieved by:  Ice Worsened by:  Nothing tried Associated symptoms: blurred vision (fuzzy; resolved) and headache   Associated symptoms: no disorientation, no double vision, no focal weakness, no loss of consciousness, no memory loss, no nausea, no neck pain, no numbness and no vomiting    HPI  Erika Weaver is a 40 year old female presenting with a head injury. She was at her daughter's softball game when the pitcher threw an errant pitch and struck her in the head. Daughter is elementary school aged. She states that she initially had some dizziness and "fuzzy "vision. She was taken outside and sat down. She denies loss of consciousness. She states that her dizziness and fuzzy vision resolved soon afterwards she is not complaining of a throbbing headache at the site of impact. She was given ice which helped her pain. She has not taken any pain medications. She denies dizziness, lightheadedness, syncope, extremity weakness, numbness of the extremities, unsteady gait, confusion, altered mental status, double vision, neck pain, chest pain, shortness of breath, nausea, vomiting or any other injury sustained. She arrived by personal vehicle and walked the waiting room and to the treatment room.  Past Medical History  Diagnosis Date  . Chicken pox as a child  . Overweight(278.02)   . Headache(784.0) 11/22/2011  . Allergic state 11/22/2011  . Visual changes 11/22/2011  . Elevated BP 11/22/2011  . Anxiety 11/22/2011  . Sinusitis 11/22/2011  . Plantar fasciitis  01/11/2012  . Fatigue 01/11/2012  . Pharyngitis 01/11/2012  . Sleep apnea 04/03/2012    setting 2  . Chronic kidney disease     kidney stones  . Hypertension   . Kidney stone 12/21/2012  . Back pain 01/31/2013  . 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 Dehydration 01/31/2013  . Preventative health care 08/18/2013  . BPV (benign positional vertigo) 08/23/2013  . Other and unspecified hyperlipidemia 03/07/2014   Past Surgical History  Procedure Laterality Date  . Gum surgery    . Wisdom tooth extraction    . Perineal body repair      4 th degree tear with fistula between vagina and retum requiring 3 surgeries for correction  . Rectal fistula repair      x 2   Family History  Problem Relation Age of Onset  . Diabetes Mother     type 2  . Hypertension Mother   . Hyperlipidemia Mother   . Hypertension Father   . Hyperlipidemia Father   . Aneurysm Father     abdominal aorta  . Cancer Maternal Grandmother 82    colon, ovarian  . Other Maternal Grandfather 41    brain hemorrhage  . Stroke Maternal Grandfather   . Aneurysm Maternal Grandfather   . Heart attack Paternal Grandmother   . Heart disease Paternal Grandmother     MI  . Alzheimer's disease Paternal Grandfather   . Dementia Paternal Grandfather   . Heart attack Paternal Aunt   . Heart  disease Paternal Aunt      AAA rupture  . Other Paternal Uncle     cerebreal brain hemorrhage  . Stroke Paternal Uncle   . Aneurysm Paternal Uncle   . Asthma Daughter   . Other Daughter     peanut allergy  . Heart disease Maternal Aunt     s/p cabg. brain aneurysm s/p coiling  . Aneurysm Maternal Aunt     coil behind eye   Social History  Substance Use Topics  . Smoking status: Never Smoker   . Smokeless tobacco: Never Used  . Alcohol Use: No   OB History    No data available     Review of Systems  Eyes: Positive for blurred vision (fuzzy; resolved). Negative for double vision and visual disturbance.  Gastrointestinal: Negative  for nausea and vomiting.  Musculoskeletal: Negative for neck pain.  Neurological: Positive for dizziness (resolved) and headaches. Negative for focal weakness, loss of consciousness, syncope, facial asymmetry, speech difficulty, weakness, light-headedness and numbness.  Psychiatric/Behavioral: Negative for memory loss.  All other systems reviewed and are negative.     Allergies  Review of patient's allergies indicates no known allergies.  Home Medications   Prior to Admission medications   Medication Sig Start Date End Date Taking? Authorizing Provider  amLODipine (NORVASC) 5 MG tablet Take 1 tablet (5 mg total) by mouth daily. 03/10/15   Mosie Lukes, MD  fluticasone (FLONASE) 50 MCG/ACT nasal spray Place 2 sprays into both nostrils daily. 10/21/15   Renee A Kuneff, DO  omeprazole (PRILOSEC) 40 MG capsule Take 1 capsule (40 mg total) by mouth daily. 11/08/15   Renee A Kuneff, DO   BP 149/94 mmHg  Pulse 82  Temp(Src) 98.2 F (36.8 C) (Oral)  Resp 16  Ht 5' 5"  (1.651 m)  Wt 102.059 kg  BMI 37.44 kg/m2  SpO2 100%  LMP 10/23/2015 Physical Exam  Constitutional: She appears well-developed and well-nourished. No distress.  HENT:  Head: Normocephalic and atraumatic.  Mouth/Throat: Oropharynx is clear and moist. No oropharyngeal exudate.  Tenderness over the right parietal skull. No bony deformities, depressions or step-offs. No open wounds.  Eyes: Conjunctivae and EOM are normal. Pupils are equal, round, and reactive to light. Right eye exhibits no discharge. Left eye exhibits no discharge.  Neck: Normal range of motion. Neck supple. No rigidity.  No tenderness over the cervical spine. Full range of motion of the neck without pain.  Cardiovascular: Normal rate, regular rhythm and normal heart sounds.   Pulmonary/Chest: Effort normal and breath sounds normal. No respiratory distress.  Abdominal: Soft. There is no tenderness. There is no rebound and no guarding.  Musculoskeletal:  Normal range of motion.  Neurological: She is alert. No cranial nerve deficit. She exhibits normal muscle tone. Coordination normal.  Cranial nerves 3-12 tested and intact. 5/5 strength of all major muscle groups. Sensation to light touch intact throughout. Finger to nose coordinated. Walks with a steady gait unassisted.   Skin: Skin is warm and dry.  Psychiatric: She has a normal mood and affect. Her behavior is normal.  Nursing note and vitals reviewed.   ED Course  Procedures (including critical care time) Labs Review Labs Reviewed - No data to display  Imaging Review No results found. I have personally reviewed and evaluated these images and lab results as part of my medical decision-making.   EKG Interpretation None      MDM   Final diagnoses:  Head injury, initial encounter   Patient  with head injury which did not cause loss of consciousness but with persistent headache since the initial trauma.  No evidence of skull fracture on physical exam. Patient is not taking anticoagulants, is less than 65 and has no history of subarachnoid or subdural hemorrhage. Patient denies nausea, vomiting, amnesia, vision changes, cognitive or memory dysfunction or vertigo.  Patient with no focal neurological deficits on physical exam. Discussed the risk versus benefit of CT scan at this time I do not believe she warrants one as presentation is unlikely to be fracture or bleed. Patient agrees that CT is not indicated at this time. Advised to use over-the-counter medications like NSAIDs and Tylenol for pain relief. Discussed thoroughly symptoms to return to the emergency department including severe headaches, disequilibrium, vomiting, double vision, extremity weakness, difficulty ambulating, or any other concerning symptoms. Pt is stable for discharge.      Lahoma Crocker Iann Rodier, PA-C 11/24/15 2143  Deno Etienne, DO 11/24/15 2235

## 2015-11-24 NOTE — ED Notes (Addendum)
Thrown softball to head approx 1830-no break in skin-ice pack in place upon arrival-no LOC-steady gait

## 2015-11-24 NOTE — Discharge Instructions (Signed)
Use Tylenol, Motrin and ice as needed for pain control.   Head Injury, Adult You have a head injury. Headaches and throwing up (vomiting) are common after a head injury. It should be easy to wake up from sleeping. Sometimes you must stay in the hospital. Most problems happen within the first 24 hours. Side effects may occur up to 7-10 days after the injury.  WHAT ARE THE TYPES OF HEAD INJURIES? Head injuries can be as minor as a bump. Some head injuries can be more severe. More severe head injuries include:  A jarring injury to the brain (concussion).  A bruise of the brain (contusion). This mean there is bleeding in the brain that can cause swelling.  A cracked skull (skull fracture).  Bleeding in the brain that collects, clots, and forms a bump (hematoma). WHEN SHOULD I GET HELP RIGHT AWAY?   You are confused or sleepy.  You cannot be woken up.  You feel sick to your stomach (nauseous) or keep throwing up (vomiting).  Your dizziness or unsteadiness is getting worse.  You have very bad, lasting headaches that are not helped by medicine. Take medicines only as told by your doctor.  You cannot use your arms or legs like normal.  You cannot walk.  You notice changes in the black spots in the center of the colored part of your eye (pupil).  You have clear or bloody fluid coming from your nose or ears.  You have trouble seeing. During the next 24 hours after the injury, you must stay with someone who can watch you. This person should get help right away (call 911 in the [place] if you start to shake and are not able to control it (have seizures), you pass out, or you are unable to wake up. HOW CAN I PREVENT A HEAD INJURY IN THE FUTURE?  Wear seat belts.  Wear a helmet while bike riding and playing sports like football.  Stay away from dangerous activities around the house. WHEN CAN I RETURN TO NORMAL ACTIVITIES AND ATHLETICS? See your doctor before doing these activities. You  should not do normal activities or play contact sports until 1 week after the following symptoms have stopped:  Headache that does not go away.  Dizziness.  Poor attention.  Confusion.  Memory problems.  Sickness to your stomach or throwing up.  Tiredness.  Fussiness.  Bothered by bright lights or loud noises.  Anxiousness or depression.  Restless sleep. MAKE SURE YOU:   Understand these instructions.  Will watch your condition.  Will get help right away if you are not doing well or get worse.   This information is not intended to replace advice given to you by your health care provider. Make sure you discuss any questions you have with your health care provider.   Document Released: 09/20/2008 Document Revised: 10/29/2014 Document Reviewed: 06/15/2013 Elsevier Interactive Patient Education [employer].

## 2015-12-08 ENCOUNTER — Other Ambulatory Visit: Payer: Self-pay | Admitting: Family Medicine

## 2015-12-13 ENCOUNTER — Telehealth: Payer: Self-pay | Admitting: Family Medicine

## 2015-12-13 NOTE — Telephone Encounter (Signed)
Caller name: Pamala Hurry Relation to PQ:[id number]  Call back number: [phone] Pharmacy: [id number] - [place], [place] - [address] [place] [address] [place] [address] [place] [address] [phone] (Phone) [phone] (Fax)         Reason for call:  Mother requesting a refill meclizine (ANTIVERT) 25 MG tablet. Mother states Pharmacy prescribed an OTC and it causing her daughter to feel light headed. Mother states if you call her phone leave a detail message

## 2015-12-13 NOTE — Telephone Encounter (Signed)
Called the patient informed of PCP instructions.  She is going to check back with her pharmacist to see if they can help and will call back if prefers to try a prescription.

## 2015-12-13 NOTE — Telephone Encounter (Signed)
It is technically OTC med now, some insurance plans will pay if we write a prescription so we do. Her plan may no longer pay. Because it is generic, different manufacturers make it so it might be that the pharmacy is getting supply from a different supplier which she does not tolerate as well. Would ask the pharmacist if they have changed suppliers lately. They control that more at the pharmacy level.

## 2015-12-17 ENCOUNTER — Emergency Department (HOSPITAL_COMMUNITY)
Admission: EM | Admit: 2015-12-17 | Discharge: 2015-12-17 | Disposition: A | Discharge status: Home / Self Care | Payer: Managed Care, Other (non HMO) | Attending: Emergency Medicine | Admitting: Emergency Medicine

## 2015-12-17 ENCOUNTER — Encounter (HOSPITAL_COMMUNITY): Payer: Self-pay | Admitting: Emergency Medicine

## 2015-12-17 DIAGNOSIS — I129 Hypertensive chronic kidney disease with stage 1 through stage 4 chronic kidney disease, or unspecified chronic kidney disease: Secondary | ICD-10-CM | POA: Diagnosis not present

## 2015-12-17 DIAGNOSIS — R42 Dizziness and giddiness: Secondary | ICD-10-CM | POA: Insufficient documentation

## 2015-12-17 DIAGNOSIS — N189 Chronic kidney disease, unspecified: Secondary | ICD-10-CM | POA: Insufficient documentation

## 2015-12-17 DIAGNOSIS — Z8659 Personal history of other mental and behavioral disorders: Secondary | ICD-10-CM | POA: Insufficient documentation

## 2015-12-17 DIAGNOSIS — Z7951 Long term (current) use of inhaled steroids: Secondary | ICD-10-CM | POA: Diagnosis not present

## 2015-12-17 DIAGNOSIS — R51 Headache: Secondary | ICD-10-CM | POA: Insufficient documentation

## 2015-12-17 DIAGNOSIS — Z8619 Personal history of other infectious and parasitic diseases: Secondary | ICD-10-CM | POA: Insufficient documentation

## 2015-12-17 DIAGNOSIS — Z87440 Personal history of urinary (tract) infections: Secondary | ICD-10-CM | POA: Insufficient documentation

## 2015-12-17 DIAGNOSIS — Z8709 Personal history of other diseases of the respiratory system: Secondary | ICD-10-CM | POA: Diagnosis not present

## 2015-12-17 DIAGNOSIS — Z79899 Other long term (current) drug therapy: Secondary | ICD-10-CM | POA: Insufficient documentation

## 2015-12-17 DIAGNOSIS — Z8639 Personal history of other endocrine, nutritional and metabolic disease: Secondary | ICD-10-CM | POA: Insufficient documentation

## 2015-12-17 MED ORDER — MECLIZINE HCL 25 MG PO TABS: 50.0000 mg | ORAL_TABLET | Freq: Three times a day (TID) | ORAL | Status: DC | PRN

## 2015-12-17 MED ORDER — LORAZEPAM 0.5 MG PO TABS
0.5000 mg | ORAL_TABLET | Freq: Once | ORAL | Status: AC
  Administered 2015-12-17: 0.5 mg via ORAL
  Filled 2015-12-17: qty 1

## 2015-12-17 MED ORDER — LORAZEPAM 1 MG PO TABS: 1.0000 mg | ORAL_TABLET | Freq: Four times a day (QID) | ORAL | Status: DC | PRN

## 2015-12-17 MED ORDER — LORAZEPAM 1 MG PO TABS
2.0000 mg | ORAL_TABLET | Freq: Once | ORAL | Status: AC
  Administered 2015-12-17: 2 mg via ORAL
  Filled 2015-12-17: qty 2

## 2015-12-17 MED ORDER — MECLIZINE HCL 25 MG PO TABS
50.0000 mg | ORAL_TABLET | Freq: Once | ORAL | Status: AC
  Administered 2015-12-17: 50 mg via ORAL
  Filled 2015-12-17: qty 2

## 2015-12-17 NOTE — Discharge Instructions (Signed)
Benign Positional Vertigo Vertigo is the feeling that you or your surroundings are moving when they are not. Benign positional vertigo is the most common form of vertigo. The cause of this condition is not serious (is benign). This condition is triggered by certain movements and positions (is positional). This condition can be dangerous if it occurs while you are doing something that could endanger you or others, such as driving.  CAUSES In many cases, the cause of this condition is not known. It may be caused by a disturbance in an area of the inner ear that helps your brain to sense movement and balance. This disturbance can be caused by a viral infection (labyrinthitis), head injury, or repetitive motion. RISK FACTORS This condition is more likely to develop in:  Women.  People who are 5 years of age or older. SYMPTOMS Symptoms of this condition usually happen when you move your head or your eyes in different directions. Symptoms may start suddenly, and they usually last for less than a minute. Symptoms may include:  Loss of balance and falling.  Feeling like you are spinning or moving.  Feeling like your surroundings are spinning or moving.  Nausea and vomiting.  Blurred vision.  Dizziness.  Involuntary eye movement (nystagmus). Symptoms can be mild and cause only slight annoyance, or they can be severe and interfere with daily life. Episodes of benign positional vertigo may return (recur) over time, and they may be triggered by certain movements. Symptoms may improve over time. DIAGNOSIS This condition is usually diagnosed by medical history and a physical exam of the head, neck, and ears. You may be referred to a health care provider who specializes in ear, nose, and throat (ENT) problems (otolaryngologist) or a provider who specializes in disorders of the nervous system (neurologist). You may have additional testing, including:  MRI.  A CT scan.  Eye movement tests. Your  health care provider may ask you to change positions quickly while he or she watches you for symptoms of benign positional vertigo, such as nystagmus. Eye movement may be tested with an electronystagmogram (ENG), caloric stimulation, the Dix-Hallpike test, or the roll test.  An electroencephalogram (EEG). This records electrical activity in your brain.  Hearing tests. TREATMENT Usually, your health care provider will treat this by moving your head in specific positions to adjust your inner ear back to normal. Surgery may be needed in severe cases, but this is rare. In some cases, benign positional vertigo may resolve on its own in 2-4 weeks. HOME CARE INSTRUCTIONS Safety  Move slowly.Avoid sudden body or head movements.  Avoid driving.  Avoid operating heavy machinery.  Avoid doing any tasks that would be dangerous to you or others if a vertigo episode would occur.  If you have trouble walking or keeping your balance, try using a cane for stability. If you feel dizzy or unstable, sit down right away.  Return to your normal activities as told by your health care provider. Ask your health care provider what activities are safe for you. General Instructions  Take over-the-counter and prescription medicines only as told by your health care provider.  Avoid certain positions or movements as told by your health care provider.  Drink enough fluid to keep your urine clear or pale yellow.  Keep all follow-up visits as told by your health care provider. This is important. SEEK MEDICAL CARE IF:  You have a fever.  Your condition gets worse or you develop new symptoms.  Your family or friends  notice any behavioral changes.  Your nausea or vomiting gets worse.  You have numbness or a "pins and needles" sensation. SEEK IMMEDIATE MEDICAL CARE IF:  You have difficulty speaking or moving.  You are always dizzy.  You faint.  You develop severe headaches.  You have weakness in your  legs or arms.  You have changes in your hearing or vision.  You develop a stiff neck.  You develop sensitivity to light.   This information is not intended to replace advice given to you by your health care provider. Make sure you discuss any questions you have with your health care provider.   Document Released: 07/16/2006 Document Revised: 06/29/2015 Document Reviewed: 01/31/2015 Elsevier Interactive Patient Education 2016 [employer]  Dizziness Dizziness is a common problem. It is a feeling of unsteadiness or light-headedness. You may feel like you are about to faint. Dizziness can lead to injury if you stumble or fall. Anyone can become dizzy, but dizziness is more common in older adults. This condition can be caused by a number of things, including medicines, dehydration, or illness. HOME CARE INSTRUCTIONS Taking these steps may help with your condition: Eating and Drinking  Drink enough fluid to keep your urine clear or pale yellow. This helps to keep you from becoming dehydrated. Try to drink more clear fluids, such as water.  Do not drink alcohol.  Limit your caffeine intake if directed by your health care provider.  Limit your salt intake if directed by your health care provider. Activity  Avoid making quick movements.  Rise slowly from chairs and steady yourself until you feel okay.  In the morning, first sit up on the side of the bed. When you feel okay, stand slowly while you hold onto something until you know that your balance is fine.  Move your legs often if you need to stand in one place for a long time. Tighten and relax your muscles in your legs while you are standing.  Do not drive or operate heavy machinery if you feel dizzy.  Avoid bending down if you feel dizzy. Place items in your home so that they are easy for you to reach without leaning over. Lifestyle  Do not use any tobacco products, including cigarettes, chewing tobacco, or electronic  cigarettes. If you need help quitting, ask your health care provider.  Try to reduce your stress level, such as with yoga or meditation. Talk with your health care provider if you need help. General Instructions  Watch your dizziness for any changes.  Take medicines only as directed by your health care provider. Talk with your health care provider if you think that your dizziness is caused by a medicine that you are taking.  Tell a friend or a family member that you are feeling dizzy. If he or she notices any changes in your behavior, have this person call your health care provider.  Keep all follow-up visits as directed by your health care provider. This is important. SEEK MEDICAL CARE IF:  Your dizziness does not go away.  Your dizziness or light-headedness gets worse.  You feel nauseous.  You have reduced hearing.  You have new symptoms.  You are unsteady on your feet or you feel like the room is spinning. SEEK IMMEDIATE MEDICAL CARE IF:  You vomit or have diarrhea and are unable to eat or drink anything.  You have problems talking, walking, swallowing, or using your arms, hands, or legs.  You feel generally weak.  You are not  thinking clearly or you have trouble forming sentences. It may take a friend or family member to notice this.  You have chest pain, abdominal pain, shortness of breath, or sweating.  Your vision changes.  You notice any bleeding.  You have a headache.  You have neck pain or a stiff neck.  You have a fever.   This information is not intended to replace advice given to you by your health care provider. Make sure you discuss any questions you have with your health care provider.   Document Released: 04/03/2001 Document Revised: 02/22/2015 Document Reviewed: 10/04/2014 Elsevier Interactive Patient Education [employer].

## 2015-12-17 NOTE — ED Provider Notes (Signed)
CSN: [id number]     Arrival date & time 12/17/15  1559 History   First MD Initiated Contact with Patient 12/17/15 1655     Chief Complaint  Patient presents with  . Dizziness  . Headache     (Consider location/radiation/quality/duration/timing/severity/associated sxs/prior Treatment) HPI Comments: Patient here complaining of worsening dizziness described as her prior vertiginous episodes. Has had these same symptoms for the past 5 years and has been seen by multiple specialists and according to her has had multiple imaging tests without a diagnosis. She has been a progressive symptoms for the past week. Has been using over-the-counter Antivert without relief. She has been taking 25 mg at a time. She has had nausea but no vomiting. Is able to ambulate. Room spinning is worse with certain head movements. No fever or chills. No focal weakness  Patient is a 40 y.o. female presenting with dizziness and headaches. The history is provided by the patient and a parent.  Dizziness Associated symptoms: headaches   Headache Associated symptoms: dizziness     Past Medical History  Diagnosis Date  . Chicken pox as a child  . Overweight(278.02)   . Headache(784.0) 11/22/2011  . Allergic state 11/22/2011  . Visual changes 11/22/2011  . Elevated BP 11/22/2011  . Anxiety 11/22/2011  . Sinusitis 11/22/2011  . Plantar fasciitis 01/11/2012  . Fatigue 01/11/2012  . Pharyngitis 01/11/2012  . Sleep apnea 04/03/2012    setting 2  . Chronic kidney disease     kidney stones  . Hypertension   . Kidney stone 12/21/2012  . Back pain 01/31/2013  . 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 Dehydration 01/31/2013  . Preventative health care 08/18/2013  . BPV (benign positional vertigo) 08/23/2013  . Other and unspecified hyperlipidemia 03/07/2014   Past Surgical History  Procedure Laterality Date  . Gum surgery    . Wisdom tooth extraction    . Perineal body repair      4 th degree tear with fistula between vagina and  retum requiring 3 surgeries for correction  . Rectal fistula repair      x 2   Family History  Problem Relation Age of Onset  . Diabetes Mother     type 2  . Hypertension Mother   . Hyperlipidemia Mother   . Hypertension Father   . Hyperlipidemia Father   . Aneurysm Father     abdominal aorta  . Cancer Maternal Grandmother 82    colon, ovarian  . Other Maternal Grandfather 41    brain hemorrhage  . Stroke Maternal Grandfather   . Aneurysm Maternal Grandfather   . Heart attack Paternal Grandmother   . Heart disease Paternal Grandmother     MI  . Alzheimer's disease Paternal Grandfather   . Dementia Paternal Grandfather   . Heart attack Paternal Aunt   . Heart disease Paternal Aunt      AAA rupture  . Other Paternal Uncle     cerebreal brain hemorrhage  . Stroke Paternal Uncle   . Aneurysm Paternal Uncle   . Asthma Daughter   . Other Daughter     peanut allergy  . Heart disease Maternal Aunt     s/p cabg. brain aneurysm s/p coiling  . Aneurysm Maternal Aunt     coil behind eye   Social History  Substance Use Topics  . Smoking status: Never Smoker   . Smokeless tobacco: Never Used  . Alcohol Use: No   OB History    No data available  Review of Systems  Neurological: Positive for dizziness and headaches.  All other systems reviewed and are negative.     Allergies  Review of patient's allergies indicates no known allergies.  Home Medications   Prior to Admission medications   Medication Sig Start Date End Date Taking? Authorizing Provider  amLODipine (NORVASC) 5 MG tablet Take 1 tablet (5 mg total) by mouth daily. 03/10/15   Mosie Lukes, MD  fluticasone (FLONASE) 50 MCG/ACT nasal spray Place 2 sprays into both nostrils daily. 10/21/15   Renee A Kuneff, DO  omeprazole (PRILOSEC) 40 MG capsule Take 1 capsule (40 mg total) by mouth daily. 11/08/15   Renee A Kuneff, DO   BP 154/90 mmHg  Pulse 72  Temp(Src) 97.4 F (36.3 C) (Oral)  Resp 18  SpO2  100% Physical Exam  Constitutional: She is oriented to person, place, and time. She appears well-developed and well-nourished.  Non-toxic appearance. No distress.  HENT:  Head: Normocephalic and atraumatic.  Eyes: Conjunctivae, EOM and lids are normal. Pupils are equal, round, and reactive to light.  Neck: Normal range of motion. Neck supple. No tracheal deviation present. No thyroid mass present.  Cardiovascular: Normal rate, regular rhythm and normal heart sounds.  Exam reveals no gallop.   No murmur heard. Pulmonary/Chest: Effort normal and breath sounds normal. No stridor. No respiratory distress. She has no decreased breath sounds. She has no wheezes. She has no rhonchi. She has no rales.  Abdominal: Soft. Normal appearance and bowel sounds are normal. She exhibits no distension. There is no tenderness. There is no rebound and no CVA tenderness.  Musculoskeletal: Normal range of motion. She exhibits no edema or tenderness.  Neurological: She is alert and oriented to person, place, and time. She displays no tremor. No cranial nerve deficit or sensory deficit. Coordination and gait normal. GCS eye subscore is 4. GCS verbal subscore is 5. GCS motor subscore is 6.  Horizontal nystagmus noted  Skin: Skin is warm and dry. No abrasion and no rash noted.  Psychiatric: She has a normal mood and affect. Her speech is normal and behavior is normal.  Nursing note and vitals reviewed.   ED Course  Procedures (including critical care time) Labs Review Labs Reviewed - No data to display  Imaging Review No results found. I have personally reviewed and evaluated these images and lab results as part of my medical decision-making.   EKG Interpretation None      MDM   Final diagnoses:  None   patient given Antivert and Ativan 2 and feels better. Symptoms consistent with peripheral vertigo and no evidence of central vertigo. Was instructed to follow-up with her ENT doctor    Lacretia Leigh,  MD 12/17/15 2007

## 2015-12-17 NOTE — ED Notes (Signed)
Pt reports dizziness (room spinning vs near syncope) and HA since Sunday. Has had episodes of the same for the past 5 years. Pt took home meclizine with no relief. Also having intermittent blurred vision and nausea which is not unusual for dizzy spells.

## 2015-12-19 ENCOUNTER — Other Ambulatory Visit: Payer: Self-pay | Admitting: Family Medicine

## 2015-12-19 ENCOUNTER — Telehealth: Payer: Self-pay | Admitting: Family Medicine

## 2015-12-19 DIAGNOSIS — H8110 Benign paroxysmal vertigo, unspecified ear: Secondary | ICD-10-CM

## 2015-12-19 DIAGNOSIS — J329 Chronic sinusitis, unspecified: Secondary | ICD-10-CM

## 2015-12-19 NOTE — Telephone Encounter (Signed)
Pt mom called for her requesting referral to ENT Dr. Radene Journey. ENT told her they needed referral from [name]. Pt was seen in the ER Saturday for pts vertigo. She's been dizzy and dealing with vertigo for 5 years. It keeps getting worse. Pt mother requesting call from Bertha. Please call mom, Tommie Raymond, ph# [phone].

## 2015-12-19 NOTE — Telephone Encounter (Signed)
Spoke to her mom and please refer to ENT Dr. Lucia Gaskins or Dr Felecia Shelling (neurologist)

## 2015-12-21 ENCOUNTER — Telehealth: Payer: Self-pay | Admitting: Family Medicine

## 2015-12-21 DIAGNOSIS — R42 Dizziness and giddiness: Secondary | ICD-10-CM

## 2015-12-21 NOTE — Telephone Encounter (Signed)
Pt notified and made aware.  She stated understanding.  While on the phone she says that she went to ENT today, vertigo was ruled out, and ENT is requesting that she has labs drawn such as TSH, blood sugar, iron, etc "to rule out stuff."  She is coming to have labs drawn in the morning and wanted to know if these labs could added.  Please advise.

## 2015-12-21 NOTE — Telephone Encounter (Signed)
Please let patient know that I have placed urgent referral to Dr. Felecia Shelling in Dr. Frederik Pear absence. If any acutely worsening symptoms, she needs to go to ER.

## 2015-12-21 NOTE — Telephone Encounter (Signed)
Relation to JS:RPRX Call back number: [phone]    Reason for call:  Patient requesting a referral to neurologist due to her dizziness, and stated ENT reccommended patient to have a full blood panel. Patient has a lab appointment already scheduled for 12/21/15

## 2015-12-21 NOTE — Telephone Encounter (Signed)
Ok to add with diagnosis of dizzineess

## 2015-12-21 NOTE — Telephone Encounter (Signed)
Labs ordered.

## 2015-12-21 NOTE — Telephone Encounter (Signed)
Would like to see Dr. Arlice Colt with [facility].   [facility] [address] [place], [place] [address] Tel: [phone] Fax: [phone]  ENT ruled out vertigo and said it was neuro and recommended ASAP appt.

## 2015-12-22 ENCOUNTER — Other Ambulatory Visit (INDEPENDENT_AMBULATORY_CARE_PROVIDER_SITE_OTHER): Payer: Managed Care, Other (non HMO)

## 2015-12-22 DIAGNOSIS — D72829 Elevated white blood cell count, unspecified: Secondary | ICD-10-CM | POA: Diagnosis not present

## 2015-12-22 DIAGNOSIS — R42 Dizziness and giddiness: Secondary | ICD-10-CM

## 2015-12-22 LAB — CBC WITH DIFFERENTIAL/PLATELET
BASOS PCT: 0.4 % (ref 0.0–3.0)
Basophils Absolute: 0.1 10*3/uL (ref 0.0–0.1)
EOS ABS: 0.2 10*3/uL (ref 0.0–0.7)
EOS PCT: 1.9 % (ref 0.0–5.0)
HEMATOCRIT: 40.7 % (ref 36.0–46.0)
HEMOGLOBIN: 14 g/dL (ref 12.0–15.0)
LYMPHS PCT: 27.9 % (ref 12.0–46.0)
Lymphs Abs: 3.2 10*3/uL (ref 0.7–4.0)
MCHC: 34.4 g/dL (ref 30.0–36.0)
MCV: 89.6 fl (ref 78.0–100.0)
MONOS PCT: 5.8 % (ref 3.0–12.0)
Monocytes Absolute: 0.7 10*3/uL (ref 0.1–1.0)
NEUTROS ABS: 7.3 10*3/uL (ref 1.4–7.7)
Neutrophils Relative %: 64 % (ref 43.0–77.0)
PLATELETS: 280 10*3/uL (ref 150.0–400.0)
RBC: 4.54 Mil/uL (ref 3.87–5.11)
RDW: 12.9 % (ref 11.5–15.5)
WBC: 11.4 10*3/uL — AB (ref 4.0–10.5)

## 2015-12-22 LAB — COMPREHENSIVE METABOLIC PANEL
ALBUMIN: 3.7 g/dL (ref 3.5–5.2)
ALK PHOS: 68 U/L (ref 39–117)
ALT: 31 U/L (ref 0–35)
AST: 22 U/L (ref 0–37)
BILIRUBIN TOTAL: 0.5 mg/dL (ref 0.2–1.2)
BUN: 7 mg/dL (ref 6–23)
CO2: 28 mEq/L (ref 19–32)
Calcium: 9.2 mg/dL (ref 8.4–10.5)
Chloride: 105 mEq/L (ref 96–112)
Creatinine, Ser: 0.66 mg/dL (ref 0.40–1.20)
GFR: 104.73 mL/min (ref >60.00)
GLUCOSE: 102 mg/dL — AB (ref 70–99)
Potassium: 3.9 mEq/L (ref 3.5–5.1)
Sodium: 136 mEq/L (ref 135–145)
TOTAL PROTEIN: 7.2 g/dL (ref 6.0–8.3)

## 2015-12-22 LAB — TSH: TSH: 1.05 u[IU]/mL (ref 0.35–4.50)

## 2015-12-22 LAB — FERRITIN: FERRITIN: 45.6 ng/mL (ref 10.0–291.0)

## 2015-12-29 ENCOUNTER — Telehealth: Payer: Self-pay | Admitting: Neurology

## 2015-12-29 ENCOUNTER — Ambulatory Visit (INDEPENDENT_AMBULATORY_CARE_PROVIDER_SITE_OTHER): Payer: Managed Care, Other (non HMO) | Admitting: Neurology

## 2015-12-29 ENCOUNTER — Encounter: Payer: Self-pay | Admitting: Neurology

## 2015-12-29 VITALS — BP 152/90 | HR 76 | Resp 16 | Ht 65.0 in | Wt 232.4 lb

## 2015-12-29 DIAGNOSIS — R42 Dizziness and giddiness: Secondary | ICD-10-CM | POA: Diagnosis not present

## 2015-12-29 DIAGNOSIS — G4489 Other headache syndrome: Secondary | ICD-10-CM | POA: Insufficient documentation

## 2015-12-29 DIAGNOSIS — G4733 Obstructive sleep apnea (adult) (pediatric): Secondary | ICD-10-CM | POA: Diagnosis not present

## 2015-12-29 DIAGNOSIS — F419 Anxiety disorder, unspecified: Secondary | ICD-10-CM

## 2015-12-29 MED ORDER — BUSPIRONE HCL 15 MG PO TABS: 15.0000 mg | ORAL_TABLET | Freq: Three times a day (TID) | ORAL | Status: DC

## 2015-12-29 MED ORDER — ALPRAZOLAM 0.5 MG PO TABS
ORAL_TABLET | ORAL | Status: DC
Start: 2015-12-29 — End: 2016-02-16

## 2015-12-29 NOTE — Progress Notes (Signed)
[facility]  PATIENT: Erika Weaver DOB: April 16, 1976  REFERRING DOCTOR OR PCP:  Penni Homans SOURCE/Data [place]: Patient, records in EMR, lab results, MRI results, MRI images on PACS.  _________________________________   HISTORICAL  CHIEF COMPLAINT:  Chief Complaint  Patient presents with  . Dizziness    Sts. she has had intermittent episodes of feeling lightheaded for the last 5 years.  Sts. she was hit in the head with a softball 4 weeks ago.  No LOC.  She was seen at the [facility] on [facility] and again at [facility].  No imaging studies.  No relief with Meclezine.  Sts. has seen ENT and was told problem is not with her ears.[place]  . Diziness    Sts. she has been seen by [facility] for dizziness but they couldn't find anything wrong./fim  . Sleep Apnea    Sts. she is compliant with CPAP.  Her pcp Penni Homans) treats her for this/fim    HISTORY OF PRESENT ILLNESS:   I had the pleasure seeing you patient, Erika Weaver, at [facility] for neurologic consultation regarding her lightheadedness. As you know, she is a 40 year old woman who has had intermittent lightheadedness with changes in balance for the past 5 or 6 years. In the past, her episodes would last several hours to a day but would resolve. She has been evaluated with several studies including an MRI of the brain. I personally reviewed the MRI from 2015 and it is normal except for minimal chronic ethmoid sinusitis.    She describes the current dizziness as a lightheadedness that does not change significantly with position. Therefore, it is present when she is laying down or sitting as well as when she is standing. Orthostatic vital signs were normal today. She does not get vertigo there is no positional element. She was evaluated by ENT and no source or her symptoms were found. She denies any change in hearing.  More recently, about a month ago, she was hit in the head with a  softball while at one of her kids games. Not loss of consciousness. [facility] to [facility] in [facility] and was evaluated. For her headache she was given a shot. A CAT scan was not performed. She was also prescribed meclizine. Meclizine did not help  vertigo. Additionally, she tried a scopolamine patch and vomiting without any benefit.  She continues to have a mild headache but it is much better than it was last month after she was hit in the head. She has had headaches off and on for the past 5 or 6 years as well.   The headache is in the entire head and is nagging more than painful at this point. Shortly after she was hit in the head by a softball, the pain was much worse and it was also worse where she was struck in the right forehead.  She also has obstructive sleep apnea and uses CPAP every night. She falls asleep easily and stays asleep fairly well. She denies any insomnia. She denies much excessive daytime sleepiness, though there was not much prior to the CPAP either.  She denies any anxiety or depression. She never tried oral benzodiazepines for her dizziness or lightheadedness, though she was given an IV Valium when she was in the emergency room and she did not like the way that she felt.   REVIEW OF SYSTEMS: Constitutional: No fevers, chills, sweats, or change in appetite Eyes:  No visual changes, double vision, eye pain Ear, nose and throat: No hearing loss, ear pain, nasal congestion, sore throat Cardiovascular: No chest pain, palpitations Respiratory: No shortness of breath at rest or with exertion.   No wheezes GastrointestinaI: No nausea, vomiting, diarrhea, abdominal pain, fecal incontinence Genitourinary: No dysuria, urinary retention or frequency.  No nocturia. Musculoskeletal: No neck pain, back pain Integumentary: No rash, pruritus, skin lesions Neurological: as above Psychiatric: No depression at this time.  No anxiety Endocrine: No palpitations,  diaphoresis, change in appetite, change in weigh or increased thirst Hematologic/Lymphatic: No anemia, purpura, petechiae. Allergic/Immunologic: No itchy/runny eyes, nasal congestion, recent allergic reactions, rashes  ALLERGIES: No Known Allergies  HOME MEDICATIONS:  Current outpatient prescriptions:  .  amLODipine (NORVASC) 5 MG tablet, Take 1 tablet (5 mg total) by mouth daily., Disp: 90 tablet, Rfl: 3 .  fluticasone (FLONASE) 50 MCG/ACT nasal spray, Place 2 sprays into both nostrils daily., Disp: 16 g, Rfl: 6 .  LORazepam (ATIVAN) 1 MG tablet, Take 1 tablet (1 mg total) by mouth every 6 (six) hours as needed (Dizziness)., Disp: 15 tablet, Rfl: 0 .  meclizine (ANTIVERT) 25 MG tablet, Take 2 tablets (50 mg total) by mouth 3 (three) times daily as needed for dizziness., Disp: 30 tablet, Rfl: 0 .  omeprazole (PRILOSEC) 40 MG capsule, Take 1 capsule (40 mg total) by mouth daily. (Patient not taking: Reported on 12/17/2015), Disp: 30 capsule, Rfl: 3  PAST MEDICAL HISTORY: Past Medical History  Diagnosis Date  . Chicken pox as a child  . Overweight(278.02)   . Headache(784.0) 11/22/2011  . Allergic state 11/22/2011  . Visual changes 11/22/2011  . Elevated BP 11/22/2011  . Anxiety 11/22/2011  . Sinusitis 11/22/2011  . Plantar fasciitis 01/11/2012  . Fatigue 01/11/2012  . Pharyngitis 01/11/2012  . Sleep apnea 04/03/2012    setting 2  . Chronic kidney disease     kidney stones  . Hypertension   . Kidney stone 12/21/2012  . Back pain 01/31/2013  . 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 Dehydration 01/31/2013  . Preventative health care 08/18/2013  . BPV (benign positional vertigo) 08/23/2013  . Other and unspecified hyperlipidemia 03/07/2014    PAST SURGICAL HISTORY: Past Surgical History  Procedure Laterality Date  . Gum surgery    . Wisdom tooth extraction    . Perineal body repair      4 th degree tear with fistula between vagina and retum requiring 3 surgeries for correction  . Rectal  fistula repair      x 2    FAMILY HISTORY: Family History  Problem Relation Age of Onset  . Diabetes Mother     type 2  . Hypertension Mother   . Hyperlipidemia Mother   . Hypertension Father   . Hyperlipidemia Father   . Aneurysm Father     abdominal aorta  . Cancer Maternal Grandmother 82    colon, ovarian  . Other Maternal Grandfather 41    brain hemorrhage  . Stroke Maternal Grandfather   . Aneurysm Maternal Grandfather   . Heart attack Paternal Grandmother   . Heart disease Paternal Grandmother     MI  . Alzheimer's disease Paternal Grandfather   . Dementia Paternal Grandfather   . Heart attack Paternal Aunt   . Heart disease Paternal Aunt      AAA rupture  . Other Paternal Uncle     cerebreal brain hemorrhage  . Stroke Paternal Uncle   . Aneurysm Paternal Uncle   .  Asthma Daughter   . Other Daughter     peanut allergy  . Heart disease Maternal Aunt     s/p cabg. brain aneurysm s/p coiling  . Aneurysm Maternal Aunt     coil behind eye    SOCIAL HISTORY:  Social History   Social History  . Marital Status: Married    Spouse Name: N/A  . Number of Children: N/A  . Years of Education: N/A   Occupational History  . Not on file.   Social History Main Topics  . Smoking status: Never Smoker   . Smokeless tobacco: Never Used  . Alcohol Use: No  . Drug Use: No  . Sexual Activity:    Partners: Male    Birth Control/ Protection: None     Comment: lives with husband, daughter, no dietary restrictions   Other Topics Concern  . Not on file   Social History Narrative     PHYSICAL EXAM  Filed Vitals:   12/29/15 1014  BP: 152/90  Pulse: 76  Resp: 16  Height: 5' 5"  (1.651 m)  Weight: 232 lb 6.4 oz (105.416 kg)   Orthostatic VS for the past 24 hrs:  BP- Lying Pulse- Lying BP- Sitting Pulse- Sitting BP- Standing at 0 minutes Pulse- Standing at 0 minutes  12/29/15 1016 152/90 mmHg 76 (!) 148/94 mmHg 84 (!) 160/98 mmHg 84     Body mass index is  38.67 kg/(m^2).   General: The patient is well-developed and well-nourished and in no acute distress  Eyes:  Funduscopic exam shows normal optic discs and retinal vessels.  Neck: The neck is supple, no carotid bruits are noted.  The neck is nontender.  Cardiovascular: The heart has a regular rate and rhythm with a normal S1 and S2. There were no murmurs, gallops or rubs. Lungs are clear to auscultation.  Skin: Extremities are without significant edema.  Musculoskeletal:  Back is nontender  Neurologic Exam  Mental status: The patient is alert and oriented x 3 at the time of the examination. The patient has apparent normal recent and remote memory, with an apparently normal attention span and concentration ability.   Speech is normal.  Cranial nerves: Extraocular movements are full. Pupils are equal, round, and reactive to light and accomodation.  Visual fields are full.  Facial symmetry is present. There is good facial sensation to soft touch bilaterally.Facial strength is normal.  Trapezius and sternocleidomastoid strength is normal. No dysarthria is noted.  The tongue is midline, and the patient has symmetric elevation of the soft palate. No obvious hearing deficits are noted.  Motor:  Muscle bulk is normal.   Tone is normal. Strength is  5 / 5 in all 4 extremities.   Sensory: Sensory testing is intact to pinprick, soft touch and vibration sensation in all 4 extremities.  Coordination: Cerebellar testing reveals good finger-nose-finger and heel-to-shin bilaterally.  Gait and station: Station is normal.   Gait is normal. Tandem gait is normal. Romberg is negative.   Reflexes: Deep tendon reflexes are symmetric and normal bilaterally.   Plantar responses are flexor.    DIAGNOSTIC DATA (LABS, IMAGING, TESTING) - I reviewed patient records, labs, notes, testing and imaging myself where available.  Lab Results  Component Value Date   WBC 11.4* 12/22/2015   HGB 14.0 12/22/2015    HCT 40.7 12/22/2015   MCV 89.6 12/22/2015   PLT 280.0 12/22/2015      Component Value Date/Time   NA 136 12/22/2015 0905   K  3.9 12/22/2015 0905   CL 105 12/22/2015 0905   CO2 28 12/22/2015 0905   GLUCOSE 102* 12/22/2015 0905   BUN 7 12/22/2015 0905   CREATININE 0.66 12/22/2015 0905   CREATININE 0.72 05/27/2014 0944   CALCIUM 9.2 12/22/2015 0905   PROT 7.2 12/22/2015 0905   ALBUMIN 3.7 12/22/2015 0905   AST 22 12/22/2015 0905   ALT 31 12/22/2015 0905   ALKPHOS 68 12/22/2015 0905   BILITOT 0.5 12/22/2015 0905   GFRNONAA >90 11/26/2014 1030   GFRAA >90 11/26/2014 1030   Lab Results  Component Value Date   CHOL 199 09/22/2015   HDL 37.70* 09/22/2015   LDLCALC 139* 09/22/2015   TRIG 109.0 09/22/2015   CHOLHDL 5 09/22/2015   No results found for: HGBA1C No results found for: VITAMINB12 Lab Results  Component Value Date   TSH 1.05 12/22/2015       ASSESSMENT AND PLAN  Dizziness and giddiness - Plan: MR Brain W Wo Contrast  Other headache syndrome - Plan: MR Brain W Wo Contrast  Obstructive apnea  Anxiety    In summary, Erika Weaver is a 40 year old woman with lightheadedness over the past month that does not appear to be positionally dependent. The etiology is unclear. She does not have orthostatic hypotension today. She does not have any evidence of a peripheral vertigo.   We need to obtain an MRI of the brain to make sure that there are not changes involving the internal auditory canals or demyelination might cause symptoms. Possibly, the symptoms could be related to her elevated BP but she reports that the readings fluctuate quite a bit. Anxiety could also be playing a role.   She will continue to use CPAP for her OSA.  Today, her BP is elevated and she plans on discussing this further with you at her next visit. I will place her on BuSpar to see if anxiety treatment might be beneficial for her symptoms.  She will return to see me in 2 - 3 months or sooner if  there are new or worsening neurologic symptoms.   Arisbeth Purrington A. Felecia Shelling, MD, PhD 02/22/6269, 35:00 AM Certified in Neurology, Clinical Neurophysiology, Sleep Medicine, Pain Medicine and Neuroimaging  [facility] [address], [place], [place] [address] [phone]

## 2015-12-29 NOTE — Telephone Encounter (Signed)
Pt would like to have xanax or valium for MRI procedure.

## 2015-12-29 NOTE — Telephone Encounter (Signed)
Per RAS, ok for Alprazolam 0.3m #2 tabs with no r/f.  Pt. aware rx. will be sent to  her pharmacy.  Rx. awaiting RAS sig/fim

## 2015-12-29 NOTE — Telephone Encounter (Signed)
Alprazolam rx. faxed to [facility] in [place]/fim

## 2016-01-05 ENCOUNTER — Ambulatory Visit: Payer: Managed Care, Other (non HMO) | Admitting: Family Medicine

## 2016-01-06 ENCOUNTER — Ambulatory Visit
Admission: RE | Admit: 2016-01-06 | Discharge: 2016-01-06 | Disposition: A | Discharge status: Home / Self Care | Payer: Managed Care, Other (non HMO) | Source: Ambulatory Visit | Attending: Neurology | Admitting: Neurology

## 2016-01-06 DIAGNOSIS — R42 Dizziness and giddiness: Secondary | ICD-10-CM

## 2016-01-06 DIAGNOSIS — G4489 Other headache syndrome: Secondary | ICD-10-CM | POA: Diagnosis not present

## 2016-01-06 MED ORDER — GADOBENATE DIMEGLUMINE 529 MG/ML IV SOLN
20.0000 mL | Freq: Once | INTRAVENOUS | Status: AC | PRN
  Administered 2016-01-06: 20 mL via INTRAVENOUS

## 2016-01-11 ENCOUNTER — Telehealth: Payer: Self-pay | Admitting: *Deleted

## 2016-01-11 NOTE — Telephone Encounter (Signed)
LMTC/fim

## 2016-01-11 NOTE — Telephone Encounter (Signed)
I spoke to patient and she is aware of results and recommendations. Voiced understanding. She asked that I call back and leave these results on VM so she can play it for her family. VM left.

## 2016-01-11 NOTE — Telephone Encounter (Signed)
-----   Message from Britt Bottom, MD sent at 01/10/2016  6:01 PM EDT ----- Please let her know that I took a look at the MRI. It is essentially normal. She has a small amount of ethmoid sinusitis though it is not severe enough to recommend antibiotic.

## 2016-01-11 NOTE — Telephone Encounter (Signed)
Patient returned Faith's call, please call [phone].

## 2016-02-16 ENCOUNTER — Ambulatory Visit (INDEPENDENT_AMBULATORY_CARE_PROVIDER_SITE_OTHER): Payer: Managed Care, Other (non HMO) | Admitting: Family Medicine

## 2016-02-16 ENCOUNTER — Encounter: Payer: Self-pay | Admitting: Family Medicine

## 2016-02-16 ENCOUNTER — Ambulatory Visit (HOSPITAL_BASED_OUTPATIENT_CLINIC_OR_DEPARTMENT_OTHER)
Admission: RE | Admit: 2016-02-16 | Discharge: 2016-02-16 | Disposition: A | Discharge status: Home / Self Care | Payer: Managed Care, Other (non HMO) | Source: Ambulatory Visit | Attending: Family Medicine | Admitting: Family Medicine

## 2016-02-16 VITALS — BP 142/82 | HR 89 | Temp 99.5°F | Resp 16 | Wt 231.4 lb

## 2016-02-16 DIAGNOSIS — N200 Calculus of kidney: Secondary | ICD-10-CM | POA: Diagnosis not present

## 2016-02-16 DIAGNOSIS — R319 Hematuria, unspecified: Secondary | ICD-10-CM

## 2016-02-16 DIAGNOSIS — K5790 Diverticulosis of intestine, part unspecified, without perforation or abscess without bleeding: Secondary | ICD-10-CM | POA: Insufficient documentation

## 2016-02-16 DIAGNOSIS — R1084 Generalized abdominal pain: Secondary | ICD-10-CM | POA: Diagnosis not present

## 2016-02-16 LAB — CBC WITH DIFFERENTIAL/PLATELET
BASOS ABS: 0.1 10*3/uL (ref 0.0–0.1)
Basophils Relative: 0.4 % (ref 0.0–3.0)
EOS ABS: 0.1 10*3/uL (ref 0.0–0.7)
Eosinophils Relative: 0.5 % (ref 0.0–5.0)
HCT: 41 % (ref 36.0–46.0)
Hemoglobin: 13.9 g/dL (ref 12.0–15.0)
LYMPHS ABS: 2.4 10*3/uL (ref 0.7–4.0)
Lymphocytes Relative: 17.4 % (ref 12.0–46.0)
MCHC: 33.8 g/dL (ref 30.0–36.0)
MCV: 90.4 fl (ref 78.0–100.0)
MONO ABS: 1 10*3/uL (ref 0.1–1.0)
Monocytes Relative: 6.9 % (ref 3.0–12.0)
NEUTROS PCT: 74.8 % (ref 43.0–77.0)
Neutro Abs: 10.3 10*3/uL — ABNORMAL HIGH (ref 1.4–7.7)
Platelets: 297 10*3/uL (ref 150.0–400.0)
RBC: 4.54 Mil/uL (ref 3.87–5.11)
RDW: 12.7 % (ref 11.5–15.5)
WBC: 13.8 10*3/uL — AB (ref 4.0–10.5)

## 2016-02-16 LAB — BASIC METABOLIC PANEL
BUN: 10 mg/dL (ref 6–23)
CHLORIDE: 104 meq/L (ref 96–112)
CO2: 28 mEq/L (ref 19–32)
Calcium: 9.7 mg/dL (ref 8.4–10.5)
Creatinine, Ser: 0.75 mg/dL (ref 0.40–1.20)
GFR: 90.3 mL/min (ref >60.00)
Glucose, Bld: 119 mg/dL — ABNORMAL HIGH (ref 70–99)
POTASSIUM: 3.5 meq/L (ref 3.5–5.1)
SODIUM: 138 meq/L (ref 135–145)

## 2016-02-16 LAB — POCT URINALYSIS DIPSTICK
Bilirubin, UA: NEGATIVE
Glucose, UA: NEGATIVE
Ketones, UA: NEGATIVE
LEUKOCYTES UA: NEGATIVE
NITRITE UA: NEGATIVE
PH UA: 7
PROTEIN UA: NEGATIVE
Spec Grav, UA: 1.015
UROBILINOGEN UA: 0.2

## 2016-02-16 LAB — HEPATIC FUNCTION PANEL
ALBUMIN: 3.9 g/dL (ref 3.5–5.2)
ALK PHOS: 72 U/L (ref 39–117)
ALT: 37 U/L — AB (ref 0–35)
AST: 22 U/L (ref 0–37)
Bilirubin, Direct: 0.1 mg/dL (ref 0.0–0.3)
TOTAL PROTEIN: 7.7 g/dL (ref 6.0–8.3)
Total Bilirubin: 0.5 mg/dL (ref 0.2–1.2)

## 2016-02-16 MED ORDER — ONDANSETRON HCL 4 MG PO TABS: 4.0000 mg | ORAL_TABLET | Freq: Three times a day (TID) | ORAL | Status: DC | PRN

## 2016-02-16 MED ORDER — HYDROCODONE-ACETAMINOPHEN 5-325 MG PO TABS: 1.0000 | ORAL_TABLET | Freq: Four times a day (QID) | ORAL | Status: DC | PRN

## 2016-02-16 NOTE — Progress Notes (Signed)
   Subjective:    Patient ID: Erika Weaver, female    DOB: [birth year], 40 y.o.   MRN: [id number]  HPI R flank pain- sxs started suddenly at 3:30am, waking her from sleep w/ R flank pain, described as 'excrutiating'.  Vomited this AM.  Paced the floor for an hour w/ mild improvement.  Pain resolved for most of the morning.  Again developed pain while out for lunch at [employer]- vomited x2.  Pain migrated from R flank to back.  Pt has hx of kidney stones- [facility].   Review of Systems For ROS see HPI     Objective:   Physical Exam  Constitutional: She is oriented to person, place, and time. She appears well-developed and well-nourished. No distress.  obese  HENT:  Head: Normocephalic and atraumatic.  Eyes: Conjunctivae and EOM are normal. Pupils are equal, round, and reactive to light.  Cardiovascular: Normal rate, regular rhythm, normal heart sounds and intact distal pulses.   Pulmonary/Chest: Effort normal and breath sounds normal. No respiratory distress. She has no wheezes. She has no rales.  Abdominal: Soft. Bowel sounds are normal. She exhibits no distension. There is no tenderness. There is no rebound and no guarding.  Neurological: She is alert and oriented to person, place, and time.  Skin: Skin is warm and dry.  Psychiatric: She has a normal mood and affect. Her behavior is normal. Thought content normal.  Vitals reviewed.         Assessment & Plan:

## 2016-02-16 NOTE — Patient Instructions (Signed)
We will notify you of your lab results and CT scan Use the Hydrocodone as needed for pain Use the Zofran as needed for nausea Drink plenty of fluids If it is a stone, we'll refer you back to urology Call with any questions or concerns Elbert Ewings in there!!!

## 2016-02-16 NOTE — Progress Notes (Signed)
Pre visit review using our clinic review tool, if applicable. No additional management support is needed unless otherwise documented below in the visit note. 

## 2016-02-16 NOTE — Assessment & Plan Note (Signed)
New.  Pt is migrating as day progresses.  Vomiting after [place] food could be consistent w/ biliary colic but pt's 7:20TK episode and + hematuria in setting of hx of kidney stones makes stone more likely.  Will get labs to r/o biliary issues.  Reviewed supportive care and red flags that should prompt return.  Pt expressed understanding and is in agreement w/ plan.

## 2016-02-16 NOTE — Assessment & Plan Note (Signed)
New to provider, pt has hx of similar.  Sudden onset pain at 3:30am w/ + hematuria is consistent w/ kidney stone.  Get CT as pt has hx of obstructing stone that required lithotripsy.  Pain meds and Zofran prn.  Reviewed supportive care and red flags that should prompt return.  Pt expressed understanding and is in agreement w/ plan.

## 2016-02-17 LAB — URINE CULTURE

## 2016-03-01 ENCOUNTER — Ambulatory Visit (INDEPENDENT_AMBULATORY_CARE_PROVIDER_SITE_OTHER): Payer: Managed Care, Other (non HMO) | Admitting: Neurology

## 2016-03-01 ENCOUNTER — Encounter: Payer: Self-pay | Admitting: Neurology

## 2016-03-01 VITALS — BP 160/102 | HR 92 | Resp 18 | Ht 65.0 in | Wt 231.0 lb

## 2016-03-01 DIAGNOSIS — I10 Essential (primary) hypertension: Secondary | ICD-10-CM

## 2016-03-01 DIAGNOSIS — R519 Headache, unspecified: Secondary | ICD-10-CM

## 2016-03-01 DIAGNOSIS — F419 Anxiety disorder, unspecified: Secondary | ICD-10-CM | POA: Diagnosis not present

## 2016-03-01 DIAGNOSIS — G4733 Obstructive sleep apnea (adult) (pediatric): Secondary | ICD-10-CM | POA: Diagnosis not present

## 2016-03-01 DIAGNOSIS — R51 Headache: Secondary | ICD-10-CM

## 2016-03-01 DIAGNOSIS — R42 Dizziness and giddiness: Secondary | ICD-10-CM

## 2016-03-01 MED ORDER — TOPIRAMATE 50 MG PO TABS: 50.0000 mg | ORAL_TABLET | Freq: Two times a day (BID) | ORAL | Status: DC

## 2016-03-01 MED ORDER — BUSPIRONE HCL 15 MG PO TABS: 15.0000 mg | ORAL_TABLET | Freq: Two times a day (BID) | ORAL | Status: DC

## 2016-03-01 NOTE — Progress Notes (Signed)
[facility]  PATIENT: Erika Weaver DOB: [birth year]  REFERRING DOCTOR OR PCP:  Penni Homans SOURCE/Data [place]: Patient, records in EMR, lab results, MRI results, MRI images on PACS.  _________________________________   HISTORICAL  CHIEF COMPLAINT:  Chief Complaint  Patient presents with  . Dizziness    Sts. lightheadedness is about the same--"some days are worse than others."  Unable to identify potential triggers/fim    HISTORY OF PRESENT ILLNESS:   Erika Weaver is a 40 year old woman who has had intermittent lightheadedness with changes in balance for the past 5 or 6 years.  Current episodes are intermittent and occur 4-5 times a day lasting 1-2 hours at a time.   She feels lightheaded but not vertiginous.    No change in hearing.     Lightheadednes can occur in any position.   In the past, her episodes would last several hours to a day but would resolve.   MRI is essentially normal with just mild ethmoid sinusitis.   She was evaluated by ENT and no source or her symptoms were found. She denies any change in hearing.  HA:   She feels headache is better in general.  When present it is usually mild to moderate.   Meclizine has eased it when more severe.    She has had headaches off and on for the past 5 or 6 years as well.   The headache is in the entire head and is nagging more than painful at this point.   OSA:   She also has obstructive sleep apnea and uses CPAP most night. She falls asleep easily and stays asleep fairly well. She denies any insomnia. She denies much excessive daytime sleepiness, though there was not much prior to the CPAP either.   We discussed trying to use nightly.    Anxiety:   She denies depression. She notes mild anxiety and stress.,   Buspar has helped the anxiety some (takes only prn).    REVIEW OF SYSTEMS: Constitutional: No fevers, chills, sweats, or change in appetite Eyes: No visual changes, double vision, eye pain Ear,  nose and throat: No hearing loss, ear pain, nasal congestion, sore throat Cardiovascular: No chest pain, palpitations Respiratory: No shortness of breath at rest or with exertion.   No wheezes.  On CPAP for OSA GastrointestinaI: No nausea, vomiting, diarrhea, abdominal pain, fecal incontinence Genitourinary: No dysuria, urinary retention or frequency.  No nocturia. Musculoskeletal: No neck pain, back pain Integumentary: No rash, pruritus, skin lesions Neurological: as above Psychiatric: No depression at this time.  Notes mild anxiety and stress at times Endocrine: No palpitations, diaphoresis, change in appetite, change in weigh or increased thirst Hematologic/Lymphatic: No anemia, purpura, petechiae. Allergic/Immunologic: No itchy/runny eyes, nasal congestion, recent allergic reactions, rashes  ALLERGIES: No Known Allergies  HOME MEDICATIONS:  Current outpatient prescriptions:  .  amLODipine (NORVASC) 5 MG tablet, Take 1 tablet (5 mg total) by mouth daily., Disp: 90 tablet, Rfl: 3 .  fluticasone (FLONASE) 50 MCG/ACT nasal spray, Place 2 sprays into both nostrils daily., Disp: 16 g, Rfl: 6 .  busPIRone (BUSPAR) 15 MG tablet, Take 1 tablet (15 mg total) by mouth 2 (two) times daily., Disp: 60 tablet, Rfl: 5 .  HYDROcodone-acetaminophen (NORCO/VICODIN) 5-325 MG tablet, Take 1 tablet by mouth every 6 (six) hours as needed for moderate pain. (Patient not taking: Reported on 03/01/2016), Disp: 30 tablet, Rfl: 0 .  omeprazole (PRILOSEC) 40 MG capsule, Take 1 capsule (40 mg total) by mouth  daily. (Patient not taking: Reported on 12/17/2015), Disp: 30 capsule, Rfl: 3 .  ondansetron (ZOFRAN) 4 MG tablet, Take 1 tablet (4 mg total) by mouth every 8 (eight) hours as needed for nausea or vomiting. (Patient not taking: Reported on 03/01/2016), Disp: 30 tablet, Rfl: 0 .  topiramate (TOPAMAX) 50 MG tablet, Take 1 tablet (50 mg total) by mouth 2 (two) times daily., Disp: 30 tablet, Rfl: 11  PAST MEDICAL  HISTORY: Past Medical History  Diagnosis Date  . Chicken pox as a child  . Overweight(278.02)   . Headache(784.0) 11/22/2011  . Allergic state 11/22/2011  . Visual changes 11/22/2011  . Elevated BP 11/22/2011  . Anxiety 11/22/2011  . Sinusitis 11/22/2011  . Plantar fasciitis 01/11/2012  . Fatigue 01/11/2012  . Pharyngitis 01/11/2012  . Sleep apnea 04/03/2012    setting 2  . Chronic kidney disease     kidney stones  . Hypertension   . Kidney stone 12/21/2012  . Back pain 01/31/2013  . 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 Dehydration 01/31/2013  . Preventative health care 08/18/2013  . BPV (benign positional vertigo) 08/23/2013  . Other and unspecified hyperlipidemia 03/07/2014    PAST SURGICAL HISTORY: Past Surgical History  Procedure Laterality Date  . Gum surgery    . Wisdom tooth extraction    . Perineal body repair      4 th degree tear with fistula between vagina and retum requiring 3 surgeries for correction  . Rectal fistula repair      x 2    FAMILY HISTORY: Family History  Problem Relation Age of Onset  . Diabetes Mother     type 2  . Hypertension Mother   . Hyperlipidemia Mother   . Hypertension Father   . Hyperlipidemia Father   . Aneurysm Father     abdominal aorta  . Cancer Maternal Grandmother 82    colon, ovarian  . Other Maternal Grandfather 41    brain hemorrhage  . Stroke Maternal Grandfather   . Aneurysm Maternal Grandfather   . Heart attack Paternal Grandmother   . Heart disease Paternal Grandmother     MI  . Alzheimer's disease Paternal Grandfather   . Dementia Paternal Grandfather   . Heart attack Paternal Aunt   . Heart disease Paternal Aunt      AAA rupture  . Other Paternal Uncle     cerebreal brain hemorrhage  . Stroke Paternal Uncle   . Aneurysm Paternal Uncle   . Asthma Daughter   . Other Daughter     peanut allergy  . Heart disease Maternal Aunt     s/p cabg. brain aneurysm s/p coiling  . Aneurysm Maternal Aunt     coil behind eye      SOCIAL HISTORY:  Social History   Social History  . Marital Status: Married    Spouse Name: N/A  . Number of Children: N/A  . Years of Education: N/A   Occupational History  . Not on file.   Social History Main Topics  . Smoking status: Never Smoker   . Smokeless tobacco: Never Used  . Alcohol Use: No  . Drug Use: No  . Sexual Activity:    Partners: Male    Birth Control/ Protection: None     Comment: lives with husband, daughter, no dietary restrictions   Other Topics Concern  . Not on file   Social History Narrative     PHYSICAL EXAM  Filed Vitals:   03/01/16 1037  BP:  160/102  Pulse: 92  Resp: 18  Height: 5' 5"  (1.651 m)  Weight: 231 lb (104.781 kg)   No data found.    Body mass index is 38.44 kg/(m^2).   General: The patient is well-developed and well-nourished and in no acute distress   Neurologic Exam  Mental status: The patient is alert and oriented x 3 at the time of the examination. The patient has apparent normal recent and remote memory, with an apparently normal attention span and concentration ability.   Speech is normal.  Cranial nerves: Extraocular movements are full.  There is good facial sensation to soft touch bilaterally.Facial strength is normal.  Trapezius and sternocleidomastoid strength is normal. No dysarthria is noted.  The tongue is midline, and the patient has symmetric elevation of the soft palate. No obvious hearing deficits are noted.  Motor:  Muscle bulk is normal.   Tone is normal. Strength is  5 / 5 in all 4 extremities.   Sensory: Sensory testing is intact to soft touch and vibration sensation in all 4 extremities.  Coordination: Cerebellar testing reveals good finger-nose-fingerbilaterally.  Gait and station: Station is normal.   Gait is normal. Tandem gait is normal.   Reflexes: Deep tendon reflexes are symmetric and normal bilaterally.       DIAGNOSTIC DATA (LABS, IMAGING, TESTING) - I reviewed patient  records, labs, notes, testing and imaging myself where available.  Lab Results  Component Value Date   WBC 13.8* 02/16/2016   HGB 13.9 02/16/2016   HCT 41.0 02/16/2016   MCV 90.4 02/16/2016   PLT 297.0 02/16/2016      Component Value Date/Time   NA 138 02/16/2016 1423   K 3.5 02/16/2016 1423   CL 104 02/16/2016 1423   CO2 28 02/16/2016 1423   GLUCOSE 119* 02/16/2016 1423   BUN 10 02/16/2016 1423   CREATININE 0.75 02/16/2016 1423   CREATININE 0.72 05/27/2014 0944   CALCIUM 9.7 02/16/2016 1423   PROT 7.7 02/16/2016 1423   ALBUMIN 3.9 02/16/2016 1423   AST 22 02/16/2016 1423   ALT 37* 02/16/2016 1423   ALKPHOS 72 02/16/2016 1423   BILITOT 0.5 02/16/2016 1423   GFRNONAA >90 11/26/2014 1030   GFRAA >90 11/26/2014 1030   Lab Results  Component Value Date   CHOL 199 09/22/2015   HDL 37.70* 09/22/2015   LDLCALC 139* 09/22/2015   TRIG 109.0 09/22/2015   CHOLHDL 5 09/22/2015   No results found for: HGBA1C No results found for: VITAMINB12 Lab Results  Component Value Date   TSH 1.05 12/22/2015       ASSESSMENT AND PLAN  Dizziness and giddiness  Obstructive apnea  Anxiety  Essential hypertension  Nonintractable headache, unspecified chronicity pattern, unspecified headache type     1.   Take BuSpar 15 mg po bid regularly. 2.   Trial of topiramate  for possible migraine variant She will return to see me in 2 - 3 months or sooner if there are new or worsening neurologic symptoms.   Richard A. Felecia Shelling, MD, PhD 07/08/9149, 56:97 AM Certified in Neurology, Clinical Neurophysiology, Sleep Medicine, Pain Medicine and Neuroimaging  [facility] [address] [place], [place] [address] [phone]

## 2016-03-02 ENCOUNTER — Encounter: Payer: Self-pay | Admitting: Family Medicine

## 2016-03-02 ENCOUNTER — Other Ambulatory Visit (INDEPENDENT_AMBULATORY_CARE_PROVIDER_SITE_OTHER): Payer: Managed Care, Other (non HMO)

## 2016-03-02 DIAGNOSIS — D72829 Elevated white blood cell count, unspecified: Secondary | ICD-10-CM

## 2016-03-02 LAB — CBC WITH DIFFERENTIAL/PLATELET
BASOS ABS: 0 10*3/uL (ref 0.0–0.1)
Basophils Relative: 0.4 % (ref 0.0–3.0)
EOS ABS: 0.2 10*3/uL (ref 0.0–0.7)
Eosinophils Relative: 1.5 % (ref 0.0–5.0)
HCT: 40.1 % (ref 36.0–46.0)
Hemoglobin: 13.6 g/dL (ref 12.0–15.0)
LYMPHS ABS: 2.8 10*3/uL (ref 0.7–4.0)
Lymphocytes Relative: 25.3 % (ref 12.0–46.0)
MCHC: 34 g/dL (ref 30.0–36.0)
MCV: 90.7 fl (ref 78.0–100.0)
MONOS PCT: 6.5 % (ref 3.0–12.0)
Monocytes Absolute: 0.7 10*3/uL (ref 0.1–1.0)
NEUTROS ABS: 7.3 10*3/uL (ref 1.4–7.7)
NEUTROS PCT: 66.3 % (ref 43.0–77.0)
PLATELETS: 292 10*3/uL (ref 150.0–400.0)
RBC: 4.42 Mil/uL (ref 3.87–5.11)
RDW: 12.3 % (ref 11.5–15.5)
WBC: 10.9 10*3/uL — ABNORMAL HIGH (ref 4.0–10.5)

## 2016-03-05 ENCOUNTER — Encounter: Payer: Self-pay | Admitting: General Practice

## 2016-03-22 ENCOUNTER — Ambulatory Visit (INDEPENDENT_AMBULATORY_CARE_PROVIDER_SITE_OTHER): Payer: Managed Care, Other (non HMO) | Admitting: Family Medicine

## 2016-03-22 ENCOUNTER — Encounter: Payer: Self-pay | Admitting: Family Medicine

## 2016-03-22 VITALS — BP 120/80 | HR 76 | Temp 98.2°F | Ht 65.0 in | Wt 231.1 lb

## 2016-03-22 DIAGNOSIS — I10 Essential (primary) hypertension: Secondary | ICD-10-CM

## 2016-03-22 DIAGNOSIS — N200 Calculus of kidney: Secondary | ICD-10-CM

## 2016-03-22 DIAGNOSIS — R51 Headache: Secondary | ICD-10-CM

## 2016-03-22 DIAGNOSIS — E663 Overweight: Secondary | ICD-10-CM

## 2016-03-22 DIAGNOSIS — R319 Hematuria, unspecified: Secondary | ICD-10-CM

## 2016-03-22 DIAGNOSIS — E860 Dehydration: Secondary | ICD-10-CM

## 2016-03-22 DIAGNOSIS — E782 Mixed hyperlipidemia: Secondary | ICD-10-CM

## 2016-03-22 DIAGNOSIS — R519 Headache, unspecified: Secondary | ICD-10-CM

## 2016-03-22 DIAGNOSIS — J0100 Acute maxillary sinusitis, unspecified: Secondary | ICD-10-CM

## 2016-03-22 DIAGNOSIS — F419 Anxiety disorder, unspecified: Secondary | ICD-10-CM

## 2016-03-22 LAB — CBC WITH DIFFERENTIAL/PLATELET
BASOS PCT: 0.3 % (ref 0.0–3.0)
Basophils Absolute: 0 10*3/uL (ref 0.0–0.1)
EOS ABS: 0.3 10*3/uL (ref 0.0–0.7)
EOS PCT: 2 % (ref 0.0–5.0)
HEMATOCRIT: 42 % (ref 36.0–46.0)
HEMOGLOBIN: 14.2 g/dL (ref 12.0–15.0)
LYMPHS PCT: 26.3 % (ref 12.0–46.0)
Lymphs Abs: 3.8 10*3/uL (ref 0.7–4.0)
MCHC: 33.9 g/dL (ref 30.0–36.0)
MCV: 90.9 fl (ref 78.0–100.0)
MONO ABS: 1.1 10*3/uL — AB (ref 0.1–1.0)
Monocytes Relative: 7.7 % (ref 3.0–12.0)
Neutro Abs: 9.1 10*3/uL — ABNORMAL HIGH (ref 1.4–7.7)
Neutrophils Relative %: 63.7 % (ref 43.0–77.0)
Platelets: 304 10*3/uL (ref 150.0–400.0)
RBC: 4.62 Mil/uL (ref 3.87–5.11)
RDW: 12.7 % (ref 11.5–15.5)
WBC: 14.3 10*3/uL — AB (ref 4.0–10.5)

## 2016-03-22 LAB — URINALYSIS
Bilirubin Urine: NEGATIVE
Ketones, ur: NEGATIVE
LEUKOCYTES UA: NEGATIVE
Nitrite: NEGATIVE
SPECIFIC GRAVITY, URINE: 1.02 (ref 1.000–1.030)
Total Protein, Urine: NEGATIVE
URINE GLUCOSE: NEGATIVE
UROBILINOGEN UA: 0.2 (ref 0.0–1.0)
pH: 6 (ref 5.0–8.0)

## 2016-03-22 LAB — COMPREHENSIVE METABOLIC PANEL
ALBUMIN: 4.1 g/dL (ref 3.5–5.2)
ALK PHOS: 71 U/L (ref 39–117)
ALT: 48 U/L — ABNORMAL HIGH (ref 0–35)
AST: 35 U/L (ref 0–37)
BUN: 12 mg/dL (ref 6–23)
CALCIUM: 9.6 mg/dL (ref 8.4–10.5)
CO2: 30 mEq/L (ref 19–32)
CREATININE: 0.69 mg/dL (ref 0.40–1.20)
Chloride: 103 mEq/L (ref 96–112)
GFR: 99.37 mL/min (ref >60.00)
Glucose, Bld: 89 mg/dL (ref 70–99)
Potassium: 3.9 mEq/L (ref 3.5–5.1)
SODIUM: 136 meq/L (ref 135–145)
TOTAL PROTEIN: 7.9 g/dL (ref 6.0–8.3)
Total Bilirubin: 0.4 mg/dL (ref 0.2–1.2)

## 2016-03-22 MED ORDER — AMLODIPINE BESYLATE 5 MG PO TABS: 5.0000 mg | ORAL_TABLET | Freq: Every day | ORAL | Status: DC

## 2016-03-22 NOTE — Patient Instructions (Signed)
DASH Eating Plan  DASH stands for "Dietary Approaches to Stop Hypertension." The DASH eating plan is a healthy eating plan that has been shown to reduce high blood pressure (hypertension). Additional health benefits may include reducing the risk of type 2 diabetes mellitus, heart disease, and stroke. The DASH eating plan may also help with weight loss.  WHAT DO I NEED TO KNOW ABOUT THE DASH EATING PLAN?  For the DASH eating plan, you will follow these general guidelines:  · Choose foods with a percent daily value for sodium of less than 5% (as listed on the food label).  · Use salt-free seasonings or herbs instead of table salt or sea salt.  · Check with your health care provider or pharmacist before using salt substitutes.  · Eat lower-sodium products, often labeled as "lower sodium" or "no salt added."  · Eat fresh foods.  · Eat more vegetables, fruits, and low-fat dairy products.  · Choose whole grains. Look for the word "whole" as the first word in the ingredient list.  · Choose fish and skinless chicken or turkey more often than red meat. Limit fish, poultry, and meat to 6 oz (170 g) each day.  · Limit sweets, desserts, sugars, and sugary drinks.  · Choose heart-healthy fats.  · Limit cheese to 1 oz (28 g) per day.  · Eat more home-cooked food and less restaurant, buffet, and fast food.  · Limit fried foods.  · Cook foods using methods other than frying.  · Limit canned vegetables. If you do use them, rinse them well to decrease the sodium.  · When eating at a restaurant, ask that your food be prepared with less salt, or no salt if possible.  WHAT FOODS CAN I EAT?  Seek help from a dietitian for individual calorie needs.  Grains  Whole grain or whole wheat bread. Brown rice. Whole grain or whole wheat pasta. Quinoa, bulgur, and whole grain cereals. Low-sodium cereals. Corn or whole wheat flour tortillas. Whole grain cornbread. Whole grain crackers. Low-sodium crackers.  Vegetables  Fresh or frozen vegetables  (raw, steamed, roasted, or grilled). Low-sodium or reduced-sodium tomato and vegetable juices. Low-sodium or reduced-sodium tomato sauce and paste. Low-sodium or reduced-sodium canned vegetables.   Fruits  All fresh, canned (in natural juice), or frozen fruits.  Meat and Other Protein Products  Ground beef (85% or leaner), grass-fed beef, or beef trimmed of fat. Skinless chicken or turkey. Ground chicken or turkey. Pork trimmed of fat. All fish and seafood. Eggs. Dried beans, peas, or lentils. Unsalted nuts and seeds. Unsalted canned beans.  Dairy  Low-fat dairy products, such as skim or 1% milk, 2% or reduced-fat cheeses, low-fat ricotta or cottage cheese, or plain low-fat yogurt. Low-sodium or reduced-sodium cheeses.  Fats and Oils  Tub margarines without trans fats. Light or reduced-fat mayonnaise and salad dressings (reduced sodium). Avocado. Safflower, olive, or canola oils. Natural peanut or almond butter.  Other  Unsalted popcorn and pretzels.  The items listed above may not be a complete list of recommended foods or beverages. Contact your dietitian for more options.  WHAT FOODS ARE NOT RECOMMENDED?  Grains  White bread. White pasta. White rice. Refined cornbread. Bagels and croissants. Crackers that contain trans fat.  Vegetables  Creamed or fried vegetables. Vegetables in a cheese sauce. Regular canned vegetables. Regular canned tomato sauce and paste. Regular tomato and vegetable juices.  Fruits  Dried fruits. Canned fruit in light or heavy syrup. Fruit juice.  Meat and Other Protein   Products  Fatty cuts of meat. Ribs, chicken wings, bacon, sausage, bologna, salami, chitterlings, fatback, hot dogs, bratwurst, and packaged luncheon meats. Salted nuts and seeds. Canned beans with salt.  Dairy  Whole or 2% milk, cream, half-and-half, and cream cheese. Whole-fat or sweetened yogurt. Full-fat cheeses or blue cheese. Nondairy creamers and whipped toppings. Processed cheese, cheese spreads, or cheese  curds.  Condiments  Onion and garlic salt, seasoned salt, table salt, and sea salt. Canned and packaged gravies. Worcestershire sauce. Tartar sauce. Barbecue sauce. Teriyaki sauce. Soy sauce, including reduced sodium. Steak sauce. Fish sauce. Oyster sauce. Cocktail sauce. Horseradish. Ketchup and mustard. Meat flavorings and tenderizers. Bouillon cubes. Hot sauce. Tabasco sauce. Marinades. Taco seasonings. Relishes.  Fats and Oils  Butter, stick margarine, lard, shortening, ghee, and bacon fat. Coconut, palm kernel, or palm oils. Regular salad dressings.  Other  Pickles and olives. Salted popcorn and pretzels.  The items listed above may not be a complete list of foods and beverages to avoid. Contact your dietitian for more information.  WHERE CAN I FIND MORE INFORMATION?  National Heart, Lung, and Blood Institute: www.nhlbi.nih.gov/health/health-topics/topics/dash/     This information is not intended to replace advice given to you by your health care provider. Make sure you discuss any questions you have with your health care provider.     Document Released: 09/27/2011 Document Revised: 10/29/2014 Document Reviewed: 08/12/2013  Elsevier Interactive Patient Education ©2016 Elsevier Inc.

## 2016-03-22 NOTE — Progress Notes (Signed)
Pre visit review using our clinic review tool, if applicable. No additional management support is needed unless otherwise documented below in the visit note. 

## 2016-03-23 LAB — URINE CULTURE: Colony Count: 15000

## 2016-04-01 NOTE — Assessment & Plan Note (Signed)
Encouraged increased hydration, 64 ounces of clear fluids daily. Minimize alcohol and caffeine. Eat small frequent meals with lean proteins and complex carbs. Avoid high and low blood sugars. Get adequate sleep, 7-8 hours a night. Needs exercise daily preferably in the morning.

## 2016-04-01 NOTE — Assessment & Plan Note (Signed)
Well controlled, no changes to meds. Encouraged heart healthy diet such as the DASH diet and exercise as tolerated.  °

## 2016-04-01 NOTE — Assessment & Plan Note (Signed)
Encouraged DASH diet, decrease po intake and increase exercise as tolerated. Needs 7-8 hours of sleep nightly. Avoid trans fats, eat small, frequent meals every 4-5 hours with lean proteins, complex carbs and healthy fats. Minimize simple carbs 

## 2016-04-01 NOTE — Assessment & Plan Note (Signed)
Uses Buspar with ood results.

## 2016-04-01 NOTE — Progress Notes (Signed)
Patient ID: Francene Finders, female   DOB: June 21, 1976, 40 y.o.   MRN: [id number]   Subjective:    Patient ID: Francene Finders, female    DOB: 12/15/1975, 40 y.o.   MRN: [id number]  Chief Complaint  Patient presents with  . Follow-up    HPI Patient is in today for follow up. She is feeling much better. After prolonged treatment of sinusitis her headaches and disequilibrium have improved. No recent illness or acute concerns. Denies CP/palp/SOB/HA/congestion/fevers/GI or GU c/o. Taking meds as prescribed  Past Medical History  Diagnosis Date  . Chicken pox as a child  . Overweight(278.02)   . Headache(784.0) 11/22/2011  . Allergic state 11/22/2011  . Visual changes 11/22/2011  . Elevated BP 11/22/2011  . Anxiety 11/22/2011  . Sinusitis 11/22/2011  . Plantar fasciitis 01/11/2012  . Fatigue 01/11/2012  . Pharyngitis 01/11/2012  . Sleep apnea 04/03/2012    setting 2  . Chronic kidney disease     kidney stones  . Hypertension   . Kidney stone 12/21/2012  . Back pain 01/31/2013  . 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 Dehydration 01/31/2013  . Preventative health care 08/18/2013  . BPV (benign positional vertigo) 08/23/2013  . Other and unspecified hyperlipidemia 03/07/2014    Past Surgical History  Procedure Laterality Date  . Gum surgery    . Wisdom tooth extraction    . Perineal body repair      4 th degree tear with fistula between vagina and retum requiring 3 surgeries for correction  . Rectal fistula repair      x 2    Family History  Problem Relation Age of Onset  . Diabetes Mother     type 2  . Hypertension Mother   . Hyperlipidemia Mother   . Hypertension Father   . Hyperlipidemia Father   . Aneurysm Father     abdominal aorta  . Cancer Maternal Grandmother 82    colon, ovarian  . Other Maternal Grandfather 41    brain hemorrhage  . Stroke Maternal Grandfather   . Aneurysm Maternal Grandfather   . Heart attack Paternal Grandmother   . Heart disease Paternal  Grandmother     MI  . Alzheimer's disease Paternal Grandfather   . Dementia Paternal Grandfather   . Heart attack Paternal Aunt   . Heart disease Paternal Aunt      AAA rupture  . Other Paternal Uncle     cerebreal brain hemorrhage  . Stroke Paternal Uncle   . Aneurysm Paternal Uncle   . Asthma Daughter   . Other Daughter     peanut allergy  . Heart disease Maternal Aunt     s/p cabg. brain aneurysm s/p coiling  . Aneurysm Maternal Aunt     coil behind eye    Social History   Social History  . Marital Status: Married    Spouse Name: N/A  . Number of Children: N/A  . Years of Education: N/A   Occupational History  . Not on file.   Social History Main Topics  . Smoking status: Never Smoker   . Smokeless tobacco: Never Used  . Alcohol Use: No  . Drug Use: No  . Sexual Activity:    Partners: Male    Birth Control/ Protection: None     Comment: lives with husband, daughter, no dietary restrictions   Other Topics Concern  . Not on file   Social History Narrative    Outpatient Prescriptions Prior to Visit  Medication Sig Dispense Refill  . busPIRone (BUSPAR) 15 MG tablet Take 1 tablet (15 mg total) by mouth 2 (two) times daily. 60 tablet 5  . fluticasone (FLONASE) 50 MCG/ACT nasal spray Place 2 sprays into both nostrils daily. 16 g 6  . HYDROcodone-acetaminophen (NORCO/VICODIN) 5-325 MG tablet Take 1 tablet by mouth every 6 (six) hours as needed for moderate pain. 30 tablet 0  . omeprazole (PRILOSEC) 40 MG capsule Take 1 capsule (40 mg total) by mouth daily. 30 capsule 3  . ondansetron (ZOFRAN) 4 MG tablet Take 1 tablet (4 mg total) by mouth every 8 (eight) hours as needed for nausea or vomiting. 30 tablet 0  . topiramate (TOPAMAX) 50 MG tablet Take 1 tablet (50 mg total) by mouth 2 (two) times daily. 30 tablet 11  . amLODipine (NORVASC) 5 MG tablet Take 1 tablet (5 mg total) by mouth daily. 90 tablet 3   No facility-administered medications prior to visit.    No  Known Allergies  Review of Systems  Constitutional: Negative for fever and malaise/fatigue.  HENT: Negative for congestion.   Eyes: Negative for blurred vision.  Respiratory: Negative for shortness of breath.   Cardiovascular: Negative for chest pain, palpitations and leg swelling.  Gastrointestinal: Negative for nausea, abdominal pain and blood in stool.  Genitourinary: Negative for dysuria and frequency.  Musculoskeletal: Positive for back pain. Negative for falls.  Skin: Negative for rash.  Neurological: Negative for dizziness, loss of consciousness and headaches.  Endo/Heme/Allergies: Negative for environmental allergies.  Psychiatric/Behavioral: Negative for depression. The patient is not nervous/anxious.        Objective:    Physical Exam  Constitutional: She is oriented to person, place, and time. She appears well-developed and well-nourished. No distress.  HENT:  Head: Normocephalic and atraumatic.  Nose: Nose normal.  Eyes: Right eye exhibits no discharge. Left eye exhibits no discharge.  Neck: Normal range of motion. Neck supple.  Cardiovascular: Normal rate and regular rhythm.   No murmur heard. Pulmonary/Chest: Effort normal and breath sounds normal.  Abdominal: Soft. Bowel sounds are normal. There is no tenderness.  Musculoskeletal: She exhibits no edema.  Neurological: She is alert and oriented to person, place, and time.  Skin: Skin is warm and dry.  Psychiatric: She has a normal mood and affect.  Nursing note and vitals reviewed.   BP 120/80 mmHg  Pulse 76  Temp(Src) 98.2 F (36.8 C) (Oral)  Ht 5' 5"  (1.651 m)  Wt 231 lb 2 oz (104.838 kg)  BMI 38.46 kg/m2  SpO2 98% Wt Readings from Last 3 Encounters:  03/22/16 231 lb 2 oz (104.838 kg)  03/01/16 231 lb (104.781 kg)  02/16/16 231 lb 6 oz (104.951 kg)     Lab Results  Component Value Date   WBC 14.3* 03/22/2016   HGB 14.2 03/22/2016   HCT 42.0 03/22/2016   PLT 304.0 03/22/2016   GLUCOSE 89  03/22/2016   CHOL 199 09/22/2015   TRIG 109.0 09/22/2015   HDL 37.70* 09/22/2015   LDLCALC 139* 09/22/2015   ALT 48* 03/22/2016   AST 35 03/22/2016   NA 136 03/22/2016   K 3.9 03/22/2016   CL 103 03/22/2016   CREATININE 0.69 03/22/2016   BUN 12 03/22/2016   CO2 30 03/22/2016   TSH 1.05 12/22/2015    Lab Results  Component Value Date   TSH 1.05 12/22/2015   Lab Results  Component Value Date   WBC 14.3* 03/22/2016   HGB 14.2 03/22/2016  HCT 42.0 03/22/2016   MCV 90.9 03/22/2016   PLT 304.0 03/22/2016   Lab Results  Component Value Date   NA 136 03/22/2016   K 3.9 03/22/2016   CO2 30 03/22/2016   GLUCOSE 89 03/22/2016   BUN 12 03/22/2016   CREATININE 0.69 03/22/2016   BILITOT 0.4 03/22/2016   ALKPHOS 71 03/22/2016   AST 35 03/22/2016   ALT 48* 03/22/2016   PROT 7.9 03/22/2016   ALBUMIN 4.1 03/22/2016   CALCIUM 9.6 03/22/2016   ANIONGAP 4* 11/26/2014   GFR 99.37 03/22/2016   Lab Results  Component Value Date   CHOL 199 09/22/2015   Lab Results  Component Value Date   HDL 37.70* 09/22/2015   Lab Results  Component Value Date   LDLCALC 139* 09/22/2015   Lab Results  Component Value Date   TRIG 109.0 09/22/2015   Lab Results  Component Value Date   CHOLHDL 5 09/22/2015   No results found for: HGBA1C     Assessment & Plan:   Problem List Items Addressed This Visit    Overweight    Encouraged DASH diet, decrease po intake and increase exercise as tolerated. Needs 7-8 hours of sleep nightly. Avoid trans fats, eat small, frequent meals every 4-5 hours with lean proteins, complex carbs and healthy fats. Minimize simple carbs      Maxillary sinusitis, acute    Symptoms improved with treatment, no significant headaches since treatment      Hyperlipidemia, mixed    Encouraged heart healthy diet, increase exercise, avoid trans fats, consider a krill oil cap daily      Relevant Medications   amLODipine (NORVASC) 5 MG tablet   Headache     Encouraged increased hydration, 64 ounces of clear fluids daily. Minimize alcohol and caffeine. Eat small frequent meals with lean proteins and complex carbs. Avoid high and low blood sugars. Get adequate sleep, 7-8 hours a night. Needs exercise daily preferably in the morning.      Relevant Medications   amLODipine (NORVASC) 5 MG tablet   Essential hypertension    Well controlled, no changes to meds. Encouraged heart healthy diet such as the DASH diet and exercise as tolerated.       Relevant Medications   amLODipine (NORVASC) 5 MG tablet   Other Relevant Orders   Urinalysis (Completed)   Urine culture (Completed)   CBC w/Diff (Completed)   Comp Met (CMET) (Completed)   RESOLVED: Dehydration   Anxiety    Uses Buspar with ood results.        Other Visit Diagnoses    Hematuria    -  Primary    Relevant Orders    Urinalysis (Completed)    Urine culture (Completed)    CBC w/Diff (Completed)    Comp Met (CMET) (Completed)    Renal lithiasis        Relevant Orders    Urinalysis (Completed)    Urine culture (Completed)    CBC w/Diff (Completed)    Comp Met (CMET) (Completed)       I am having Ms. Hackley maintain her fluticasone, omeprazole, HYDROcodone-acetaminophen, ondansetron, busPIRone, topiramate, and amLODipine.  Meds ordered this encounter  Medications  . amLODipine (NORVASC) 5 MG tablet    Sig: Take 1 tablet (5 mg total) by mouth daily.    Dispense:  90 tablet    Refill:  3     Penni Homans, MD

## 2016-04-01 NOTE — Assessment & Plan Note (Signed)
Symptoms improved with treatment, no significant headaches since treatment

## 2016-04-01 NOTE — Assessment & Plan Note (Signed)
Encouraged heart healthy diet, increase exercise, avoid trans fats, consider a krill oil cap daily 

## 2016-04-09 ENCOUNTER — Other Ambulatory Visit: Payer: Self-pay | Admitting: Family Medicine

## 2016-04-09 DIAGNOSIS — Z1231 Encounter for screening mammogram for malignant neoplasm of breast: Secondary | ICD-10-CM

## 2016-04-11 ENCOUNTER — Telehealth: Payer: Self-pay | Admitting: Family Medicine

## 2016-04-11 NOTE — Telephone Encounter (Signed)
OK to transfer

## 2016-04-11 NOTE — Telephone Encounter (Signed)
Pt asking to trans care to Dr Birdie Riddle due to being closer to home, Please advise if ok to schedule

## 2016-04-11 NOTE — Telephone Encounter (Signed)
Ok to transfer. 

## 2016-04-11 NOTE — Telephone Encounter (Signed)
LM for pt to call back to schedule an appt.

## 2016-04-12 NOTE — Telephone Encounter (Signed)
Patient scheduled.

## 2016-05-02 ENCOUNTER — Ambulatory Visit
Admission: RE | Admit: 2016-05-02 | Discharge: 2016-05-02 | Disposition: A | Discharge status: Home / Self Care | Payer: Managed Care, Other (non HMO) | Source: Ambulatory Visit | Attending: Family Medicine | Admitting: Family Medicine

## 2016-05-02 DIAGNOSIS — Z1231 Encounter for screening mammogram for malignant neoplasm of breast: Secondary | ICD-10-CM

## 2016-07-05 ENCOUNTER — Ambulatory Visit (INDEPENDENT_AMBULATORY_CARE_PROVIDER_SITE_OTHER): Payer: Managed Care, Other (non HMO) | Admitting: Neurology

## 2016-07-05 ENCOUNTER — Encounter: Payer: Self-pay | Admitting: Neurology

## 2016-07-05 VITALS — BP 154/82 | HR 78 | Resp 16 | Ht 65.0 in | Wt 231.5 lb

## 2016-07-05 DIAGNOSIS — F419 Anxiety disorder, unspecified: Secondary | ICD-10-CM | POA: Diagnosis not present

## 2016-07-05 DIAGNOSIS — G4730 Sleep apnea, unspecified: Secondary | ICD-10-CM | POA: Diagnosis not present

## 2016-07-05 DIAGNOSIS — G4489 Other headache syndrome: Secondary | ICD-10-CM | POA: Diagnosis not present

## 2016-07-05 NOTE — Progress Notes (Signed)
[facility]  PATIENT: Erika Weaver DOB: 04/18/1976  REFERRING DOCTOR OR PCP:  Penni Homans SOURCE/Data [place]: Patient, records in EMR, lab results, MRI results, MRI images on PACS.  _________________________________   HISTORICAL  CHIEF COMPLAINT:  Chief Complaint  Patient presents with  . Dizziness    Sts. 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 The only med she is consistently taking right now is Norvasc.  She would like to discuss other meds/fim    HISTORY OF PRESENT ILLNESS:   Erika Weaver is a 40 year old woman with intermittent spells of lightheadedness , anxiety, headaches and OSA  Spells of lightheadedness:  She did much better over the summer but is starting to get more spells of lightheadedness.  Current episodes are intermittent and occur 4-5 times a day lasting 1-2 hours at a time.   She feels lightheaded but not vertiginous.    No change in hearing.     Lightheadednes can occur in any position.   In the past, her episodes would last several hours to a day but would resolve.   MRI is essentially normal with just mild ethmoid sinusitis.   She was evaluated by ENT and no source or her symptoms were found. She denies any change in hearing.  HA:   She feels headache is better in general.  When present it is usually mild to moderate.   Meclizine has eased it when more severe.    She has had headaches off and on for the past 5 or 6 years as well.   The headache is in the entire head and is nagging more than painful at this point.   OSA:   She is currently getting 8 hours/sleep a night.   Over the summer she was sleeping 10 hours.   She also has obstructive sleep apnea and uses CPAP nightly now.  She falls asleep easily and stays asleep fairly well. She denies any insomnia. She denies excessive daytime sleepiness, though there was not  much prior to the CPAP either.       Anxiety:   She denies depression. She notes mild anxiety and stress.,   Buspar has helped the anxiety some (takes only prn).    REVIEW OF SYSTEMS: Constitutional: No fevers, chills, sweats, or change in appetite Eyes: No visual changes, double vision, eye pain Ear, nose and throat: No hearing loss, ear pain, nasal congestion, sore throat Cardiovascular: No chest pain, palpitations Respiratory: No shortness of breath at rest or with exertion.   No wheezes.  On CPAP for OSA GastrointestinaI: No nausea, vomiting, diarrhea, abdominal pain, fecal incontinence Genitourinary: No dysuria, urinary retention or frequency.  No nocturia. Musculoskeletal: No neck pain, back pain Integumentary: No rash, pruritus, skin lesions Neurological: as above Psychiatric: No depression at this time.  Notes mild anxiety and stress at times Endocrine: No palpitations, diaphoresis, change in appetite, change in weigh or increased thirst Hematologic/Lymphatic: No anemia, purpura, petechiae. Allergic/Immunologic: No itchy/runny eyes, nasal congestion, recent allergic reactions, rashes  ALLERGIES: No Known Allergies  HOME MEDICATIONS:  Current Outpatient Prescriptions:  .  amLODipine (NORVASC) 5 MG tablet, Take 1 tablet (5 mg total) by mouth daily., Disp: 90 tablet, Rfl: 3 .  busPIRone (BUSPAR) 15 MG tablet, Take 1 tablet (15 mg total) by mouth 2 (two) times daily. (Patient not taking: Reported on 07/05/2016), Disp: 60 tablet, Rfl: 5 .  fluticasone (FLONASE) 50  MCG/ACT nasal spray, Place 2 sprays into both nostrils daily. (Patient not taking: Reported on 07/05/2016), Disp: 16 g, Rfl: 6 .  omeprazole (PRILOSEC) 40 MG capsule, Take 1 capsule (40 mg total) by mouth daily. (Patient not taking: Reported on 07/05/2016), Disp: 30 capsule, Rfl: 3 .  topiramate (TOPAMAX) 50 MG tablet, Take 1 tablet (50 mg total) by mouth 2 (two) times daily. (Patient not taking: Reported on 07/05/2016),  Disp: 30 tablet, Rfl: 11  PAST MEDICAL HISTORY: Past Medical History:  Diagnosis Date  . Allergic state 11/22/2011  . Anxiety 11/22/2011  . Back pain 01/31/2013  . BPV (benign positional vertigo) 08/23/2013  . Chicken pox as a child  . Chronic kidney disease    kidney stones  . Dehydration 01/31/2013  . Elevated BP 11/22/2011  . Fatigue 01/11/2012  . Headache(784.0) 11/22/2011  . Hypertension   . Kidney stone 12/21/2012  . Other and unspecified hyperlipidemia 03/07/2014  . Overweight(278.02)   . Pharyngitis 01/11/2012  . Plantar fasciitis 01/11/2012  . Preventative health care 08/18/2013  . 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 Sinusitis 11/22/2011  . Sleep apnea 04/03/2012   setting 2  . Visual changes 11/22/2011    PAST SURGICAL HISTORY: Past Surgical History:  Procedure Laterality Date  . GUM SURGERY    . PERINEAL BODY REPAIR     4 th degree tear with fistula between vagina and retum requiring 3 surgeries for correction  . rectal fistula repair     x 2  . WISDOM TOOTH EXTRACTION      FAMILY HISTORY: Family History  Problem Relation Age of Onset  . Diabetes Mother     type 2  . Hypertension Mother   . Hyperlipidemia Mother   . Hypertension Father   . Hyperlipidemia Father   . Aneurysm Father     abdominal aorta  . Cancer Maternal Grandmother 82    colon, ovarian  . Other Maternal Grandfather 41    brain hemorrhage  . Stroke Maternal Grandfather   . Aneurysm Maternal Grandfather   . Heart attack Paternal Grandmother   . Heart disease Paternal Grandmother     MI  . Alzheimer's disease Paternal Grandfather   . Dementia Paternal Grandfather   . Heart attack Paternal Aunt   . Heart disease Paternal Aunt      AAA rupture  . Other Paternal Uncle     cerebreal brain hemorrhage  . Stroke Paternal Uncle   . Aneurysm Paternal Uncle   . Asthma Daughter   . Other Daughter     peanut allergy  . Heart disease Maternal Aunt     s/p cabg. brain aneurysm s/p coiling  .  Aneurysm Maternal Aunt     coil behind eye    SOCIAL HISTORY:  Social History   Social History  . Marital status: Married    Spouse name: N/A  . Number of children: N/A  . Years of education: N/A   Occupational History  . Not on file.   Social History Main Topics  . Smoking status: Never Smoker  . Smokeless tobacco: Never Used  . Alcohol use No  . Drug use: No  . Sexual activity: Yes    Partners: Male    Birth control/ protection: None     Comment: lives with husband, daughter, no dietary restrictions   Other Topics Concern  . Not on file   Social History Narrative  . No narrative on file     PHYSICAL EXAM  Vitals:  07/05/16 0932  BP: (!) 154/82  Pulse: 78  Resp: 16  Weight: 231 lb 8 oz (105 kg)  Height: 5' 5"  (1.651 m)   No data found.    Body mass index is 38.52 kg/m.   General: The patient is well-developed and well-nourished and in no acute distress   Neurologic Exam  Mental status: The patient is alert and oriented x 3 at the time of the examination. The patient has apparent normal recent and remote memory, with an apparently normal attention span and concentration ability.   Speech is normal.  Cranial nerves: Extraocular movements are full.  There is good facial sensation to soft touch bilaterally.Facial strength is normal.  Trapezius and sternocleidomastoid strength is normal. No dysarthria is noted.  The tongue is midline, and the patient has symmetric elevation of the soft palate. No obvious hearing deficits are noted.  Motor:  Muscle bulk is normal.   Tone is normal. Strength is  5 / 5 in all 4 extremities.   Sensory: Sensory testing is intact to soft touch and vibration sensation in all 4 extremities.  Coordination: Cerebellar testing reveals good finger-nose-fingerbilaterally.  Gait and station: Station is normal.   Gait is normal. Tandem gait is normal.   Reflexes: Deep tendon reflexes are symmetric and normal bilaterally.        DIAGNOSTIC DATA (LABS, IMAGING, TESTING) - I reviewed patient records, labs, notes, testing and imaging myself where available.  Lab Results  Component Value Date   WBC 14.3 (H) 03/22/2016   HGB 14.2 03/22/2016   HCT 42.0 03/22/2016   MCV 90.9 03/22/2016   PLT 304.0 03/22/2016      Component Value Date/Time   NA 136 03/22/2016 1117   K 3.9 03/22/2016 1117   CL 103 03/22/2016 1117   CO2 30 03/22/2016 1117   GLUCOSE 89 03/22/2016 1117   BUN 12 03/22/2016 1117   CREATININE 0.69 03/22/2016 1117   CREATININE 0.72 05/27/2014 0944   CALCIUM 9.6 03/22/2016 1117   PROT 7.9 03/22/2016 1117   ALBUMIN 4.1 03/22/2016 1117   AST 35 03/22/2016 1117   ALT 48 (H) 03/22/2016 1117   ALKPHOS 71 03/22/2016 1117   BILITOT 0.4 03/22/2016 1117   GFRNONAA >90 11/26/2014 1030   GFRAA >90 11/26/2014 1030   Lab Results  Component Value Date   CHOL 199 09/22/2015   HDL 37.70 (L) 09/22/2015   LDLCALC 139 (H) 09/22/2015   TRIG 109.0 09/22/2015   CHOLHDL 5 09/22/2015   No results found for: HGBA1C No results found for: VITAMINB12 Lab Results  Component Value Date   TSH 1.05 12/22/2015       ASSESSMENT AND PLAN  Other headache syndrome  Anxiety  Sleep apnea     1.   Take BuSpar 15 mg po bid regularly. 2.   If headaches or vertigo episodes worsen, resume topiramate  3.   Continue to use CPAP nightly. Try to get 8 hours of sleep if possible. She will return to see me in 4-5 months or sooner if there are new or worsening neurologic symptoms.   Richard A. Felecia Shelling, MD, PhD 07/14/3006, 6:22 AM Certified in Neurology, Clinical Neurophysiology, Sleep Medicine, Pain Medicine and Neuroimaging  [facility] [address] [place], [place] [address] [phone]

## 2016-08-30 ENCOUNTER — Ambulatory Visit (INDEPENDENT_AMBULATORY_CARE_PROVIDER_SITE_OTHER): Payer: Managed Care, Other (non HMO) | Admitting: Family Medicine

## 2016-08-30 ENCOUNTER — Encounter: Payer: Self-pay | Admitting: Family Medicine

## 2016-08-30 VITALS — BP 138/84 | HR 88 | Temp 98.1°F | Resp 16 | Ht 65.0 in | Wt 224.4 lb

## 2016-08-30 DIAGNOSIS — Z0000 Encounter for general adult medical examination without abnormal findings: Secondary | ICD-10-CM

## 2016-08-30 DIAGNOSIS — R109 Unspecified abdominal pain: Secondary | ICD-10-CM | POA: Diagnosis not present

## 2016-08-30 DIAGNOSIS — Z23 Encounter for immunization: Secondary | ICD-10-CM

## 2016-08-30 LAB — BASIC METABOLIC PANEL
BUN: 11 mg/dL (ref 6–23)
CALCIUM: 9.7 mg/dL (ref 8.4–10.5)
CO2: 29 mEq/L (ref 19–32)
Chloride: 104 mEq/L (ref 96–112)
Creatinine, Ser: 0.7 mg/dL (ref 0.40–1.20)
GFR: 97.53 mL/min (ref >60.00)
GLUCOSE: 86 mg/dL (ref 70–99)
POTASSIUM: 4.2 meq/L (ref 3.5–5.1)
SODIUM: 139 meq/L (ref 135–145)

## 2016-08-30 LAB — HEPATIC FUNCTION PANEL
ALBUMIN: 4 g/dL (ref 3.5–5.2)
ALK PHOS: 81 U/L (ref 39–117)
ALT: 30 U/L (ref 0–35)
AST: 20 U/L (ref 0–37)
BILIRUBIN DIRECT: 0.1 mg/dL (ref 0.0–0.3)
TOTAL PROTEIN: 7.5 g/dL (ref 6.0–8.3)
Total Bilirubin: 0.7 mg/dL (ref 0.2–1.2)

## 2016-08-30 LAB — CBC WITH DIFFERENTIAL/PLATELET
BASOS ABS: 0.1 10*3/uL (ref 0.0–0.1)
Basophils Relative: 0.5 % (ref 0.0–3.0)
EOS PCT: 1.4 % (ref 0.0–5.0)
Eosinophils Absolute: 0.2 10*3/uL (ref 0.0–0.7)
HEMATOCRIT: 42.7 % (ref 36.0–46.0)
Hemoglobin: 14.5 g/dL (ref 12.0–15.0)
LYMPHS PCT: 24.8 % (ref 12.0–46.0)
Lymphs Abs: 3.4 10*3/uL (ref 0.7–4.0)
MCHC: 34 g/dL (ref 30.0–36.0)
MCV: 91.8 fl (ref 78.0–100.0)
MONOS PCT: 5.9 % (ref 3.0–12.0)
Monocytes Absolute: 0.8 10*3/uL (ref 0.1–1.0)
Neutro Abs: 9.2 10*3/uL — ABNORMAL HIGH (ref 1.4–7.7)
Neutrophils Relative %: 67.4 % (ref 43.0–77.0)
Platelets: 299 10*3/uL (ref 150.0–400.0)
RBC: 4.65 Mil/uL (ref 3.87–5.11)
RDW: 12.8 % (ref 11.5–15.5)
WBC: 13.6 10*3/uL — ABNORMAL HIGH (ref 4.0–10.5)

## 2016-08-30 LAB — POCT URINALYSIS DIPSTICK
BILIRUBIN UA: NEGATIVE
GLUCOSE UA: NEGATIVE
KETONES UA: NEGATIVE
Leukocytes, UA: NEGATIVE
NITRITE UA: NEGATIVE
Protein, UA: NEGATIVE
RBC UA: NEGATIVE
SPEC GRAV UA: 1.025
UROBILINOGEN UA: 0.2
pH, UA: 6

## 2016-08-30 LAB — LIPID PANEL
CHOL/HDL RATIO: 5
CHOLESTEROL: 215 mg/dL — AB (ref 0–200)
HDL: 45.8 mg/dL (ref >39.00)
LDL CALC: 147 mg/dL — AB (ref 0–99)
NonHDL: 168.85
Triglycerides: 111 mg/dL (ref 0.0–149.0)
VLDL: 22.2 mg/dL (ref 0.0–40.0)

## 2016-08-30 LAB — TSH: TSH: 1.43 u[IU]/mL (ref 0.35–4.50)

## 2016-08-30 LAB — VITAMIN D 25 HYDROXY (VIT D DEFICIENCY, FRACTURES): VITD: 14.74 ng/mL — AB (ref 30.00–100.00)

## 2016-08-30 MED ORDER — MELOXICAM 15 MG PO TABS: 15.0000 mg | ORAL_TABLET | Freq: Every day | ORAL | 1 refills | Status: DC

## 2016-08-30 NOTE — Assessment & Plan Note (Signed)
Pt's PE WNL w/ exception of obesity.  UTD on GYN.  Check labs.  Flu shot given.  Anticipatory guidance provided.

## 2016-08-30 NOTE — Progress Notes (Signed)
Pre visit review using our clinic review tool, if applicable. No additional management support is needed unless otherwise documented below in the visit note. 

## 2016-08-30 NOTE — Patient Instructions (Signed)
Follow up in 6 months to recheck BP We'll notify you of your lab results and make any changes if needed Continue to work on healthy diet and regular exercise- you can do it!! Take the Mobic once daily- take w/ food x7 days and then as needed for back pain Call with any questions or concerns Happy [place] Birthday!!!

## 2016-08-30 NOTE — Progress Notes (Signed)
   Subjective:    Patient ID: Erika Weaver, female    DOB: 06/13/1976, 40 y.o.   MRN: [id number]  HPI PE- UTD on mammo and pap (Dr Matthew Saras).  Pt has lost 8 lbs since 9/14.   Review of Systems Patient reports no vision/ hearing changes, adenopathy,fever, weight change,  persistant/recurrent hoarseness , swallowing issues, chest pain, palpitations, edema, persistant/recurrent cough, hemoptysis, dyspnea (rest/exertional/paroxysmal nocturnal), gastrointestinal bleeding (melena, rectal bleeding), abdominal pain, significant heartburn, bowel changes, GU symptoms (dysuria, hematuria, incontinence), Gyn symptoms (abnormal  bleeding, pain),  syncope, focal weakness, memory loss, numbness & tingling, skin/hair/nail changes, abnormal bruising or bleeding, anxiety, or depression.     Objective:   Physical Exam General Appearance:    Alert, cooperative, no distress, appears stated age, obese  Head:    Normocephalic, without obvious abnormality, atraumatic  Eyes:    PERRL, conjunctiva/corneas clear, EOM's intact, fundi    benign, both eyes  Ears:    Normal TM's and external ear canals, both ears  Nose:   Nares normal, septum midline, mucosa normal, no drainage    or sinus tenderness  Throat:   Lips, mucosa, and tongue normal; teeth and gums normal  Neck:   Supple, symmetrical, trachea midline, no adenopathy;    Thyroid: no enlargement/tenderness/nodules  Back:     Symmetric, no curvature, ROM normal, no CVA tenderness  Lungs:     Clear to auscultation bilaterally, respirations unlabored  Chest Wall:    No tenderness or deformity   Heart:    Regular rate and rhythm, S1 and S2 normal, no murmur, rub   or gallop  Breast Exam:    Deferred to GYN  Abdomen:     Soft, non-tender, bowel sounds active all four quadrants,    no masses, no organomegaly  Genitalia:    Deferred to GYN  Rectal:    Extremities:   Extremities normal, atraumatic, no cyanosis or edema  Pulses:   2+ and symmetric all  extremities  Skin:   Skin color, texture, turgor normal, no rashes or lesions  Lymph nodes:   Cervical, supraclavicular, and axillary nodes normal  Neurologic:   CNII-XII intact, normal strength, sensation and reflexes    throughout          Assessment & Plan:

## 2016-08-31 ENCOUNTER — Other Ambulatory Visit: Payer: Self-pay | Admitting: General Practice

## 2016-08-31 DIAGNOSIS — D729 Disorder of white blood cells, unspecified: Secondary | ICD-10-CM

## 2016-08-31 MED ORDER — VITAMIN D (ERGOCALCIFEROL) 1.25 MG (50000 UNIT) PO CAPS: ORAL_CAPSULE | ORAL | 0 refills | Status: DC

## 2016-09-20 ENCOUNTER — Ambulatory Visit: Payer: Managed Care, Other (non HMO) | Admitting: Family Medicine

## 2016-09-26 ENCOUNTER — Telehealth: Payer: Self-pay

## 2016-09-26 NOTE — Telephone Encounter (Signed)
Spoke with patient regarding vision. Patient reports blurred vision on and off x 2 weeks accompanied by light headache occasionally. Denies loss of vision. Patient called optometrist for appointment, but was instructed to see PCP first to rule out blood pressure/medication problems. Patient states she has 6 year history of lightheadedness, with 4 negative MRI's, followed by neuro. Patient is able to continue activities of daily living including driving and working without difficulty. Patient advised to report to Emergency Department with loss of vision or worsening symptoms, otherwise plan to see Elyn Aquas, PA-C on 12/7 at 0800. Patient verbalized understanding, also requesting labs to be collected at visit for Vitamin D and CBC.

## 2016-09-27 ENCOUNTER — Encounter: Payer: Self-pay | Admitting: Physician Assistant

## 2016-09-27 ENCOUNTER — Ambulatory Visit (INDEPENDENT_AMBULATORY_CARE_PROVIDER_SITE_OTHER): Payer: Managed Care, Other (non HMO) | Admitting: Physician Assistant

## 2016-09-27 ENCOUNTER — Other Ambulatory Visit: Payer: Managed Care, Other (non HMO)

## 2016-09-27 VITALS — BP 130/86 | HR 78 | Temp 98.5°F | Resp 14 | Ht 65.0 in | Wt 223.0 lb

## 2016-09-27 DIAGNOSIS — H539 Unspecified visual disturbance: Secondary | ICD-10-CM

## 2016-09-27 DIAGNOSIS — D729 Disorder of white blood cells, unspecified: Secondary | ICD-10-CM | POA: Diagnosis not present

## 2016-09-27 LAB — CBC WITH DIFFERENTIAL/PLATELET
BASOS ABS: 0.1 10*3/uL (ref 0.0–0.1)
Basophils Relative: 0.4 % (ref 0.0–3.0)
EOS ABS: 0.2 10*3/uL (ref 0.0–0.7)
Eosinophils Relative: 1.2 % (ref 0.0–5.0)
HEMATOCRIT: 43.5 % (ref 36.0–46.0)
HEMOGLOBIN: 14.6 g/dL (ref 12.0–15.0)
LYMPHS PCT: 20.8 % (ref 12.0–46.0)
Lymphs Abs: 2.8 10*3/uL (ref 0.7–4.0)
MCHC: 33.6 g/dL (ref 30.0–36.0)
MCV: 92.3 fl (ref 78.0–100.0)
MONO ABS: 0.8 10*3/uL (ref 0.1–1.0)
Monocytes Relative: 5.8 % (ref 3.0–12.0)
Neutro Abs: 9.6 10*3/uL — ABNORMAL HIGH (ref 1.4–7.7)
Neutrophils Relative %: 71.8 % (ref 43.0–77.0)
Platelets: 307 10*3/uL (ref 150.0–400.0)
RBC: 4.71 Mil/uL (ref 3.87–5.11)
RDW: 12.4 % (ref 11.5–15.5)
WBC: 13.3 10*3/uL — AB (ref 4.0–10.5)

## 2016-09-27 NOTE — Progress Notes (Signed)
Pre visit review using our clinic review tool, if applicable. No additional management support is needed unless otherwise documented below in the visit note. 

## 2016-09-27 NOTE — Patient Instructions (Signed)
Please continue chronic medications as directed. Stay well hydrated. Follow the dietary recommendations below.   Please use your home BP cuff to check BP any time you are symptomatic so we can get a better idea of whether or not there is a BP component to symptoms.   I want you to have an assessment by your Ophthalmologist. Please call Dr. Sherral Hammers to schedule an appointment. If you have any issue, please let us know so we can help to facilitate this appointment.   If this workup is negative and BP are remaining stable, I would question you are having ocular migraine/migraine variant symptoms that your Neurologist Felecia Shelling) can help [place] with.   Please keep [place] updated on your appointments and symptoms.  If any new symptoms develop, please come and see [place]. If you have any significant lightheadedness, dizziness or acute vision change, please go to the ER.

## 2016-09-27 NOTE — Progress Notes (Signed)
Patient presents to clinic today c/o intermittent episodes of slight blurring of vision bilaterally. Episodes started 1.5-2 weeks ago per patient. Happen usually at least once per day. Are lasting 30 minutes or so before resolving. Denies any lightheadedness or dizziness associated with these episodes. Patient does have chronic, intermittent dizziness, with extensive negative workup (ENT, Cardiology, Neurology with 4 MRIs per patient). Is currently followed by Dr. Felecia Shelling. Patient does sometimes note a very slight headache during some of these episodes that is bilateral. Denies aura, nausea, photophobia or phonophobia. Patient states headaches last a couple of hours before dissipating on its own. Patient has history of hypertension, currently on amlodipine daily. Endorses taking as directed. Is checking BP at home and averaging 130-140/80s. Has never checked BP during episodes of vision changes.   Past Medical History:  Diagnosis Date  . Allergic state 11/22/2011  . Anxiety 11/22/2011  . Back pain 01/31/2013  . BPV (benign positional vertigo) 08/23/2013  . Chicken pox as a child  . Chronic kidney disease    kidney stones  . Dehydration 01/31/2013  . Elevated BP 11/22/2011  . Fatigue 01/11/2012  . Headache(784.0) 11/22/2011  . Hypertension   . Kidney stone 12/21/2012  . Other and unspecified hyperlipidemia 03/07/2014  . Overweight(278.02)   . Pharyngitis 01/11/2012  . Plantar fasciitis 01/11/2012  . Preventative health care 08/18/2013  . 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 Sinusitis 11/22/2011  . Sleep apnea 04/03/2012   setting 2  . Visual changes 11/22/2011    Current Outpatient Prescriptions on File Prior to Visit  Medication Sig Dispense Refill  . amLODipine (NORVASC) 5 MG tablet Take 1 tablet (5 mg total) by mouth daily. 90 tablet 3  . fluticasone (FLONASE) 50 MCG/ACT nasal spray Place 2 sprays into both nostrils daily. 16 g 6  . meloxicam (MOBIC) 15 MG tablet Take 1 tablet (15 mg total) by  mouth daily. 30 tablet 1  . Vitamin D, Ergocalciferol, (DRISDOL) 50000 units CAPS capsule Take 1 capsule by mouth weekly. 12 capsule 0  . busPIRone (BUSPAR) 15 MG tablet Take 1 tablet (15 mg total) by mouth 2 (two) times daily. (Patient not taking: Reported on 09/27/2016) 60 tablet 5   No current facility-administered medications on file prior to visit.     No Known Allergies  Family History  Problem Relation Age of Onset  . Diabetes Mother     type 2  . Hypertension Mother   . Hyperlipidemia Mother   . Hypertension Father   . Hyperlipidemia Father   . Aneurysm Father     abdominal aorta  . Cancer Maternal Grandmother 82    colon, ovarian  . Other Maternal Grandfather 41    brain hemorrhage  . Stroke Maternal Grandfather   . Aneurysm Maternal Grandfather   . Heart attack Paternal Grandmother   . Heart disease Paternal Grandmother     MI  . Alzheimer's disease Paternal Grandfather   . Dementia Paternal Grandfather   . Heart attack Paternal Aunt   . Heart disease Paternal Aunt      AAA rupture  . Other Paternal Uncle     cerebreal brain hemorrhage  . Stroke Paternal Uncle   . Aneurysm Paternal Uncle   . Asthma Daughter   . Other Daughter     peanut allergy  . Heart disease Maternal Aunt     s/p cabg. brain aneurysm s/p coiling  . Aneurysm Maternal Aunt     coil behind eye    Social  History   Social History  . Marital status: Married    Spouse name: N/A  . Number of children: N/A  . Years of education: N/A   Social History Main Topics  . Smoking status: Never Smoker  . Smokeless tobacco: Never Used  . Alcohol use No  . Drug use: No  . Sexual activity: Yes    Partners: Male    Birth control/ protection: None     Comment: lives with husband, daughter, no dietary restrictions   Other Topics Concern  . None   Social History Narrative  . None   Review of Systems - See HPI.  All other ROS are negative.  There were no vitals taken for this  visit.  Physical Exam  Constitutional: She is oriented to person, place, and time and well-developed, well-nourished, and in no distress.  HENT:  Head: Normocephalic and atraumatic.  Mouth/Throat: Oropharynx is clear and moist.  Eyes: Right eye visual fields normal and left eye visual fields normal. Conjunctivae and EOM are normal. Pupils are equal, round, and reactive to light.  Neck: Neck supple.  Cardiovascular: Normal rate, regular rhythm, normal heart sounds and intact distal pulses.   Pulmonary/Chest: Effort normal and breath sounds normal. No respiratory distress. She has no wheezes. She has no rales. She exhibits no tenderness.  Neurological: She is alert and oriented to person, place, and time.  Skin: Skin is warm and dry. No rash noted.  Psychiatric: Affect normal.  Vitals reviewed.  Recent Results (from the past 2160 hour(s))  POCT urinalysis dipstick     Status: Normal   Collection Time: 08/30/16 10:06 AM  Result Value Ref Range   Color, UA yellow    Clarity, UA clear    Glucose, UA negative    Bilirubin, UA negative    Ketones, UA negative    Spec Grav, UA 1.025    Blood, UA negative    pH, UA 6.0    Protein, UA negative    Urobilinogen, UA 0.2    Nitrite, UA negative    Leukocytes, UA Negative Negative  Lipid panel     Status: Abnormal   Collection Time: 08/30/16 10:49 AM  Result Value Ref Range   Cholesterol 215 (H) 0 - 200 mg/dL    Comment: ATP III Classification       Desirable:  < 200 mg/dL               Borderline High:  200 - 239 mg/dL          High:  > = 240 mg/dL   Triglycerides 111.0 0.0 - 149.0 mg/dL    Comment: Normal:  <150 mg/dLBorderline High:  150 - 199 mg/dL   HDL 45.80 >39.00 mg/dL   VLDL 22.2 0.0 - 40.0 mg/dL   LDL Cholesterol 147 (H) 0 - 99 mg/dL   Total CHOL/HDL Ratio 5     Comment:                Men          Women1/2 Average Risk     3.4          3.3Average Risk          5.0          4.42X Average Risk          9.6          7.13X Average  Risk          15.0  11.0                       NonHDL 168.85     Comment: NOTE:  Non-HDL goal should be 30 mg/dL higher than patient's LDL goal (i.e. LDL goal of < 70 mg/dL, would have non-HDL goal of < 100 mg/dL)  Basic metabolic panel     Status: None   Collection Time: 08/30/16 10:49 AM  Result Value Ref Range   Sodium 139 135 - 145 mEq/L   Potassium 4.2 3.5 - 5.1 mEq/L   Chloride 104 96 - 112 mEq/L   CO2 29 19 - 32 mEq/L   Glucose, Bld 86 70 - 99 mg/dL   BUN 11 6 - 23 mg/dL   Creatinine, Ser 0.70 0.40 - 1.20 mg/dL   Calcium 9.7 8.4 - 10.5 mg/dL   GFR 97.53 >60.00 mL/min  TSH     Status: None   Collection Time: 08/30/16 10:49 AM  Result Value Ref Range   TSH 1.43 0.35 - 4.50 uIU/mL  Hepatic function panel     Status: None   Collection Time: 08/30/16 10:49 AM  Result Value Ref Range   Total Bilirubin 0.7 0.2 - 1.2 mg/dL   Bilirubin, Direct 0.1 0.0 - 0.3 mg/dL   Alkaline Phosphatase 81 39 - 117 U/L   AST 20 0 - 37 U/L   ALT 30 0 - 35 U/L   Total Protein 7.5 6.0 - 8.3 g/dL   Albumin 4.0 3.5 - 5.2 g/dL  CBC with Differential/Platelet     Status: Abnormal   Collection Time: 08/30/16 10:49 AM  Result Value Ref Range   WBC 13.6 (H) 4.0 - 10.5 K/uL   RBC 4.65 3.87 - 5.11 Mil/uL   Hemoglobin 14.5 12.0 - 15.0 g/dL   HCT 42.7 36.0 - 46.0 %   MCV 91.8 78.0 - 100.0 fl   MCHC 34.0 30.0 - 36.0 g/dL   RDW 12.8 11.5 - 15.5 %   Platelets 299.0 150.0 - 400.0 K/uL   Neutrophils Relative % 67.4 43.0 - 77.0 %   Lymphocytes Relative 24.8 12.0 - 46.0 %   Monocytes Relative 5.9 3.0 - 12.0 %   Eosinophils Relative 1.4 0.0 - 5.0 %   Basophils Relative 0.5 0.0 - 3.0 %   Neutro Abs 9.2 (H) 1.4 - 7.7 K/uL   Lymphs Abs 3.4 0.7 - 4.0 K/uL   Monocytes Absolute 0.8 0.1 - 1.0 K/uL   Eosinophils Absolute 0.2 0.0 - 0.7 K/uL   Basophils Absolute 0.1 0.0 - 0.1 K/uL  VITAMIN D 25 Hydroxy (Vit-D Deficiency, Fractures)     Status: Abnormal   Collection Time: 08/30/16 10:49 AM  Result Value Ref  Range   VITD 14.74 (L) 30.00 - 100.00 ng/mL   Assessment/Plan: 1. Abnormal WBC count Noted on prior labs. Repeat WBC ordered by PCP. Patient will have drawn today. - CBC with Differential/Platelet  2. Vision changes Intermittent. Some associated headache. BP overall stable. No alarm signs/symptoms. Patient with chronic intermittent vertigo, followed by Neurology. Denies recent symptoms or change in baseline. Exam today unremarkable but fundic exam here in office is not a comparison to a dilated fundoscopic exam with Ophthalmology. Symptoms seem to be consistent with an ocular migraine or migraine variant, but needs further assessment. Patient will start DASH diet and check BP during symptoms and record. Will schedule appointment with her Ophthalmologist Dr Clydene Laming for eye examination. If unremarkable, will have her follow-up with Dr. Felecia Shelling for  ocular migraine. Alarm signs/symptoms discussed that would prompt ER assessment.     Leeanne Rio, PA-C

## 2016-09-28 ENCOUNTER — Other Ambulatory Visit: Payer: Self-pay | Admitting: Family Medicine

## 2016-09-28 ENCOUNTER — Telehealth: Payer: Self-pay | Admitting: Family Medicine

## 2016-09-28 DIAGNOSIS — D72829 Elevated white blood cell count, unspecified: Secondary | ICD-10-CM

## 2016-09-28 NOTE — Telephone Encounter (Signed)
Patients states she spoke with nurse earlier regarding lab results and she forgot to ask her what WBC level was.  She was told it was elevated but she needs to know what the actual level was and what the normal range is for this test.

## 2016-09-28 NOTE — Telephone Encounter (Signed)
Called pt and left a detailed message to inform that WBC is white blood cell count and her level was 13.3

## 2016-10-25 ENCOUNTER — Ambulatory Visit: Payer: Managed Care, Other (non HMO)

## 2016-10-25 ENCOUNTER — Ambulatory Visit (HOSPITAL_BASED_OUTPATIENT_CLINIC_OR_DEPARTMENT_OTHER): Payer: Managed Care, Other (non HMO) | Admitting: Hematology & Oncology

## 2016-10-25 ENCOUNTER — Other Ambulatory Visit (HOSPITAL_BASED_OUTPATIENT_CLINIC_OR_DEPARTMENT_OTHER): Payer: Managed Care, Other (non HMO)

## 2016-10-25 VITALS — BP 141/85 | HR 101 | Temp 98.9°F | Wt 228.4 lb

## 2016-10-25 DIAGNOSIS — D72823 Leukemoid reaction: Secondary | ICD-10-CM

## 2016-10-25 DIAGNOSIS — Z8041 Family history of malignant neoplasm of ovary: Secondary | ICD-10-CM | POA: Diagnosis not present

## 2016-10-25 DIAGNOSIS — Z800 Family history of malignant neoplasm of digestive organs: Secondary | ICD-10-CM

## 2016-10-25 DIAGNOSIS — D72829 Elevated white blood cell count, unspecified: Secondary | ICD-10-CM | POA: Diagnosis not present

## 2016-10-25 LAB — CBC WITH DIFFERENTIAL (CANCER CENTER ONLY)
BASO#: 0 10*3/uL (ref 0.0–0.2)
BASO%: 0.2 % (ref 0.0–2.0)
EOS%: 1.5 % (ref 0.0–7.0)
Eosinophils Absolute: 0.2 10*3/uL (ref 0.0–0.5)
HCT: 39.8 % (ref 34.8–46.6)
HGB: 13.9 g/dL (ref 11.6–15.9)
LYMPH#: 3.4 10*3/uL — ABNORMAL HIGH (ref 0.9–3.3)
LYMPH%: 25.5 % (ref 14.0–48.0)
MCH: 31.3 pg (ref 26.0–34.0)
MCHC: 34.9 g/dL (ref 32.0–36.0)
MCV: 90 fL (ref 81–101)
MONO#: 1.1 10*3/uL — ABNORMAL HIGH (ref 0.1–0.9)
MONO%: 8.2 % (ref 0.0–13.0)
NEUT#: 8.5 10*3/uL — ABNORMAL HIGH (ref 1.5–6.5)
NEUT%: 64.6 % (ref 39.6–80.0)
PLATELETS: 316 10*3/uL (ref 145–400)
RBC: 4.44 10*6/uL (ref 3.70–5.32)
RDW: 12.4 % (ref 11.1–15.7)
WBC: 13.2 10*3/uL — ABNORMAL HIGH (ref 3.9–10.0)

## 2016-10-25 LAB — CHCC SATELLITE - SMEAR

## 2016-10-25 NOTE — Progress Notes (Signed)
Referral MD  Reason for Referral: Leukocytosis-reactive   Chief Complaint  Patient presents with  . New Patient (Initial Visit)    High white blood cell  : My white blood cell count has been high.  HPI: Erika Weaver is a very charming 41 year old white female. 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  She has been found to have mild leukocytosis over the past year or so. Going back to May 2016, her white cell count was slightly elevated at 12.1. She had a normal hemoglobin and platelet count.  In April 2017, her white cell count 13.8. Hemoglobin 13.9 and platelet count 297,000.  In November 2017, her white cell count was 13.6. Hemoglobin 14.5. Platelet count 299,000.  She has had no problem with infections. She did have a problem after her pregnancy. She required some surgery. She had some collagen implanted I think for some rectal issues. She has enlarged tonsils. She has had problems with tonsillitis in the past.  She does not have a rash. She does not smoke. She's had no problems with her monthly cycles. She has had no issues with coughing. She's had no weight loss or weight gain. She is not a vegetarian.  She has had her mammogram already.  There is no history in the family of any type of blood problems.  She still has her spleen in.  Overall, her performance status is ECOG 0.    Past Medical History:  Diagnosis Date  . Allergic state 11/22/2011  . Anxiety 11/22/2011  . Back pain 01/31/2013  . BPV (benign positional vertigo) 08/23/2013  . Chicken pox as a child  . Chronic kidney disease    kidney stones  . Dehydration 01/31/2013  . Elevated BP 11/22/2011  . Fatigue 01/11/2012  . Headache(784.0) 11/22/2011  . Hypertension   . Kidney stone 12/21/2012  . Other and unspecified hyperlipidemia 03/07/2014  . Overweight(278.02)   . Pharyngitis 01/11/2012  . Plantar fasciitis 01/11/2012  . Preventative health care 08/18/2013  . 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  Sinusitis 11/22/2011  . Sleep apnea 04/03/2012   setting 2  . Visual changes 11/22/2011  :  Past Surgical History:  Procedure Laterality Date  . GUM SURGERY    . PERINEAL BODY REPAIR     4 th degree tear with fistula between vagina and retum requiring 3 surgeries for correction  . rectal fistula repair     x 2  . WISDOM TOOTH EXTRACTION    :   Current Outpatient Prescriptions:  .  amLODipine (NORVASC) 5 MG tablet, Take 1 tablet (5 mg total) by mouth daily., Disp: 90 tablet, Rfl: 3 .  Cholecalciferol (VITAMIN D3) 2000 units TABS, Take 1 tablet by mouth daily., Disp: , Rfl:  .  fluticasone (FLONASE) 50 MCG/ACT nasal spray, Place 2 sprays into both nostrils daily., Disp: 16 g, Rfl: 6:  :  No Known Allergies:  Family History  Problem Relation Age of Onset  . Diabetes Mother     type 2  . Hypertension Mother   . Hyperlipidemia Mother   . Hypertension Father   . Hyperlipidemia Father   . Aneurysm Father     abdominal aorta  . Cancer Maternal Grandmother 82    colon, ovarian  . Other Maternal Grandfather 41    brain hemorrhage  . Stroke Maternal Grandfather   . Aneurysm Maternal Grandfather   . Heart attack Paternal Grandmother   . Heart disease Paternal Grandmother     MI  .  Alzheimer's disease Paternal Grandfather   . Dementia Paternal Grandfather   . Heart attack Paternal Aunt   . Heart disease Paternal Aunt      AAA rupture  . Other Paternal Uncle     cerebreal brain hemorrhage  . Stroke Paternal Uncle   . Aneurysm Paternal Uncle   . Asthma Daughter   . Other Daughter     peanut allergy  . Heart disease Maternal Aunt     s/p cabg. brain aneurysm s/p coiling  . Aneurysm Maternal Aunt     coil behind eye  :  Social History   Social History  . Marital status: Married    Spouse name: N/A  . Number of children: N/A  . Years of education: N/A   Occupational History  . Not on file.   Social History Main Topics  . Smoking status: Never Smoker  . Smokeless  tobacco: Never Used  . Alcohol use No  . Drug use: No  . Sexual activity: Yes    Partners: Male    Birth control/ protection: None     Comment: lives with husband, daughter, no dietary restrictions   Other Topics Concern  . Not on file   Social History Narrative  . No narrative on file  :  Pertinent items are noted in HPI.  Exam: @IPVITALS @  Mildly obese white female in no obvious distress. Vital signs are temperature of 98.9. Pulse 101. Blood pressure 141/85. Weight is 228 pounds. Head neck exam shows no ocular or oral lesions. There are no palpable cervical or supraclavicular lymph nodes. Lungs are clear to percussion and auscultation bilaterally. Cardiac exam regular in rhythm with no murmurs, rubs or bruits. Abdomen is soft. She has good bowel sounds. There is no fluid wave. There is no palpable liver or spleen tip. Back exam shows no tenderness over the spine, ribs or hips. Extremities shows no clubbing, cyanosis or edema. Neurological exam shows no focal neurological deficits. Skin exam shows no rashes, ecchymoses or petechia.    Recent Labs  10/25/16 1457  WBC 13.2*  HGB 13.9  HCT 39.8  PLT 316   No results for input(s): NA, K, CL, CO2, GLUCOSE, BUN, CREATININE, CALCIUM in the last 72 hours.  Blood smear review:  Normochromic and normocytic population of red blood cells. She has no nucleated red blood cells. There are no teardrop cells. She has no rouleau formation. There are no inclusion bodies. White cells are minimally increased in number. White cells are mature. There are no hypersegmented polys. There are no immature myeloid or lymphoid forms. Platelets are adequate in number and size.  Pathology: None     Assessment and Plan:  Erika Weaver is a very nice 41 year old white female. She has mild leukocytosis. I have to believe this is a reactive leukocytosis. I do not see anything that looks suspicious for a bone marrow issue. Her blood smear is normal in appearance. I  find nothing on her physical exam to suggest a myeloproliferative disorder.  For now, I do not see that we have to do any testing. She does not eat a bone marrow test. Again I don't see anything that suggest a myeloproliferative disorder.  I will like to get her back in 4 months. I think this would be reasonable. At that point, if all looks good, then we can let her go from the practice.  I spent about 45 minutes with she and her parents. I gave her a prayer blanket. She is  very grateful for this.  I answered all their questions.

## 2016-12-06 ENCOUNTER — Ambulatory Visit: Payer: Managed Care, Other (non HMO) | Admitting: Neurology

## 2016-12-20 ENCOUNTER — Telehealth: Payer: Self-pay | Admitting: *Deleted

## 2016-12-20 DIAGNOSIS — R42 Dizziness and giddiness: Secondary | ICD-10-CM

## 2016-12-20 NOTE — Telephone Encounter (Signed)
Pt would need to be seen here prior to referral as we need to assess her dizziness

## 2016-12-20 NOTE — Telephone Encounter (Signed)
Patient would like a referral for the lightheaded/dizziness.   She would like a referral with  Dr. Quay Burow as this is who her mom sees.         Note for referral:    Patient is off on Thursday, and this day is better for appointments.

## 2016-12-21 MED ORDER — MECLIZINE HCL 25 MG PO TABS: 25.0000 mg | ORAL_TABLET | Freq: Three times a day (TID) | ORAL | 0 refills | Status: DC | PRN

## 2016-12-21 NOTE — Telephone Encounter (Signed)
Patient does benefit from medication.  She stated that she does not take this every day, but when she does need something it does seem to help.   Medication has been called in to pharmacy.

## 2016-12-21 NOTE — Telephone Encounter (Signed)
Patient has scheduled an appointment for next week.   She is taking meclizine 56m that she got from Dr. BRandel Pigg- she is asking if she can get any of this before that appointment (she only has 1 pill left).      Is this okay to send in?

## 2016-12-21 NOTE — Telephone Encounter (Signed)
Is the meclizine helping her dizziness?  If yes, we can refill Meclizine 48m TID PRN, #[id number]

## 2016-12-27 ENCOUNTER — Encounter: Payer: Self-pay | Admitting: Family Medicine

## 2016-12-27 ENCOUNTER — Ambulatory Visit (INDEPENDENT_AMBULATORY_CARE_PROVIDER_SITE_OTHER): Payer: Managed Care, Other (non HMO) | Admitting: Family Medicine

## 2016-12-27 VITALS — BP 130/83 | HR 83 | Temp 98.3°F | Resp 17 | Ht 65.0 in | Wt 225.0 lb

## 2016-12-27 DIAGNOSIS — R42 Dizziness and giddiness: Secondary | ICD-10-CM

## 2016-12-27 NOTE — Progress Notes (Signed)
Pre visit review using our clinic review tool, if applicable. No additional management support is needed unless otherwise documented below in the visit note. 

## 2016-12-27 NOTE — Patient Instructions (Addendum)
Follow up as needed/scheduled We'll call you with your cardiology appt for assessment of dizziness Continue to work on healthy diet and regular exercise- you can do it! Keep up the good work on the water intake!! Continue the Meclizine as needed Ask Dr Felecia Shelling about the MRA as a possibility Call with any questions or concerns Hang in there!!!

## 2016-12-27 NOTE — Progress Notes (Signed)
   Subjective:    Patient ID: Erika Weaver, female    DOB: [birth year], 41 y.o.   MRN: [id number]  HPI Dizziness- pt reports 6 yrs of sxs intermittently.  Has been to ENT Lucia Gaskins) and Neuro Tomi Likens and then [facility]).  Has had eye exam- repeat is scheduled for 3/28.  Had vestibular PT.  Has had 4 MRIs and 2 CTs that have not provided any insight to her sxs.  Reports she will have 'a few weeks of good days' and then 'it will flare up' and she'll have 'constant dizziness for 2-3 weeks'.  No relief w/ 1 meclizine, mild improvement w/ 2 meclizine.  Dr Marin Olp recommended pt have cardiology referral and possibly an MRA.  Pt reports sxs will intermittently impact her ability to drive.  Pt has recently started exercising (walking), stopped caffeine (sodas), increased water intake- 5 bottles/day.  sxs do not disturb sleep.   Review of Systems For ROS see HPI     Objective:   Physical Exam  Constitutional: She is oriented to person, place, and time. She appears well-developed and well-nourished. No distress.  HENT:  Head: Normocephalic and atraumatic.  Mouth/Throat: Uvula is midline and mucous membranes are normal.  TMs WNL No TTP over sinuses Minimal nasal congestion  Eyes: Conjunctivae and EOM are normal. Pupils are equal, round, and reactive to light.  Neck: Normal range of motion. Neck supple.  Cardiovascular: Normal rate, regular rhythm, normal heart sounds and intact distal pulses.   Pulmonary/Chest: Effort normal and breath sounds normal. No respiratory distress. She has no wheezes. She has no rales.  Musculoskeletal: She exhibits no edema.  Lymphadenopathy:    She has no cervical adenopathy.  Neurological: She is alert and oriented to person, place, and time. She has normal reflexes. No cranial nerve deficit.  Skin: Skin is warm and dry.  Psychiatric: She has a normal mood and affect. Her behavior is normal. Judgment and thought content normal.  Vitals reviewed.         Assessment  & Plan:  Dizziness- ongoing issue for pt.  Reviewed hx- including notes from Dr Lucia Gaskins, Dr Tomi Likens, and Dr Felecia Shelling.  Reviewed imaging.  Pt is asymptomatic today.  Reviewed recent labs- unremarkable w/ exception of low Vit D (being treated) and elevated WBC (seeing Dr Marin Olp).  sxs will cause difficulties with pt's day to day activities- work, driving, etc.  Dr Marin Olp recommended cardiac evaluation as possible cause for dizziness- referral placed.  Pt is very frustrated by her sxs and lack of dx.  Only some improvement w/ 10m Meclizine.  If cardiac workup is unrevealing, will need to consider referral to tertiary care center.  Pt expressed understanding and is in agreement w/ plan.

## 2017-01-15 ENCOUNTER — Encounter: Payer: Self-pay | Admitting: Cardiovascular Disease

## 2017-01-15 ENCOUNTER — Ambulatory Visit (INDEPENDENT_AMBULATORY_CARE_PROVIDER_SITE_OTHER): Payer: Managed Care, Other (non HMO) | Admitting: Cardiovascular Disease

## 2017-01-15 VITALS — BP 145/96 | HR 74 | Ht 65.0 in | Wt 228.2 lb

## 2017-01-15 DIAGNOSIS — I10 Essential (primary) hypertension: Secondary | ICD-10-CM

## 2017-01-15 DIAGNOSIS — R42 Dizziness and giddiness: Secondary | ICD-10-CM | POA: Diagnosis not present

## 2017-01-15 NOTE — Assessment & Plan Note (Signed)
Erika Weaver was referred to me by Dr. Birdie Riddle for evaluation of dizziness. She is a 41 year old mildly overweight married Caucasian female whose mother also is a patient of mine. She's had dizziness for 7 years. The dizziness can be absent for weeks that time and then occur for 2-3 weeks' time. There is nothing in particular that brings it on. Associated with any activities. Last off-and-on all day. She's had multiple evaluations the past including ENT and MRI of her brain. I do not think this is cardiovascular although I am going to get a 2-D echo as well as a 30 day event monitor. This sounds like a migrainous equivalent.

## 2017-01-15 NOTE — Assessment & Plan Note (Signed)
History of hypertension on amlodipine. Her blood pressure today in the office is 145/96 although at home is much lower than this. Continue current medications.

## 2017-01-15 NOTE — Patient Instructions (Signed)
Medication Instructions: Your physician recommends that you continue on your current medications as directed. Please refer to the Current Medication list given to you today.   Testing/Procedures: Your physician has requested that you have an echocardiogram. Echocardiography is a painless test that uses sound waves to create images of your heart. It provides your doctor with information about the size and shape of your heart and how well your heart's chambers and valves are working. This procedure takes approximately one hour. There are no restrictions for this procedure.  Your physician has recommended that you wear an 30 day event monitor . Event monitors are medical devices that record the heart's electrical activity. Doctors most often [place] these monitors to diagnose arrhythmias. Arrhythmias are problems with the speed or rhythm of the heartbeat. The monitor is a small, portable device. You can wear one while you do your normal daily activities. This is usually used to diagnose what is causing palpitations/syncope (passing out).  Follow-Up: Your physician recommends that you schedule a follow-up appointment as needed with Dr. Gwenlyn Found.

## 2017-01-15 NOTE — Progress Notes (Signed)
01/15/2017 Erika Weaver   [birth year]  [id number]  Primary Physician Erika Asa, MD Primary Cardiologist: Erika Harp MD Erika Weaver  HPI:  Erika Weaver is a very pleasant 41 year old mildly overweight married Caucasian female mother to one 10 year old daughter. She is accompanied by her mother who also is a patient of mine. She was referred by Dr. Birdie Weaver for cardiovascular evaluation because of episodic dizziness. Her only risk factor is treated hypertension. She's never had a heart attack or stroke. She denies chest pain or shortness of breath. The dizziness began 7 years ago. She has had multiple MRIs for bleeding as well as ENT evaluation for vertigo. The dizziness can occur for weeks that time lasting all day and then disappear for weeks at a time. There are no other associated symptoms.   Current Outpatient Prescriptions  Medication Sig Dispense Refill  . amLODipine (NORVASC) 5 MG tablet Take 1 tablet (5 mg total) by mouth daily. 90 tablet 3  . Cholecalciferol (VITAMIN D3) 2000 units TABS Take 1 tablet by mouth daily.    . meclizine (ANTIVERT) 25 MG tablet Take 1 tablet (25 mg total) by mouth 3 (three) times daily as needed for dizziness. 30 tablet 0  . SALINE NASAL MIST NA Place 1 spray into the nose as directed.      No current facility-administered medications for this visit.     No Known Allergies  Social History   Social History  . Marital status: Married    Spouse name: N/A  . Number of children: N/A  . Years of education: N/A   Occupational History  . Not on file.   Social History Main Topics  . Smoking status: Never Smoker  . Smokeless tobacco: Never Used  . Alcohol use No  . Drug use: No  . Sexual activity: Yes    Partners: Male    Birth control/ protection: None     Comment: lives with husband, daughter, no dietary restrictions   Other Topics Concern  . Not on file   Social History Narrative  . No narrative on file      Review of Systems: General: negative for chills, fever, night sweats or weight changes.  Cardiovascular: negative for chest pain, dyspnea on exertion, edema, orthopnea, palpitations, paroxysmal nocturnal dyspnea or shortness of breath Dermatological: negative for rash Respiratory: negative for cough or wheezing Urologic: negative for hematuria Abdominal: negative for nausea, vomiting, diarrhea, bright red blood per rectum, melena, or hematemesis Neurologic: negative for visual changes, syncope, or dizziness All other systems reviewed and are otherwise negative except as noted above.    Blood pressure (!) 145/96, pulse 74, height 5' 5"  (1.651 m), weight 228 lb 3.2 oz (103.5 kg), SpO2 98 %.  General appearance: alert and no distress Neck: no adenopathy, no carotid bruit, no JVD, supple, symmetrical, trachea midline and thyroid not enlarged, symmetric, no tenderness/mass/nodules Lungs: clear to auscultation bilaterally Heart: regular rate and rhythm, S1, S2 normal, no murmur, click, rub or gallop Extremities: extremities normal, atraumatic, no cyanosis or edema  EKG sinus rhythm at 74 without ST or T-wave changes. I personally reviewed this EKG  ASSESSMENT AND PLAN:   Dizziness Erika Weaver was referred to me by Dr. Birdie Weaver for evaluation of dizziness. She is a 41 year old mildly overweight married Caucasian female whose mother also is a patient of mine. She's had dizziness for 7 years. The dizziness can be absent for weeks that time and then occur for 2-3 weeks' time.  There is nothing in particular that brings it on. Associated with any activities. Last off-and-on all day. She's had multiple evaluations the past including ENT and MRI of her brain. I do not think this is cardiovascular although I am going to get a 2-D echo as well as a 30 day event monitor. This sounds like a migrainous equivalent.  Essential hypertension History of hypertension on amlodipine. Her blood pressure today in the  office is 145/96 although at home is much lower than this. Continue current medications.      Erika Harp MD FACP,FACC,FAHA, [facility] 01/15/2017 3:30 PM

## 2017-01-17 ENCOUNTER — Encounter: Payer: Self-pay | Admitting: Neurology

## 2017-01-17 ENCOUNTER — Ambulatory Visit (INDEPENDENT_AMBULATORY_CARE_PROVIDER_SITE_OTHER): Payer: Managed Care, Other (non HMO) | Admitting: Neurology

## 2017-01-17 VITALS — BP 134/89 | HR 78 | Resp 8 | Ht 65.0 in | Wt 225.5 lb

## 2017-01-17 DIAGNOSIS — R42 Dizziness and giddiness: Secondary | ICD-10-CM | POA: Diagnosis not present

## 2017-01-17 DIAGNOSIS — F419 Anxiety disorder, unspecified: Secondary | ICD-10-CM

## 2017-01-17 DIAGNOSIS — G4489 Other headache syndrome: Secondary | ICD-10-CM | POA: Diagnosis not present

## 2017-01-17 DIAGNOSIS — G4733 Obstructive sleep apnea (adult) (pediatric): Secondary | ICD-10-CM

## 2017-01-17 NOTE — Progress Notes (Signed)
[facility]  PATIENT: Erika Weaver DOB: 1976-02-19  REFERRING DOCTOR OR PCP:  Penni Homans SOURCE/Data [place]: Patient, records in EMR, lab results, MRI results, MRI images on PACS.  _________________________________   HISTORICAL  CHIEF COMPLAINT:  Chief Complaint  Patient presents with  . Dizziness    Sts. spells of lightheadedness and h/a continue to be intermittent, about the same as at last ov.  She stopped Buspar b/c she didn't like the way it made her feel.  Continues to use CPAP for OSA/fim  . Headaches    HISTORY OF PRESENT ILLNESS:   Erika Weaver is a 41 year old woman with intermittent spells of lightheadedness , anxiety, headaches and OSA  Spells of lightheadedness:  She gets intermittent spells of lightheadedness that occur for a week at a time and then she is better for a few weeks.  She did much better over the summer but is starting to get more spells of lightheadedness.  When they occur, they occur 4-5 times a day lasting 1-2 hours at a time.   She feels lightheaded but not vertiginous. They can occur in any position.    No change in hearing.        MRI is essentially normal with just mild ethmoid sinusitis.   She was evaluated by ENT and no source or her symptoms were found. She denies any change in hearing.  HA:   She is only having a few headaches and they are unrelated to the lightheadedness.      Meclizine has eased it when more severe.    She has had headaches off and on for the past 5 or 6 years as well.   The headache is in the entire head and is nagging more than painful at this point.   OSA:   She has OSA and uses CPAP nightly.   She is currently getting 8-9 hours/sleep a night.     She falls asleep easily and stays asleep fairly well. She denies any insomnia. She denies excessive daytime sleepiness, though there was not much prior to the CPAP either.       Anxiety:   She denies depression. Her anxiety improved and she stopped  BuSpar (it also made her feel strange).    REVIEW OF SYSTEMS: Constitutional: No fevers, chills, sweats, or change in appetite Eyes: No visual changes, double vision, eye pain Ear, nose and throat: No hearing loss, ear pain, nasal congestion, sore throat Cardiovascular: No chest pain, palpitations Respiratory: No shortness of breath at rest or with exertion.   No wheezes.  On CPAP for OSA GastrointestinaI: No nausea, vomiting, diarrhea, abdominal pain, fecal incontinence Genitourinary: No dysuria, urinary retention or frequency.  No nocturia. Musculoskeletal: No neck pain, back pain Integumentary: No rash, pruritus, skin lesions Neurological: as above Psychiatric: No depression at this time.  Notes mild anxiety and stress at times Endocrine: No palpitations, diaphoresis, change in appetite, change in weigh or increased thirst Hematologic/Lymphatic: No anemia, purpura, petechiae. Allergic/Immunologic: No itchy/runny eyes, nasal congestion, recent allergic reactions, rashes  ALLERGIES: No Known Allergies  HOME MEDICATIONS:  Current Outpatient Prescriptions:  .  amLODipine (NORVASC) 5 MG tablet, Take 1 tablet (5 mg total) by mouth daily., Disp: 90 tablet, Rfl: 3 .  Cholecalciferol (VITAMIN D3) 2000 units TABS, Take 1 tablet by mouth daily., Disp: , Rfl:  .  meclizine (ANTIVERT) 25 MG tablet, Take 1 tablet (25 mg total) by mouth 3 (three) times daily as needed for dizziness., Disp: 30 tablet,  Rfl: 0 .  SALINE NASAL MIST NA, Place 1 spray into the nose as directed. , Disp: , Rfl:   PAST MEDICAL HISTORY:   PAST SURGICAL HISTORY: Past Surgical History:  Procedure Laterality Date  . GUM SURGERY    . PERINEAL BODY REPAIR     4 th degree tear with fistula between vagina and retum requiring 3 surgeries for correction  . rectal fistula repair     x 2  . WISDOM TOOTH EXTRACTION      FAMILY HISTORY: Family History  Problem Relation Age of Onset  . Diabetes Mother     type 2  .  Hypertension Mother   . Hyperlipidemia Mother   . Hypertension Father   . Hyperlipidemia Father   . Aneurysm Father     abdominal aorta  . Cancer Maternal Grandmother 82    colon, ovarian  . Other Maternal Grandfather 41    brain hemorrhage  . Stroke Maternal Grandfather   . Aneurysm Maternal Grandfather   . Heart attack Paternal Grandmother   . Heart disease Paternal Grandmother     MI  . Alzheimer's disease Paternal Grandfather   . Dementia Paternal Grandfather   . Heart attack Paternal Aunt   . Heart disease Paternal Aunt      AAA rupture  . Other Paternal Uncle     cerebreal brain hemorrhage  . Stroke Paternal Uncle   . Aneurysm Paternal Uncle   . Asthma Daughter   . Other Daughter     peanut allergy  . Heart disease Maternal Aunt     s/p cabg. brain aneurysm s/p coiling  . Aneurysm Maternal Aunt     coil behind eye    SOCIAL HISTORY:  Social History   Social History  . Marital status: Married    Spouse name: N/A  . Number of children: N/A  . Years of education: N/A   Occupational History  . Not on file.   Social History Main Topics  . Smoking status: Never Smoker  . Smokeless tobacco: Never Used  . Alcohol use No  . Drug use: No  . Sexual activity: Yes    Partners: Male    Birth control/ protection: None     Comment: lives with husband, daughter, no dietary restrictions   Other Topics Concern  . Not on file   Social History Narrative  . No narrative on file     PHYSICAL EXAM  Vitals:   01/17/17 1052  BP: 134/89  Pulse: 78  Resp: (!) 8  Weight: 225 lb 8 oz (102.3 kg)  Height: 5' 5"  (1.651 m)   No data found.    Body mass index is 37.53 kg/m.   General: The patient is well-developed and well-nourished and in no acute distress/   Pulse strong and regular   Neurologic Exam  Mental status: The patient is alert and oriented x 3 at the time of the examination. The patient has apparent normal recent and remote memory, with an  apparently normal attention span and concentration ability.   Speech is normal.  Cranial nerves: Extraocular movements are full.  Facial strength and sensation is normal. Trapezius and sternocleidomastoid strength is normal. No dysarthria is noted.  The tongue is midline, and the patient has symmetric elevation of the soft palate. No obvious hearing deficits are noted.  Motor:  Muscle bulk is normal.   Tone is normal. Strength is  5 / 5 in all 4 extremities.   Sensory: Sensory testing  is intact to touch and vibration sensation in all 4 extremities.  Coordination: Cerebellar testing reveals good finger-nose-fingerbilaterally.  Gait and station: Station is normal.   Gait is normal. Tandem gait is normal.   Reflexes: Deep tendon reflexes are symmetric and normal bilaterally.       DIAGNOSTIC DATA (LABS, IMAGING, TESTING) - I reviewed patient records, labs, notes, testing and imaging myself where available.  Lab Results  Component Value Date   WBC 13.2 (H) 10/25/2016   HGB 13.9 10/25/2016   HCT 39.8 10/25/2016   MCV 90 10/25/2016   PLT 316 10/25/2016      Component Value Date/Time   NA 139 08/30/2016 1049   K 4.2 08/30/2016 1049   CL 104 08/30/2016 1049   CO2 29 08/30/2016 1049   GLUCOSE 86 08/30/2016 1049   BUN 11 08/30/2016 1049   CREATININE 0.70 08/30/2016 1049   CREATININE 0.72 05/27/2014 0944   CALCIUM 9.7 08/30/2016 1049   PROT 7.5 08/30/2016 1049   ALBUMIN 4.0 08/30/2016 1049   AST 20 08/30/2016 1049   ALT 30 08/30/2016 1049   ALKPHOS 81 08/30/2016 1049   BILITOT 0.7 08/30/2016 1049   GFRNONAA >90 11/26/2014 1030   GFRAA >90 11/26/2014 1030   Lab Results  Component Value Date   CHOL 215 (H) 08/30/2016   HDL 45.80 08/30/2016   LDLCALC 147 (H) 08/30/2016   TRIG 111.0 08/30/2016   CHOLHDL 5 08/30/2016   No results found for: HGBA1C No results found for: VITAMINB12 Lab Results  Component Value Date   TSH 1.43 08/30/2016       ASSESSMENT AND  PLAN  Dizziness  Other headache syndrome  Obstructive sleep apnea syndrome  Anxiety    1.   Episodes of lightheadedness persist.    Cardiac w/u has been negative to date.    MRA to rule out vertebrobasilar insufficiency 2.   Continue to use CPAP nightly. Try to get 8 hours of sleep if possible. 3.   Continue Vit D 4.   She will return to see me in 6 months or sooner if there are new or worsening neurologic symptoms.   Francois Elk A. Felecia Shelling, MD, PhD 7/57/9728, 20:60 AM Certified in Neurology, Clinical Neurophysiology, Sleep Medicine, Pain Medicine and Neuroimaging  [facility] [address], [place], [place] [address] [phone]

## 2017-01-24 ENCOUNTER — Ambulatory Visit (INDEPENDENT_AMBULATORY_CARE_PROVIDER_SITE_OTHER): Payer: Managed Care, Other (non HMO)

## 2017-01-24 DIAGNOSIS — R42 Dizziness and giddiness: Secondary | ICD-10-CM

## 2017-01-28 ENCOUNTER — Other Ambulatory Visit: Payer: Self-pay | Admitting: Neurology

## 2017-01-31 ENCOUNTER — Telehealth: Payer: Self-pay | Admitting: Neurology

## 2017-01-31 ENCOUNTER — Other Ambulatory Visit: Payer: Self-pay

## 2017-01-31 ENCOUNTER — Ambulatory Visit (HOSPITAL_COMMUNITY): Payer: Managed Care, Other (non HMO) | Attending: Internal Medicine

## 2017-01-31 DIAGNOSIS — R42 Dizziness and giddiness: Secondary | ICD-10-CM | POA: Diagnosis not present

## 2017-01-31 DIAGNOSIS — G4733 Obstructive sleep apnea (adult) (pediatric): Secondary | ICD-10-CM | POA: Insufficient documentation

## 2017-01-31 DIAGNOSIS — I517 Cardiomegaly: Secondary | ICD-10-CM | POA: Insufficient documentation

## 2017-01-31 DIAGNOSIS — R51 Headache: Secondary | ICD-10-CM | POA: Insufficient documentation

## 2017-01-31 MED ORDER — PERFLUTREN LIPID MICROSPHERE
1.0000 mL | INTRAVENOUS | Status: AC | PRN
  Administered 2017-01-31: 2 mL via INTRAVENOUS

## 2017-01-31 NOTE — Telephone Encounter (Signed)
Noted, thank you I will inform Noelia.

## 2017-01-31 NOTE — Telephone Encounter (Signed)
Erika Weaver with [employer] me and informed me that Erika Weaver did not approved the MRI.Marland Kitchen Needing more clinical information.. The phone number for the peer to peer is [phone] and the case number is [id number] she is scheduled to have this exam done on Thursday 02/07/17.. Thank you for your help!

## 2017-01-31 NOTE — Telephone Encounter (Signed)
I have spoken with Erika Weaver. with Erika Weaver to provide additional information--mainly that lightheadedness has persisted despite neg cardiac and ent w/u's. Pt. treated also for OSA, which is a risk factor for vascular disease. No clear reason for lightheadedness, so RAS wishes to r/o vertebrobasilar insufficiencey.  She will send this info back for review.  2 day turnaround time/fim

## 2017-02-05 ENCOUNTER — Telehealth: Payer: Self-pay | Admitting: *Deleted

## 2017-02-05 ENCOUNTER — Telehealth: Payer: Self-pay | Admitting: Cardiovascular Disease

## 2017-02-05 NOTE — Telephone Encounter (Signed)
ECHOCARDIOGRAM COMPLETE  Order: [id number]  Status:  Final result Visible to patient:  No (Not Released) Dx:  Dizziness  Notes recorded by Therisa Doyne on 02/05/2017 at 2:04 PM EDT lmtcb for results. ------  Notes recorded by Lorretta Harp, MD on 02/01/2017 at 4:12 PM EDT Normal LV systolic function, moderate LVH

## 2017-02-05 NOTE — Telephone Encounter (Signed)
I called [employer] to check the status on the case because of her appointment being Thursday 4/19 at [facility]. And they informed me that it has been denied that it does not meet the clinical guidelines.. But they can do another reconsideration or a peer to peer.. The phone number is [phone] and the case number is [id number].

## 2017-02-05 NOTE — Telephone Encounter (Signed)
Nirvana husband just called in stating he got a letter and he called [employer] himself to see why it has been denied.. I told him it is still pending clinical information.Marland KitchenHe asked me if she could have the MRA still and I told him yeah but if the insurance denies it then it would be self pay because the insurance won't cover it if it is denied.Marland Kitchen

## 2017-02-05 NOTE — Telephone Encounter (Signed)
ECHOCARDIOGRAM COMPLETE  Order: [id number]  Status:  Final result Visible to patient:  No (Not Released) Dx:  Dizziness  Notes recorded by Therisa Doyne on 02/05/2017 at 2:24 PM EDT Results given to pt. Pt verbalized understanding.  ------  Notes recorded by Therisa Doyne on 02/05/2017 at 2:04 PM EDT lmtcb for results. ------  Notes recorded by Lorretta Harp, MD on 02/01/2017 at 4:12 PM EDT Normal LV systolic function, moderate LVH

## 2017-02-05 NOTE — Telephone Encounter (Signed)
I have spoken with Anderson Malta.  At this point, insurance has denied her MRA.  RAS is aware/fim

## 2017-02-05 NOTE — Telephone Encounter (Signed)
Pt called is scheduled at [facility] for MRA on this Thursday.  Is requesting xanax for she is claustrophobic.  [facility] [place].  Please advise.

## 2017-02-06 NOTE — Telephone Encounter (Signed)
I have spoken with Erika Weaver today and explained that RAS is not able to get MRA approved based on current sx and exam findings.  She verbalized understanding of same/fim

## 2017-02-06 NOTE — Telephone Encounter (Signed)
I have spoken with Ena Dawley today and per RAS, advised he will not pursue MRA approval.  He has no new information to give ins. co./fim

## 2017-02-06 NOTE — Telephone Encounter (Signed)
Erika Weaver with [facility] was wondering if Dr. Felecia Shelling was going to do a peer to peer or do they need to cancel her appointment for tomorrow morning.

## 2017-02-06 NOTE — Telephone Encounter (Signed)
Erika Weaver called me and asked what was the status with her insurance.. I informed it is still pending.. She said if she is going to have the MRA tomorrow morning she needed something send to her pharmacy today so she can have it for tomorrow morning.

## 2017-02-07 ENCOUNTER — Inpatient Hospital Stay: Admission: RE | Admit: 2017-02-07 | Payer: Managed Care, Other (non HMO) | Source: Ambulatory Visit

## 2017-02-07 NOTE — Telephone Encounter (Signed)
Noted, thank you

## 2017-02-28 ENCOUNTER — Ambulatory Visit (HOSPITAL_BASED_OUTPATIENT_CLINIC_OR_DEPARTMENT_OTHER): Payer: Managed Care, Other (non HMO) | Admitting: Hematology & Oncology

## 2017-02-28 ENCOUNTER — Encounter: Payer: Self-pay | Admitting: Family Medicine

## 2017-02-28 ENCOUNTER — Other Ambulatory Visit (HOSPITAL_BASED_OUTPATIENT_CLINIC_OR_DEPARTMENT_OTHER): Payer: Managed Care, Other (non HMO)

## 2017-02-28 ENCOUNTER — Ambulatory Visit (INDEPENDENT_AMBULATORY_CARE_PROVIDER_SITE_OTHER): Payer: Managed Care, Other (non HMO) | Admitting: Family Medicine

## 2017-02-28 VITALS — BP 130/79 | HR 77 | Temp 98.4°F | Resp 18 | Wt 227.0 lb

## 2017-02-28 VITALS — BP 128/86 | HR 75 | Resp 16 | Ht 65.0 in | Wt 226.5 lb

## 2017-02-28 DIAGNOSIS — D72823 Leukemoid reaction: Secondary | ICD-10-CM

## 2017-02-28 DIAGNOSIS — D72829 Elevated white blood cell count, unspecified: Secondary | ICD-10-CM | POA: Diagnosis not present

## 2017-02-28 DIAGNOSIS — I10 Essential (primary) hypertension: Secondary | ICD-10-CM

## 2017-02-28 DIAGNOSIS — E669 Obesity, unspecified: Secondary | ICD-10-CM | POA: Diagnosis not present

## 2017-02-28 LAB — CBC WITH DIFFERENTIAL (CANCER CENTER ONLY)
BASO#: 0.1 10*3/uL (ref 0.0–0.2)
BASO%: 0.4 % (ref 0.0–2.0)
EOS ABS: 0.2 10*3/uL (ref 0.0–0.5)
EOS%: 1.4 % (ref 0.0–7.0)
HCT: 39.4 % (ref 34.8–46.6)
HGB: 13.6 g/dL (ref 11.6–15.9)
LYMPH#: 3.6 10*3/uL — ABNORMAL HIGH (ref 0.9–3.3)
LYMPH%: 27.2 % (ref 14.0–48.0)
MCH: 31.3 pg (ref 26.0–34.0)
MCHC: 34.5 g/dL (ref 32.0–36.0)
MCV: 91 fL (ref 81–101)
MONO#: 1 10*3/uL — AB (ref 0.1–0.9)
MONO%: 7.9 % (ref 0.0–13.0)
NEUT#: 8.3 10*3/uL — ABNORMAL HIGH (ref 1.5–6.5)
NEUT%: 63.1 % (ref 39.6–80.0)
PLATELETS: 283 10*3/uL (ref 145–400)
RBC: 4.34 10*6/uL (ref 3.70–5.32)
RDW: 12.4 % (ref 11.1–15.7)
WBC: 13.1 10*3/uL — AB (ref 3.9–10.0)

## 2017-02-28 LAB — CBC WITH DIFFERENTIAL/PLATELET
Basophils Absolute: 0.1 10*3/uL (ref 0.0–0.1)
Basophils Relative: 1 % (ref 0.0–3.0)
EOS PCT: 1.4 % (ref 0.0–5.0)
Eosinophils Absolute: 0.2 10*3/uL (ref 0.0–0.7)
HCT: 41.1 % (ref 36.0–46.0)
HEMOGLOBIN: 13.9 g/dL (ref 12.0–15.0)
Lymphocytes Relative: 27 % (ref 12.0–46.0)
Lymphs Abs: 3.1 10*3/uL (ref 0.7–4.0)
MCHC: 33.7 g/dL (ref 30.0–36.0)
MCV: 92.5 fl (ref 78.0–100.0)
MONOS PCT: 5.8 % (ref 3.0–12.0)
Monocytes Absolute: 0.7 10*3/uL (ref 0.1–1.0)
Neutro Abs: 7.4 10*3/uL (ref 1.4–7.7)
Neutrophils Relative %: 64.8 % (ref 43.0–77.0)
Platelets: 302 10*3/uL (ref 150.0–400.0)
RBC: 4.45 Mil/uL (ref 3.87–5.11)
RDW: 13 % (ref 11.5–15.5)
WBC: 11.3 10*3/uL — AB (ref 4.0–10.5)

## 2017-02-28 LAB — HEPATIC FUNCTION PANEL
ALT: 23 U/L (ref 0–35)
AST: 17 U/L (ref 0–37)
Albumin: 3.9 g/dL (ref 3.5–5.2)
Alkaline Phosphatase: 72 U/L (ref 39–117)
BILIRUBIN TOTAL: 0.5 mg/dL (ref 0.2–1.2)
Bilirubin, Direct: 0.1 mg/dL (ref 0.0–0.3)
Total Protein: 7.3 g/dL (ref 6.0–8.3)

## 2017-02-28 LAB — LIPID PANEL
CHOL/HDL RATIO: 4
Cholesterol: 182 mg/dL (ref 0–200)
HDL: 42.2 mg/dL (ref >39.00)
LDL Cholesterol: 120 mg/dL — ABNORMAL HIGH (ref 0–99)
NonHDL: 139.53
TRIGLYCERIDES: 100 mg/dL (ref 0.0–149.0)
VLDL: 20 mg/dL (ref 0.0–40.0)

## 2017-02-28 LAB — CHCC SATELLITE - SMEAR

## 2017-02-28 LAB — BASIC METABOLIC PANEL
BUN: 10 mg/dL (ref 6–23)
CO2: 28 mEq/L (ref 19–32)
CREATININE: 0.7 mg/dL (ref 0.40–1.20)
Calcium: 9.2 mg/dL (ref 8.4–10.5)
Chloride: 104 mEq/L (ref 96–112)
GFR: 97.29 mL/min (ref >60.00)
Glucose, Bld: 91 mg/dL (ref 70–99)
POTASSIUM: 4.5 meq/L (ref 3.5–5.1)
Sodium: 138 mEq/L (ref 135–145)

## 2017-02-28 LAB — TSH: TSH: 1.49 u[IU]/mL (ref 0.35–4.50)

## 2017-02-28 NOTE — Progress Notes (Signed)
Hematology and Oncology Follow Up Visit  Erika Weaver [id number] Mar 10, 1976 41 y.o. 02/28/2017   Principle Diagnosis:   Leukocytosis-leukemoid reaction  Current Therapy:    Observation     Interim History:  Erika Weaver is back for second office visit. We first saw her back in January. At that time, everything looked okay with her lab work. I do not see thing that looked suspicious for a malignant process or myeloproliferative disorder.  She is doing well. 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  She's had no fever. She's had no rashes. She's had no cough. She's had no shortness of breath. She's had no change in bowel or bladder habits.  Her monthly cycle starts next week.  She is not a vegetarian.   She is worried about trying to lose weight. So far she's not lost weight.  Overall, her performance status is ECOG 0   Medications:  Current Outpatient Prescriptions:  .  amLODipine (NORVASC) 5 MG tablet, Take 1 tablet (5 mg total) by mouth daily., Disp: 90 tablet, Rfl: 3 .  Cholecalciferol (VITAMIN D3) 2000 units TABS, Take 1 tablet by mouth daily., Disp: , Rfl:  .  SALINE NASAL MIST NA, Place 1 spray into the nose as directed. , Disp: , Rfl:   Allergies: No Known Allergies  Past Medical History, Surgical history, Social history, and Family History were reviewed and updated.  Review of Systems: As above  Physical Exam:  weight is 227 lb (103 kg). Her oral temperature is 98.4 F (36.9 C). Her blood pressure is 130/79 and her pulse is 77. Her respiration is 18 and oxygen saturation is 100%.   Wt Readings from Last 3 Encounters:  02/28/17 227 lb (103 kg)  02/28/17 226 lb 8 oz (102.7 kg)  01/17/17 225 lb 8 oz (102.3 kg)      Head and neck exam shows no ocular or oral lesions. There are no palpable cervical or supraclavicular lymph nodes. Lungs are clear to percussion and auscultation bilaterally. Cardiac exam regular in rhythm with no  murmurs, rubs or bruits. Abdomen is soft. She has good bowel sounds. There is no fluid wave. There is no palpable liver or spleen tip. Back exam shows no tenderness over the spine, ribs or hips. Extremities shows no clubbing, cyanosis or edema. Neurological exam shows no focal neurological deficits. Skin exam shows no rashes, ecchymoses or petechia.   Lab Results  Component Value Date   WBC 13.1 (H) 02/28/2017   HGB 13.6 02/28/2017   HCT 39.4 02/28/2017   MCV 91 02/28/2017   PLT 283 02/28/2017     Chemistry      Component Value Date/Time   NA 139 08/30/2016 1049   K 4.2 08/30/2016 1049   CL 104 08/30/2016 1049   CO2 29 08/30/2016 1049   BUN 11 08/30/2016 1049   CREATININE 0.70 08/30/2016 1049   CREATININE 0.72 05/27/2014 0944      Component Value Date/Time   CALCIUM 9.7 08/30/2016 1049   ALKPHOS 81 08/30/2016 1049   AST 20 08/30/2016 1049   ALT 30 08/30/2016 1049   BILITOT 0.7 08/30/2016 1049         Impression and Plan: Erika Weaver is A 41 year old white female. She has chronic middle leukocytosis.  Under the microscope, I do not see anything that looks suspicious for a hematologic issue. She has a normal white cell differential.  I still would like to follow her along. I  do not think we have to do any genetic studies, such as JAK2. I just think that the leukocytosis is reactive.  For now, we will plan to have her come back in 4 months. I think everything looks okay in 4 months, and we will be able to let her go from the clinic.  Volanda Napoleon, MD 5/10/20181:13 PM

## 2017-02-28 NOTE — Progress Notes (Signed)
Pre visit review using our clinic review tool, if applicable. No additional management support is needed unless otherwise documented below in the visit note. 

## 2017-02-28 NOTE — Assessment & Plan Note (Signed)
Chronic problem, adequate control today.  Asymptomatic.  Check labs.  No anticipated med changes.  Will follow.

## 2017-02-28 NOTE — Progress Notes (Signed)
   Subjective:    Patient ID: Erika Weaver, female    DOB: [birth year], 41 y.o.   MRN: [id number]  HPI HTN- chronic problem, on Amlodipine 92m daily w/ adequate control.  Denies CP, SOB, HAs, visual changes, edema.  Obesity- ongoing issue for pt.  BMI 37.6.  Pt is having difficulty exercising due to ongoing lightheadedness.  Pt has increased her water intake and trying to cut back on Dr PMalachi Bonds   Review of Systems For ROS see HPI     Objective:   Physical Exam  Constitutional: She is oriented to person, place, and time. She appears well-developed and well-nourished. No distress.  HENT:  Head: Normocephalic and atraumatic.  Eyes: Conjunctivae and EOM are normal. Pupils are equal, round, and reactive to light.  Neck: Normal range of motion. Neck supple. No thyromegaly present.  Cardiovascular: Normal rate, regular rhythm, normal heart sounds and intact distal pulses.   No murmur heard. Pulmonary/Chest: Effort normal and breath sounds normal. No respiratory distress.  Abdominal: Soft. She exhibits no distension. There is no tenderness.  Musculoskeletal: She exhibits no edema.  Lymphadenopathy:    She has no cervical adenopathy.  Neurological: She is alert and oriented to person, place, and time.  Skin: Skin is warm and dry.  Psychiatric: She has a normal mood and affect. Her behavior is normal.  Vitals reviewed.         Assessment & Plan:

## 2017-02-28 NOTE — Assessment & Plan Note (Signed)
Ongoing issue for pt.  She is working on [employer] and trying to walk more regularly- applauded her efforts.  Check labs to risk stratify.  Will follow.

## 2017-02-28 NOTE — Telephone Encounter (Signed)
I have spoken with Erika Weaver this afternoon and explained that our office has tried twice to get MRA approved--insurance still denies coverage.  There is not any new information at this time to give insurance to get them to approve study.  She verbalized understanding of same/fim

## 2017-02-28 NOTE — Telephone Encounter (Signed)
Patient called office in reference to denial for MRA patient went to see her Medical Dr stressing patient needs this MRI done and needs to keep fighting with the insurance.  Hematologist advised patient to contact our office to see about coding different for the insurance to approve it.  Please call

## 2017-02-28 NOTE — Telephone Encounter (Signed)
Patient called with information for an appeal for MRA.  Case #- [id number] phone number [phone].

## 2017-02-28 NOTE — Telephone Encounter (Signed)
I have spoken with pt. about this/fim

## 2017-02-28 NOTE — Patient Instructions (Signed)
Schedule your complete physical in 6 months We'll notify you of your lab results and make any changes if needed Continue to work on healthy diet and regular exercise- you can do it! Call with any questions or concerns Have a great summer!!!

## 2017-03-01 ENCOUNTER — Encounter: Payer: Self-pay | Admitting: General Practice

## 2017-03-28 ENCOUNTER — Ambulatory Visit (INDEPENDENT_AMBULATORY_CARE_PROVIDER_SITE_OTHER): Payer: Managed Care, Other (non HMO) | Admitting: Family Medicine

## 2017-03-28 ENCOUNTER — Encounter: Payer: Self-pay | Admitting: Family Medicine

## 2017-03-28 VITALS — BP 132/84 | HR 87 | Temp 98.3°F | Resp 18

## 2017-03-28 DIAGNOSIS — R1013 Epigastric pain: Secondary | ICD-10-CM

## 2017-03-28 MED ORDER — PANTOPRAZOLE SODIUM 40 MG PO TBEC: 40.0000 mg | DELAYED_RELEASE_TABLET | Freq: Every day | ORAL | 3 refills | Status: DC

## 2017-03-28 MED ORDER — GI COCKTAIL ~~LOC~~
30.0000 mL | Freq: Once | ORAL | Status: AC
  Administered 2017-03-28: 30 mL via ORAL

## 2017-03-28 NOTE — Progress Notes (Signed)
   Subjective:    Patient ID: Erika Weaver, female    DOB: 29-Mar-1976, 41 y.o.   MRN: [id number]  HPI Epigastric pain- pt walked into office today complaining of epigastric pain radiating through to her back.  Pt reports she woke at 5am w/ sxs today.  Pt had some R arm pain that started later in the day but pt feels this is not related.  Took 4 tums, 2 pepto, and 2 gas-x w/o relief.  Took 1 omeprazole w/o relief.  No nausea.  No SOB.  No diarrhea.  No medication changes.  Pt reports 3 days of 'terrible eating'- yesterday had [place] for lunch, [employer] cheeseburger just prior to bed, chinese food for lunch today.  Some improvement w/ GI cocktail.   Review of Systems For ROS see HPI     Objective:   Physical Exam  Constitutional: She is oriented to person, place, and time. She appears well-developed and well-nourished. No distress.  HENT:  Head: Normocephalic and atraumatic.  Eyes: Conjunctivae and EOM are normal. Pupils are equal, round, and reactive to light.  Neck: Normal range of motion. Neck supple. No thyromegaly present.  Cardiovascular: Normal rate, regular rhythm, normal heart sounds and intact distal pulses.   No murmur heard. Pulmonary/Chest: Effort normal and breath sounds normal. No respiratory distress.  Abdominal: Soft. She exhibits no distension. There is tenderness (mild epigastric TTP).  Musculoskeletal: She exhibits no edema.  Lymphadenopathy:    She has no cervical adenopathy.  Neurological: She is alert and oriented to person, place, and time.  Skin: Skin is warm and dry.  Psychiatric: She has a normal mood and affect. Her behavior is normal.  Vitals reviewed.         Assessment & Plan:  Epigastric pain- new.  Pt's sxs are consistent w/ increased acid production/gastritis/GERD.  No CP, SOB, N/V, diaphoresis.  Pt's vitals are stable and she's in no distress- making aneurysm very unlikely after having sxs all day.  Sxs improved w/ GI cocktail in office.   Reviewed need to make dietary and lifestyle changes.  Start PPI.  Reviewed supportive care and red flags that should prompt return.  Pt expressed understanding and is in agreement w/ plan.

## 2017-03-28 NOTE — Patient Instructions (Addendum)
Follow up as needed/scheduled Start the Protonix once daily for increased acid production Try and avoid spicy or acidic food- this will flare your reflux Avoid eating prior to bed or lying down If symptoms change or worsen- please call or go to the [place] in there!!!

## 2017-03-29 ENCOUNTER — Encounter (HOSPITAL_COMMUNITY): Payer: Self-pay | Admitting: Emergency Medicine

## 2017-03-29 ENCOUNTER — Emergency Department (HOSPITAL_COMMUNITY)
Admission: EM | Admit: 2017-03-29 | Discharge: 2017-03-29 | Disposition: A | Discharge status: Home / Self Care | Payer: Managed Care, Other (non HMO) | Attending: Emergency Medicine | Admitting: Emergency Medicine

## 2017-03-29 ENCOUNTER — Emergency Department (HOSPITAL_COMMUNITY): Payer: Managed Care, Other (non HMO)

## 2017-03-29 DIAGNOSIS — I129 Hypertensive chronic kidney disease with stage 1 through stage 4 chronic kidney disease, or unspecified chronic kidney disease: Secondary | ICD-10-CM | POA: Insufficient documentation

## 2017-03-29 DIAGNOSIS — Z79899 Other long term (current) drug therapy: Secondary | ICD-10-CM | POA: Insufficient documentation

## 2017-03-29 DIAGNOSIS — K209 Esophagitis, unspecified without bleeding: Secondary | ICD-10-CM

## 2017-03-29 DIAGNOSIS — R079 Chest pain, unspecified: Secondary | ICD-10-CM

## 2017-03-29 DIAGNOSIS — N189 Chronic kidney disease, unspecified: Secondary | ICD-10-CM | POA: Insufficient documentation

## 2017-03-29 DIAGNOSIS — R0789 Other chest pain: Secondary | ICD-10-CM | POA: Diagnosis present

## 2017-03-29 LAB — CBC WITH DIFFERENTIAL/PLATELET
Basophils Absolute: 0 10*3/uL (ref 0.0–0.1)
Basophils Relative: 0 %
EOS ABS: 0.2 10*3/uL (ref 0.0–0.7)
Eosinophils Relative: 2 %
HCT: 38.7 % (ref 36.0–46.0)
HEMOGLOBIN: 13.5 g/dL (ref 12.0–15.0)
LYMPHS ABS: 2.6 10*3/uL (ref 0.7–4.0)
LYMPHS PCT: 21 %
MCH: 31 pg (ref 26.0–34.0)
MCHC: 34.9 g/dL (ref 30.0–36.0)
MCV: 88.8 fL (ref 78.0–100.0)
MONOS PCT: 8 %
Monocytes Absolute: 1 10*3/uL (ref 0.1–1.0)
NEUTROS PCT: 69 %
Neutro Abs: 8.9 10*3/uL — ABNORMAL HIGH (ref 1.7–7.7)
Platelets: 276 10*3/uL (ref 150–400)
RBC: 4.36 MIL/uL (ref 3.87–5.11)
RDW: 12.4 % (ref 11.5–15.5)
WBC: 12.7 10*3/uL — ABNORMAL HIGH (ref 4.0–10.5)

## 2017-03-29 LAB — D-DIMER, QUANTITATIVE (NOT AT ARMC)

## 2017-03-29 LAB — I-STAT BETA HCG BLOOD, ED (MC, WL, AP ONLY): I-stat hCG, quantitative: <5 m[IU]/mL (ref <5)

## 2017-03-29 LAB — COMPREHENSIVE METABOLIC PANEL
ALBUMIN: 3.5 g/dL (ref 3.5–5.0)
ALK PHOS: 70 U/L (ref 38–126)
ALT: 21 U/L (ref 14–54)
ANION GAP: 8 (ref 5–15)
AST: 17 U/L (ref 15–41)
BUN: 11 mg/dL (ref 6–20)
CHLORIDE: 106 mmol/L (ref 101–111)
CO2: 24 mmol/L (ref 22–32)
CREATININE: 0.74 mg/dL (ref 0.44–1.00)
Calcium: 9.1 mg/dL (ref 8.9–10.3)
GFR calc Af Amer: >60 mL/min (ref >60)
GFR calc non Af Amer: >60 mL/min (ref >60)
GLUCOSE: 115 mg/dL — AB (ref 65–99)
Potassium: 3.9 mmol/L (ref 3.5–5.1)
SODIUM: 138 mmol/L (ref 135–145)
Total Bilirubin: 0.9 mg/dL (ref 0.3–1.2)
Total Protein: 7.5 g/dL (ref 6.5–8.1)

## 2017-03-29 LAB — LIPASE, BLOOD: Lipase: 29 U/L (ref 11–51)

## 2017-03-29 LAB — I-STAT TROPONIN, ED: TROPONIN I, POC: 0 ng/mL (ref 0.00–0.08)

## 2017-03-29 MED ORDER — NITROGLYCERIN 0.4 MG SL SUBL
0.4000 mg | SUBLINGUAL_TABLET | SUBLINGUAL | Status: AC | PRN
  Administered 2017-03-29 (×3): 0.4 mg via SUBLINGUAL
  Filled 2017-03-29: qty 1

## 2017-03-29 MED ORDER — ASPIRIN 81 MG PO CHEW
324.0000 mg | CHEWABLE_TABLET | Freq: Once | ORAL | Status: AC
  Administered 2017-03-29: 324 mg via ORAL
  Filled 2017-03-29: qty 4

## 2017-03-29 MED ORDER — FAMOTIDINE 20 MG PO TABS
40.0000 mg | ORAL_TABLET | Freq: Once | ORAL | Status: AC
  Administered 2017-03-29: 40 mg via ORAL
  Filled 2017-03-29: qty 2

## 2017-03-29 MED ORDER — SUCRALFATE 1 G PO TABS: 1.0000 g | ORAL_TABLET | Freq: Four times a day (QID) | ORAL | 0 refills | Status: DC

## 2017-03-29 MED ORDER — DICYCLOMINE HCL 10 MG PO CAPS
10.0000 mg | ORAL_CAPSULE | Freq: Once | ORAL | Status: AC
  Administered 2017-03-29: 10 mg via ORAL
  Filled 2017-03-29: qty 1

## 2017-03-29 MED ORDER — GI COCKTAIL ~~LOC~~
30.0000 mL | Freq: Once | ORAL | Status: AC
  Administered 2017-03-29: 30 mL via ORAL
  Filled 2017-03-29: qty 30

## 2017-03-29 MED ORDER — SUCRALFATE 1 G PO TABS
1.0000 g | ORAL_TABLET | Freq: Once | ORAL | Status: AC
  Administered 2017-03-29: 1 g via ORAL
  Filled 2017-03-29: qty 1

## 2017-03-29 NOTE — ED Provider Notes (Signed)
[facility] DEPT Provider Note   CSN: [id number] Arrival date & time: 03/29/17  2952     History   Chief Complaint Chief Complaint  Patient presents with  . Nasal Congestion  . Back Pain    HPI Erika Weaver is a 41 y.o. female.  The history is provided by the patient.  She woke up yesterday morning at 5 AM with a sharp pain across the subcostal area. Pain was constant. It was worse with deep breath, and she did notice some slight dyspnea. She saw her PCP who gave her a GI cocktail which gave some partial relief and sent her home with prescription for pantoprazole. Pain never went away, and woke her up again this morning. Pain is now spreading to the interscapular area. There was also some intermittent radiation of pain down the right arm. She denies nausea, vomiting, diaphoresis. Pain is not affected by body position, eating, exertion. She has noted nasal congestion, but states that she thinks it is because she has been crying because the pain has been so severe. She currently rates her pain at 10/10. She has not had pain like this previously. She does have history of hypertension, but denies history of diabetes, hyperlipidemia. She is a nonsmoker. There is no family history of premature coronary atherosclerosis. She denies any recent surgery or long distance travel. She has not taking exogenous estrogens. There is no history of DVT or pulmonary embolism.  Past Medical History:  Diagnosis Date  . Allergic state 11/22/2011  . Anxiety 11/22/2011  . Back pain 01/31/2013  . BPV (benign positional vertigo) 08/23/2013  . Chicken pox as a child  . Chronic kidney disease    kidney stones  . Dehydration 01/31/2013  . Elevated BP 11/22/2011  . Fatigue 01/11/2012  . Headache(784.0) 11/22/2011  . Hypertension   . Kidney stone 12/21/2012  . Other and unspecified hyperlipidemia 03/07/2014  . Overweight(278.02)   . Pharyngitis 01/11/2012  . Plantar fasciitis 01/11/2012  . Preventative health care  08/18/2013  . 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 Sinusitis 11/22/2011  . Sleep apnea 04/03/2012   setting 2  . Visual changes 11/22/2011    Patient Active Problem List   Diagnosis Date Noted  . Generalized abdominal pain 02/16/2016  . Dizziness 12/29/2015  . Other headache syndrome 12/29/2015  . Retrosternal chest pain 11/08/2015  . GERD (gastroesophageal reflux disease) 11/08/2015  . Maxillary sinusitis, acute 10/21/2015  . Essential hypertension 09/09/2014  . Hyperlipidemia, mixed 03/07/2014  . BPV (benign positional vertigo) 08/23/2013  . Preventative health care 08/18/2013  . Bartholin cyst 03/01/2013  . Back pain 01/31/2013  . 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 Kidney stone 12/21/2012  . Sleep apnea 04/03/2012  . Plantar fasciitis 01/11/2012  . Fatigue 01/11/2012  . Headache 11/22/2011  . Allergic state 11/22/2011  . Anxiety 11/22/2011  . Obesity (BMI 35.0-39.9 without comorbidity)     Past Surgical History:  Procedure Laterality Date  . GUM SURGERY    . PERINEAL BODY REPAIR     4 th degree tear with fistula between vagina and retum requiring 3 surgeries for correction  . rectal fistula repair     x 2  . WISDOM TOOTH EXTRACTION      OB History    No data available       Home Medications    Prior to Admission medications   Medication Sig Start Date End Date Taking? Authorizing Provider  amLODipine (NORVASC) 5 MG tablet Take 1 tablet (5  mg total) by mouth daily. 03/22/16   Mosie Lukes, MD  Cholecalciferol (VITAMIN D3) 2000 units TABS Take 1 tablet by mouth daily.    [provider]  pantoprazole (PROTONIX) 40 MG tablet Take 1 tablet (40 mg total) by mouth daily. 03/28/17   Midge Minium, MD  SALINE NASAL MIST NA Place 1 spray into the nose as directed.     [provider]    Family History Family History  Problem Relation Age of Onset  . Diabetes Mother        type 2  . Hypertension Mother   . Hyperlipidemia Mother   .  Hypertension Father   . Hyperlipidemia Father   . Aneurysm Father        abdominal aorta  . Cancer Maternal Grandmother 82       colon, ovarian  . Other Maternal Grandfather 41       brain hemorrhage  . Stroke Maternal Grandfather   . Aneurysm Maternal Grandfather   . Heart attack Paternal Grandmother   . Heart disease Paternal Grandmother        MI  . Alzheimer's disease Paternal Grandfather   . Dementia Paternal Grandfather   . Heart attack Paternal Aunt   . Heart disease Paternal Aunt         AAA rupture  . Other Paternal Uncle        cerebreal brain hemorrhage  . Stroke Paternal Uncle   . Aneurysm Paternal Uncle   . Asthma Daughter   . Other Daughter        peanut allergy  . Heart disease Maternal Aunt        s/p cabg. brain aneurysm s/p coiling  . Aneurysm Maternal Aunt        coil behind eye    Social History Social History  Substance Use Topics  . Smoking status: Never Smoker  . Smokeless tobacco: Never Used  . Alcohol use No     Allergies   Patient has no known allergies.   Review of Systems Review of Systems  All other systems reviewed and are negative.    Physical Exam Updated Vital Signs BP (!) 157/94 (BP Location: Right Arm)   Pulse 86   Temp 98.1 F (36.7 C) (Oral)   Resp 20   Ht 5' 6"  (1.676 m)   Wt 102.1 kg (225 lb)   SpO2 100%   BMI 36.32 kg/m   Physical Exam  Nursing note and vitals reviewed.  Obese 41 year old female, resting comfortably and in no acute distress. Vital signs are significant for hypertension. Oxygen saturation is 100%, which is normal. Head is normocephalic and atraumatic. PERRLA, EOMI. Oropharynx is clear. Neck is nontender and supple without adenopathy or JVD. Back has very mild tenderness in the left interscapular area. There is no CVA tenderness. Lungs are clear without rales, wheezes, or rhonchi. Chest is nontender. Heart has regular rate and rhythm without murmur. Abdomen is soft, flat, nontender without  masses or hepatosplenomegaly and peristalsis is normoactive. Extremities have 1+ edema, full range of motion is present. Skin is warm and dry without rash. Neurologic: Mental status is normal, cranial nerves are intact, there are no motor or sensory deficits.  ED Treatments / Results  Labs (all labs ordered are listed, but only abnormal results are displayed) Labs Reviewed  CBC WITH DIFFERENTIAL/PLATELET - Abnormal; Notable for the following:       Result Value   WBC 12.7 (*)  Neutro Abs 8.9 (*)    All other components within normal limits  COMPREHENSIVE METABOLIC PANEL  LIPASE, BLOOD  I-STAT TROPOININ, ED  I-STAT BETA HCG BLOOD, ED ([facility], [facility], [facility] ONLY)    EKG ECG shows normal sinus rhythm with a rate of 73, no ectopy. Normal axis. Normal P wave. Normal QRS. Normal intervals. Normal ST and T waves. Impression: normal ECG. Compared with ECG of 01/15/2017, no significant changes are seen.  Radiology Dg Chest 2 View  Result Date: 03/29/2017 CLINICAL DATA:  Chest pain and short of breath since yesterday EXAM: CHEST  2 VIEW COMPARISON:  11/26/2014 FINDINGS: Normal heart size. Lungs clear. No pneumothorax. No pleural effusion. IMPRESSION: No active cardiopulmonary disease. Electronically Signed   By: Marybelle Killings M.D.   On: 03/29/2017 07:09    Procedures Procedures (including critical care time)  Medications Ordered in ED Medications  aspirin chewable tablet 324 mg (not administered)  nitroGLYCERIN (NITROSTAT) SL tablet 0.4 mg (not administered)  gi cocktail (Maalox,Lidocaine,Donnatal) (30 mLs Oral Given 03/29/17 0709)     Initial Impression / Assessment and Plan / ED Course  I have reviewed the triage vital signs and the nursing notes.  Pertinent labs & imaging results that were available during my care of the patient were reviewed by me and considered in my medical decision making (see chart for details).  Chest pain of uncertain cause. Her only cardiac risk factor is  hypertension. She is PERC negative. Old records are reviewed, and she had cardiology evaluation because of dizziness. Echocardiogram showed evidence of left ventricular hypertrophy, and cardiac event monitor showed occasional PVCs. Since she did get partial relief of pain with a GI cocktail yesterday, I will give another trial of same. Heart score is 1 which puts her at extremely low risk of major adverse cardiac events in the next 30 days. She has no risk factors for pulmonary embolism. Screening labs, ECG, x-ray are ordered.  ECG is normal. Chest x-ray is normal. She had no relief whatsoever with GI cocktail, and now states the pain is more of a heavy feeling. She will be given a trial of nitroglycerin and is given aspirin. Metabolic panel is pending as is lipase. Case is signed out to Dr. Zenia Resides.  Final Clinical Impressions(s) / ED Diagnoses   Final diagnoses:  Chest pain, unspecified type    New Prescriptions New Prescriptions   No medications on file     Delora Fuel, MD 16/10/96 [phone]

## 2017-03-29 NOTE — ED Notes (Signed)
Pt is alert and oriented x 4 and is c/o 10/10 back pain to upper rt side of back described as sharp. Pt family is at bedside, PT placd on cardiac monitor, NSR

## 2017-03-29 NOTE — ED Notes (Signed)
Patient transported to X-ray 

## 2017-03-29 NOTE — ED Provider Notes (Addendum)
8:15 AM Patient signed out to me by Dr. Roxanne Mins and has noted no improvement from GI cocktail. Patient endorses epigastric pain that goes through to her back that is worse with taking deep breath. Will medicate further as well as obtain a d-dimer.  9:17 AM Patient d-dimer negative. Feels better after being medicated. Stable for discharge   Lacretia Leigh, MD 03/29/17 Verdie Shire    Lacretia Leigh, MD 03/29/17 [phone]

## 2017-03-29 NOTE — ED Triage Notes (Signed)
Patient is complaining of back upper right back pain. Patient is congested. She says that the doctor told her yesterday that it was gas. Patient is still in a lot of pain.

## 2017-04-01 ENCOUNTER — Telehealth: Payer: Self-pay | Admitting: *Deleted

## 2017-04-01 DIAGNOSIS — R1084 Generalized abdominal pain: Secondary | ICD-10-CM

## 2017-04-01 NOTE — Telephone Encounter (Signed)
Since pt's pain is GI related, the Ibuprofen is likely worsening her pain rather than helping.  We can order an abdominal US for ongoing abdominal pain but she needs to be taking her Protonix, her carafate and paying close attention to her diet.  STOP ibuprofen or other anti-inflammatories

## 2017-04-01 NOTE — Telephone Encounter (Signed)
Pt's mother is calling stating that Erika Weaver is still having a lot of pain, she has been to the hospital again (friday) and they cleared her for any cardiac issues, she is asking if there is any way we could sent her to [facility] for an [place] of her gallbladder.   This is the only thing that has not been checked and they are really worried with the pain that she is in.   Pt's mother states that all weekend she has been living on ibuprofen every 6 hours and it is only giving minimal relief.   Routed to provider to advise.

## 2017-04-01 NOTE — Telephone Encounter (Signed)
Pt made aware of PCP recommendations, stated an understanding even though she feels no longer reflux related. Pt spoke of moving abd pain and right sided shoulder pain.   [place] ordered

## 2017-04-03 ENCOUNTER — Ambulatory Visit (HOSPITAL_BASED_OUTPATIENT_CLINIC_OR_DEPARTMENT_OTHER)
Admission: RE | Admit: 2017-04-03 | Discharge: 2017-04-03 | Disposition: A | Discharge status: Home / Self Care | Payer: Managed Care, Other (non HMO) | Source: Ambulatory Visit | Attending: Family Medicine | Admitting: Family Medicine

## 2017-04-03 DIAGNOSIS — K760 Fatty (change of) liver, not elsewhere classified: Secondary | ICD-10-CM | POA: Insufficient documentation

## 2017-04-03 DIAGNOSIS — R1084 Generalized abdominal pain: Secondary | ICD-10-CM | POA: Diagnosis not present

## 2017-04-12 ENCOUNTER — Other Ambulatory Visit: Payer: Self-pay | Admitting: Family Medicine

## 2017-04-12 DIAGNOSIS — Z1231 Encounter for screening mammogram for malignant neoplasm of breast: Secondary | ICD-10-CM

## 2017-04-17 ENCOUNTER — Other Ambulatory Visit: Payer: Self-pay | Admitting: Family Medicine

## 2017-04-18 ENCOUNTER — Other Ambulatory Visit: Payer: Self-pay | Admitting: Physician Assistant

## 2017-04-18 ENCOUNTER — Encounter: Payer: Self-pay | Admitting: Physician Assistant

## 2017-04-18 ENCOUNTER — Ambulatory Visit (HOSPITAL_BASED_OUTPATIENT_CLINIC_OR_DEPARTMENT_OTHER)
Admission: RE | Admit: 2017-04-18 | Discharge: 2017-04-18 | Disposition: A | Discharge status: Home / Self Care | Payer: Managed Care, Other (non HMO) | Source: Ambulatory Visit | Attending: Physician Assistant | Admitting: Physician Assistant

## 2017-04-18 ENCOUNTER — Ambulatory Visit (INDEPENDENT_AMBULATORY_CARE_PROVIDER_SITE_OTHER): Payer: Managed Care, Other (non HMO) | Admitting: Physician Assistant

## 2017-04-18 VITALS — BP 120/84 | HR 70 | Temp 98.2°F | Resp 14 | Ht 66.0 in | Wt 222.0 lb

## 2017-04-18 DIAGNOSIS — R109 Unspecified abdominal pain: Secondary | ICD-10-CM

## 2017-04-18 DIAGNOSIS — Z87442 Personal history of urinary calculi: Secondary | ICD-10-CM | POA: Diagnosis present

## 2017-04-18 DIAGNOSIS — N132 Hydronephrosis with renal and ureteral calculous obstruction: Secondary | ICD-10-CM | POA: Insufficient documentation

## 2017-04-18 DIAGNOSIS — R319 Hematuria, unspecified: Secondary | ICD-10-CM | POA: Diagnosis not present

## 2017-04-18 DIAGNOSIS — M5137 Other intervertebral disc degeneration, lumbosacral region: Secondary | ICD-10-CM | POA: Insufficient documentation

## 2017-04-18 LAB — POCT URINALYSIS DIPSTICK
Bilirubin, UA: NEGATIVE
Glucose, UA: NEGATIVE
Ketones, UA: NEGATIVE
Leukocytes, UA: NEGATIVE
Nitrite, UA: NEGATIVE
Spec Grav, UA: >=1.03 — AB
Urobilinogen, UA: 0.2 U/dL
pH, UA: 6

## 2017-04-18 MED ORDER — TRAMADOL HCL 50 MG PO TABS: 50.0000 mg | ORAL_TABLET | Freq: Three times a day (TID) | ORAL | 0 refills | Status: DC | PRN

## 2017-04-18 NOTE — Patient Instructions (Signed)
Please stop at the front desk to speak with Erika Weaver.  We are setting you up for a CT scan to assess for kidney stone. I will call you with your results.  If there is any acute worsening of pain before this can be completed, please go to the ER

## 2017-04-18 NOTE — Progress Notes (Signed)
Please see Tramadol Rx -- Please fax in for patient.  This is for renal stone until she sees Urology today or in the morning.

## 2017-04-18 NOTE — Progress Notes (Signed)
Pre visit review using our clinic review tool, if applicable. No additional management support is needed unless otherwise documented below in the visit note. 

## 2017-04-18 NOTE — Progress Notes (Signed)
Patient presents to clinic today c/o R flank pain associated with mild nausea starting around 2 am this morning. Pain is throbbing and intermittent. Does not radiate elsewhere. Patient denies vomiting, constipation, diarrhea, melena or hematochezia. Has history of GERD, currently on PPI therapy and without breakthrough symptoms. Patient denies change to urination. Patient with history of nephrolithiasis, stating this pain feels similar to prior episodes. Patient finished her menstrual period last week. Denies concerns of pregnancy.  Past Medical History:  Diagnosis Date  . Allergic state 11/22/2011  . Anxiety 11/22/2011  . Back pain 01/31/2013  . BPV (benign positional vertigo) 08/23/2013  . Chicken pox as a child  . Chronic kidney disease    kidney stones  . Dehydration 01/31/2013  . Elevated BP 11/22/2011  . Fatigue 01/11/2012  . Headache(784.0) 11/22/2011  . Hypertension   . Kidney stone 12/21/2012  . Other and unspecified hyperlipidemia 03/07/2014  . Overweight(278.02)   . Pharyngitis 01/11/2012  . Plantar fasciitis 01/11/2012  . Preventative health care 08/18/2013  . 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 Sinusitis 11/22/2011  . Sleep apnea 04/03/2012   setting 2  . Visual changes 11/22/2011    Current Outpatient Prescriptions on File Prior to Visit  Medication Sig Dispense Refill  . amLODipine (NORVASC) 5 MG tablet Take 1 tablet (5 mg total) by mouth daily. 90 tablet 3  . Cholecalciferol (VITAMIN D3) 2000 units TABS Take 1 tablet by mouth daily.    . pantoprazole (PROTONIX) 40 MG tablet Take 1 tablet (40 mg total) by mouth daily. 30 tablet 3  . sucralfate (CARAFATE) 1 g tablet Take 1 tablet (1 g total) by mouth 4 (four) times daily. (Patient not taking: Reported on 04/18/2017) 30 tablet 0   No current facility-administered medications on file prior to visit.     No Known Allergies  Family History  Problem Relation Age of Onset  . Diabetes Mother        type 2  . Hypertension Mother    . Hyperlipidemia Mother   . Hypertension Father   . Hyperlipidemia Father   . Aneurysm Father        abdominal aorta  . Cancer Maternal Grandmother 82       colon, ovarian  . Other Maternal Grandfather 41       brain hemorrhage  . Stroke Maternal Grandfather   . Aneurysm Maternal Grandfather   . Heart attack Paternal Grandmother   . Heart disease Paternal Grandmother        MI  . Alzheimer's disease Paternal Grandfather   . Dementia Paternal Grandfather   . Heart attack Paternal Aunt   . Heart disease Paternal Aunt         AAA rupture  . Other Paternal Uncle        cerebreal brain hemorrhage  . Stroke Paternal Uncle   . Aneurysm Paternal Uncle   . Asthma Daughter   . Other Daughter        peanut allergy  . Heart disease Maternal Aunt        s/p cabg. brain aneurysm s/p coiling  . Aneurysm Maternal Aunt        coil behind eye    Social History   Social History  . Marital status: Married    Spouse name: N/A  . Number of children: N/A  . Years of education: N/A   Social History Main Topics  . Smoking status: Never Smoker  . Smokeless tobacco: Never Used  . Alcohol  use No  . Drug use: No  . Sexual activity: Yes    Partners: Male    Birth control/ protection: None     Comment: lives with husband, daughter, no dietary restrictions   Other Topics Concern  . None   Social History Narrative  . None    Review of Systems - See HPI.  All other ROS are negative.  BP 120/84   Pulse 70   Temp 98.2 F (36.8 C) (Oral)   Resp 14   Ht _0  (1.676 m)   Wt 222 lb (100.7 kg)   LMP 03/30/2017   SpO2 99%   BMI 35.83 kg/m   Physical Exam  Constitutional: She is oriented to person, place, and time.  Cardiovascular: Normal rate, regular rhythm, normal heart sounds and intact distal pulses.   Pulmonary/Chest: Effort normal and breath sounds normal. No respiratory distress. She has no wheezes. She has no rales. She exhibits no tenderness.  Abdominal: Soft. Bowel  sounds are normal. She exhibits no distension and no mass. There is no tenderness. There is no rebound and no guarding.  Negative CVA tenderness.  Musculoskeletal:       Thoracic back: Normal.       Lumbar back: Normal.  Full ROM of thoracolumbar spine. Pain is not affected by this ROM.  Neurological: She is alert and oriented to person, place, and time. No cranial nerve deficit.  Skin: Skin is warm and dry. No rash noted.  Psychiatric: Affect normal.  Vitals reviewed.   Recent Results (from the past 2160 hour(s))  Lipid panel     Status: Abnormal   Collection Time: 02/28/17  9:19 AM  Result Value Ref Range   Cholesterol 182 0 - 200 mg/dL    Comment: ATP III Classification       Desirable:  < 200 mg/dL               Borderline High:  200 - 239 mg/dL          High:  > = 240 mg/dL   Triglycerides 100.0 0.0 - 149.0 mg/dL    Comment: Normal:  <150 mg/dLBorderline High:  150 - 199 mg/dL   HDL 42.20 >39.00 mg/dL   VLDL 20.0 0.0 - 40.0 mg/dL   LDL Cholesterol 120 (H) 0 - 99 mg/dL   Total CHOL/HDL Ratio 4     Comment:                Men          Women1/2 Average Risk     3.4          3.3Average Risk          5.0          4.42X Average Risk          9.6          7.13X Average Risk          15.0          11.0                       NonHDL 139.53     Comment: NOTE:  Non-HDL goal should be 30 mg/dL higher than patient's LDL goal (i.e. LDL goal of < 70 mg/dL, would have non-HDL goal of < 100 mg/dL)  Basic metabolic panel     Status: None   Collection Time: 02/28/17  9:19 AM  Result Value Ref Range  Sodium 138 135 - 145 mEq/L   Potassium 4.5 3.5 - 5.1 mEq/L   Chloride 104 96 - 112 mEq/L   CO2 28 19 - 32 mEq/L   Glucose, Bld 91 70 - 99 mg/dL   BUN 10 6 - 23 mg/dL   Creatinine, Ser 0.70 0.40 - 1.20 mg/dL   Calcium 9.2 8.4 - 10.5 mg/dL   GFR 97.29 >60.00 mL/min  Hepatic function panel     Status: None   Collection Time: 02/28/17  9:19 AM  Result Value Ref Range   Total Bilirubin 0.5 0.2 -  1.2 mg/dL   Bilirubin, Direct 0.1 0.0 - 0.3 mg/dL   Alkaline Phosphatase 72 39 - 117 U/L   AST 17 0 - 37 U/L   ALT 23 0 - 35 U/L   Total Protein 7.3 6.0 - 8.3 g/dL   Albumin 3.9 3.5 - 5.2 g/dL  TSH     Status: None   Collection Time: 02/28/17  9:19 AM  Result Value Ref Range   TSH 1.49 0.35 - 4.50 uIU/mL  CBC with Differential/Platelet     Status: Abnormal   Collection Time: 02/28/17  9:19 AM  Result Value Ref Range   WBC 11.3 (H) 4.0 - 10.5 K/uL   RBC 4.45 3.87 - 5.11 Mil/uL   Hemoglobin 13.9 12.0 - 15.0 g/dL   HCT 41.1 36.0 - 46.0 %   MCV 92.5 78.0 - 100.0 fl   MCHC 33.7 30.0 - 36.0 g/dL   RDW 13.0 11.5 - 15.5 %   Platelets 302.0 150.0 - 400.0 K/uL   Neutrophils Relative % 64.8 43.0 - 77.0 %   Lymphocytes Relative 27.0 12.0 - 46.0 %   Monocytes Relative 5.8 3.0 - 12.0 %   Eosinophils Relative 1.4 0.0 - 5.0 %   Basophils Relative 1.0 0.0 - 3.0 %   Neutro Abs 7.4 1.4 - 7.7 K/uL   Lymphs Abs 3.1 0.7 - 4.0 K/uL   Monocytes Absolute 0.7 0.1 - 1.0 K/uL   Eosinophils Absolute 0.2 0.0 - 0.7 K/uL   Basophils Absolute 0.1 0.0 - 0.1 K/uL  CBC with Differential [facility])     Status: Abnormal   Collection Time: 02/28/17 11:30 AM  Result Value Ref Range   WBC 13.1 (H) 3.9 - 10.0 10e3/uL   RBC 4.34 3.70 - 5.32 10e6/uL   HGB 13.6 11.6 - 15.9 g/dL   HCT 39.4 34.8 - 46.6 %   MCV 91 81 - 101 fL   MCH 31.3 26.0 - 34.0 pg   MCHC 34.5 32.0 - 36.0 g/dL   RDW 12.4 11.1 - 15.7 %   Platelets 283 145 - 400 10e3/uL   NEUT# 8.3 (H) 1.5 - 6.5 10e3/uL   LYMPH# 3.6 (H) 0.9 - 3.3 10e3/uL   MONO# 1.0 (H) 0.1 - 0.9 10e3/uL   Eosinophils Absolute 0.2 0.0 - 0.5 10e3/uL   BASO# 0.1 0.0 - 0.2 10e3/uL   NEUT% 63.1 39.6 - 80.0 %   LYMPH% 27.2 14.0 - 48.0 %   MONO% 7.9 0.0 - 13.0 %   EOS% 1.4 0.0 - 7.0 %   BASO% 0.4 0.0 - 2.0 %  [facility] - Smear     Status: None   Collection Time: 02/28/17 11:30 AM  Result Value Ref Range   Smear Result Smear Available   Comprehensive metabolic panel      Status: Abnormal   Collection Time: 03/29/17  6:45 AM  Result Value Ref Range   Sodium  138 135 - 145 mmol/L   Potassium 3.9 3.5 - 5.1 mmol/L   Chloride 106 101 - 111 mmol/L   CO2 24 22 - 32 mmol/L   Glucose, Bld 115 (H) 65 - 99 mg/dL   BUN 11 6 - 20 mg/dL   Creatinine, Ser 0.74 0.44 - 1.00 mg/dL   Calcium 9.1 8.9 - 10.3 mg/dL   Total Protein 7.5 6.5 - 8.1 g/dL   Albumin 3.5 3.5 - 5.0 g/dL   AST 17 15 - 41 U/L   ALT 21 14 - 54 U/L   Alkaline Phosphatase 70 38 - 126 U/L   Total Bilirubin 0.9 0.3 - 1.2 mg/dL   GFR calc non Af Amer >60 >60 mL/min   GFR calc Af Amer >60 >60 mL/min    Comment: (NOTE) The eGFR has been calculated using the CKD EPI equation. This calculation has not been validated in all clinical situations. eGFR's persistently <60 mL/min signify possible Chronic Kidney Disease.    Anion gap 8 5 - 15  Lipase, blood     Status: None   Collection Time: 03/29/17  6:45 AM  Result Value Ref Range   Lipase 29 11 - 51 U/L  CBC with Differential     Status: Abnormal   Collection Time: 03/29/17  6:45 AM  Result Value Ref Range   WBC 12.7 (H) 4.0 - 10.5 K/uL   RBC 4.36 3.87 - 5.11 MIL/uL   Hemoglobin 13.5 12.0 - 15.0 g/dL   HCT 38.7 36.0 - 46.0 %   MCV 88.8 78.0 - 100.0 fL   MCH 31.0 26.0 - 34.0 pg   MCHC 34.9 30.0 - 36.0 g/dL   RDW 12.4 11.5 - 15.5 %   Platelets 276 150 - 400 K/uL   Neutrophils Relative % 69 %   Neutro Abs 8.9 (H) 1.7 - 7.7 K/uL   Lymphocytes Relative 21 %   Lymphs Abs 2.6 0.7 - 4.0 K/uL   Monocytes Relative 8 %   Monocytes Absolute 1.0 0.1 - 1.0 K/uL   Eosinophils Relative 2 %   Eosinophils Absolute 0.2 0.0 - 0.7 K/uL   Basophils Relative 0 %   Basophils Absolute 0.0 0.0 - 0.1 K/uL  D-dimer, quantitative (not at [facility])     Status: None   Collection Time: 03/29/17  6:53 AM  Result Value Ref Range   D-Dimer, Quant <0.27 0.00 - 0.50 ug/mL-FEU    Comment: (NOTE) At the manufacturer cut-off of 0.50 ug/mL FEU, this assay has been documented to  exclude PE with a sensitivity and negative predictive value of 97 to 99%.  At this time, this assay has not been approved by the FDA to exclude DVT/VTE. Results should be correlated with clinical presentation.   I-Stat beta hCG blood, ED     Status: None   Collection Time: 03/29/17  6:57 AM  Result Value Ref Range   I-stat hCG, quantitative <5.0 <5 mIU/mL   Comment 3            Comment:   GEST. AGE      CONC.  (mIU/mL)   <=1 WEEK        5 - 50     2 WEEKS       50 - 500     3 WEEKS       100 - 10,000     4 WEEKS     1,000 - 30,000        FEMALE AND NON-PREGNANT FEMALE:  LESS THAN 5 mIU/mL   I-stat troponin, ED     Status: None   Collection Time: 03/29/17  6:58 AM  Result Value Ref Range   Troponin i, poc 0.00 0.00 - 0.08 ng/mL   Comment 3            Comment: Due to the release kinetics of cTnI, a negative result within the first hours of the onset of symptoms does not rule out myocardial infarction with certainty. If myocardial infarction is still suspected, repeat the test at appropriate intervals.   POCT Urinalysis Dipstick     Status: Abnormal   Collection Time: 04/18/17 11:19 AM  Result Value Ref Range   Color, UA yellow    Clarity, UA clear    Glucose, UA negative    Bilirubin, UA negative    Ketones, UA negative    Spec Grav, UA >=1.030 (A) 1.010 - 1.025   Blood, UA +++    pH, UA 6.0 5.0 - 8.0   Protein, UA +    Urobilinogen, UA 0.2 0.2 or 1.0 E.U./dL   Nitrite, UA negative    Leukocytes, UA Negative Negative    Assessment/Plan: 1. Acute right flank pain Urine dip with blood. No sign of infection. Patient afebrile with good urinary output. Concern for stone giving history, examination and UA findings. Will order CT Renal Study today to assess for obstructive nephrolithiasis. Will alter regimen based on findings.  - POCT Urinalysis Dipstick - CT RENAL STONE STUDY; Future  Addendum -- CT reveals 5 mm Right sided obstructive ureterolithiasis at the UPJ.  Evidence of hydronephrosis noted. Pain medication given to take as directed. Patient scheduled to see [facility] first thing in the morning. Strict ER precautions given to patient who voiced understanding and agreement with the plan.  Leeanne Rio, PA-C

## 2017-04-18 NOTE — Progress Notes (Signed)
Medication was called in

## 2017-05-03 ENCOUNTER — Ambulatory Visit
Admission: RE | Admit: 2017-05-03 | Discharge: 2017-05-03 | Disposition: A | Discharge status: Home / Self Care | Payer: Managed Care, Other (non HMO) | Source: Ambulatory Visit | Attending: Family Medicine | Admitting: Family Medicine

## 2017-05-03 DIAGNOSIS — Z1231 Encounter for screening mammogram for malignant neoplasm of breast: Secondary | ICD-10-CM

## 2017-05-31 DIAGNOSIS — H3562 Retinal hemorrhage, left eye: Secondary | ICD-10-CM | POA: Diagnosis not present

## 2017-05-31 DIAGNOSIS — R51 Headache: Secondary | ICD-10-CM | POA: Diagnosis not present

## 2017-07-03 DIAGNOSIS — H31012 Macula scars of posterior pole (postinflammatory) (post-traumatic), left eye: Secondary | ICD-10-CM | POA: Diagnosis not present

## 2017-07-03 DIAGNOSIS — H43392 Other vitreous opacities, left eye: Secondary | ICD-10-CM | POA: Diagnosis not present

## 2017-07-03 DIAGNOSIS — H3562 Retinal hemorrhage, left eye: Secondary | ICD-10-CM | POA: Diagnosis not present

## 2017-07-04 ENCOUNTER — Ambulatory Visit (HOSPITAL_BASED_OUTPATIENT_CLINIC_OR_DEPARTMENT_OTHER): Payer: 59 | Admitting: Hematology & Oncology

## 2017-07-04 ENCOUNTER — Other Ambulatory Visit (HOSPITAL_BASED_OUTPATIENT_CLINIC_OR_DEPARTMENT_OTHER): Payer: 59

## 2017-07-04 VITALS — BP 128/69 | HR 66 | Temp 98.3°F | Resp 20 | Wt 221.0 lb

## 2017-07-04 DIAGNOSIS — D72823 Leukemoid reaction: Secondary | ICD-10-CM

## 2017-07-04 LAB — CBC WITH DIFFERENTIAL (CANCER CENTER ONLY)
BASO#: 0 10*3/uL (ref 0.0–0.2)
BASO%: 0.4 % (ref 0.0–2.0)
EOS ABS: 0.1 10*3/uL (ref 0.0–0.5)
EOS%: 1.2 % (ref 0.0–7.0)
HCT: 41.4 % (ref 34.8–46.6)
HEMOGLOBIN: 14.1 g/dL (ref 11.6–15.9)
LYMPH#: 2.6 10*3/uL (ref 0.9–3.3)
LYMPH%: 28.6 % (ref 14.0–48.0)
MCH: 31.3 pg (ref 26.0–34.0)
MCHC: 34.1 g/dL (ref 32.0–36.0)
MCV: 92 fL (ref 81–101)
MONO#: 0.7 10*3/uL (ref 0.1–0.9)
MONO%: 8 % (ref 0.0–13.0)
NEUT%: 61.8 % (ref 39.6–80.0)
NEUTROS ABS: 5.6 10*3/uL (ref 1.5–6.5)
Platelets: 303 10*3/uL (ref 145–400)
RBC: 4.5 10*6/uL (ref 3.70–5.32)
RDW: 12.3 % (ref 11.1–15.7)
WBC: 9.1 10*3/uL (ref 3.9–10.0)

## 2017-07-04 LAB — CHCC SATELLITE - SMEAR

## 2017-07-04 LAB — LACTATE DEHYDROGENASE: LDH: 121 U/L — ABNORMAL LOW (ref 125–245)

## 2017-07-04 NOTE — Progress Notes (Signed)
Hematology and Oncology Follow Up Visit  Erika Weaver [id number] Apr 03, 1976 41 y.o. 07/04/2017   Principle Diagnosis:   Leukocytosis-leukemoid reaction  Current Therapy:    Observation     Interim History:  Erika Weaver is back for follow-up. Erika Weaver problem right now that she has a kidney stone in the right kidney. This is been going on since late June. She sees urology in a couple weeks. I'm not sure why they have not gone in to get this taken out. I'm not sure why she's not had lithotripsy. She's having some pain. She's having some intermittent hematuria.  Thankfully, Erika Weaver been no fever. She's had no nausea or vomiting. She's had no diarrhea.  There's been no rashes.  She's had no headache.  Overall, Erika Weaver performance status is ECOG 0.  Medications:  Current Outpatient Prescriptions:  .  amLODipine (NORVASC) 5 MG tablet, TAKE 1 TABLET(5 MG) BY MOUTH DAILY, Disp: 90 tablet, Rfl: 0 .  tamsulosin (FLOMAX) 0.4 MG CAPS capsule, Take 0.4 mg by mouth at bedtime., Disp: , Rfl:  .  Cholecalciferol (VITAMIN D3) 2000 units TABS, Take 1 tablet by mouth daily., Disp: , Rfl:  .  pantoprazole (PROTONIX) 40 MG tablet, Take 1 tablet (40 mg total) by mouth daily. (Patient not taking: Reported on 07/04/2017), Disp: 30 tablet, Rfl: 3 .  sucralfate (CARAFATE) 1 g tablet, Take 1 tablet (1 g total) by mouth 4 (four) times daily. (Patient not taking: Reported on 04/18/2017), Disp: 30 tablet, Rfl: 0 .  traMADol (ULTRAM) 50 MG tablet, Take 1 tablet (50 mg total) by mouth every 8 (eight) hours as needed. (Patient not taking: Reported on 07/04/2017), Disp: 30 tablet, Rfl: 0  Allergies: No Known Allergies  Past Medical History, Surgical history, Social history, and Family History were reviewed and updated.  Review of Systems: As stated in the interim history   Physical Exam:  weight is 221 lb (100.2 kg). Erika Weaver oral temperature is 98.3 F (36.8 C). Erika Weaver blood pressure is 128/69 and Erika Weaver pulse is 66. Erika Weaver  respiration is 20 and oxygen saturation is 100%.   Wt Readings from Last 3 Encounters:  07/04/17 221 lb (100.2 kg)  04/18/17 222 lb (100.7 kg)  03/29/17 225 lb (102.1 kg)      Physical Exam .   Lab Results  Component Value Date   WBC 9.1 07/04/2017   HGB 14.1 07/04/2017   HCT 41.4 07/04/2017   MCV 92 07/04/2017   PLT 303 07/04/2017     Chemistry      Component Value Date/Time   NA 138 03/29/2017 0645   K 3.9 03/29/2017 0645   CL 106 03/29/2017 0645   CO2 24 03/29/2017 0645   BUN 11 03/29/2017 0645   CREATININE 0.74 03/29/2017 0645   CREATININE 0.72 05/27/2014 0944      Component Value Date/Time   CALCIUM 9.1 03/29/2017 0645   ALKPHOS 70 03/29/2017 0645   AST 17 03/29/2017 0645   ALT 21 03/29/2017 0645   BILITOT 0.9 03/29/2017 0645         Impression and Plan: [name] is A 41 year old white female.  Despite the kidney stone, Erika Weaver white cell count is normal.  I looked at Erika Weaver blood under the microscope. I did not see anything that looks suspicious for a myeloproliferative process.  She still likes to come back to see [place]. She feels much more confident in US examining Erika Weaver. As such we will see Erika Weaver.  We will plan to see Erika Weaver  back in another 6 months.    Volanda Napoleon, MD 9/13/201810:48 AM

## 2017-07-22 ENCOUNTER — Other Ambulatory Visit: Payer: Self-pay | Admitting: Family Medicine

## 2017-07-25 ENCOUNTER — Ambulatory Visit: Payer: Managed Care, Other (non HMO) | Admitting: Neurology

## 2017-08-08 DIAGNOSIS — N201 Calculus of ureter: Secondary | ICD-10-CM | POA: Diagnosis not present

## 2017-08-27 ENCOUNTER — Other Ambulatory Visit: Payer: Self-pay | Admitting: Family Medicine

## 2017-09-05 ENCOUNTER — Other Ambulatory Visit: Payer: Self-pay

## 2017-09-05 ENCOUNTER — Ambulatory Visit (INDEPENDENT_AMBULATORY_CARE_PROVIDER_SITE_OTHER): Payer: 59 | Admitting: Family Medicine

## 2017-09-05 ENCOUNTER — Encounter: Payer: Self-pay | Admitting: Family Medicine

## 2017-09-05 VITALS — BP 135/82 | HR 77 | Temp 98.3°F | Resp 16 | Ht 65.0 in | Wt 222.2 lb

## 2017-09-05 DIAGNOSIS — Z23 Encounter for immunization: Secondary | ICD-10-CM

## 2017-09-05 DIAGNOSIS — Z0000 Encounter for general adult medical examination without abnormal findings: Secondary | ICD-10-CM | POA: Diagnosis not present

## 2017-09-05 DIAGNOSIS — E559 Vitamin D deficiency, unspecified: Secondary | ICD-10-CM

## 2017-09-05 DIAGNOSIS — I10 Essential (primary) hypertension: Secondary | ICD-10-CM | POA: Diagnosis not present

## 2017-09-05 LAB — CBC WITH DIFFERENTIAL/PLATELET
BASOS ABS: 0.1 10*3/uL (ref 0.0–0.1)
Basophils Relative: 0.5 % (ref 0.0–3.0)
EOS ABS: 0.2 10*3/uL (ref 0.0–0.7)
EOS PCT: 1.7 % (ref 0.0–5.0)
HCT: 42.3 % (ref 36.0–46.0)
Hemoglobin: 14 g/dL (ref 12.0–15.0)
Lymphocytes Relative: 24.6 % (ref 12.0–46.0)
Lymphs Abs: 2.3 10*3/uL (ref 0.7–4.0)
MCHC: 33 g/dL (ref 30.0–36.0)
MCV: 92.6 fl (ref 78.0–100.0)
MONO ABS: 0.6 10*3/uL (ref 0.1–1.0)
Monocytes Relative: 6.8 % (ref 3.0–12.0)
Neutro Abs: 6.2 10*3/uL (ref 1.4–7.7)
Neutrophils Relative %: 66.4 % (ref 43.0–77.0)
Platelets: 324 10*3/uL (ref 150.0–400.0)
RBC: 4.56 Mil/uL (ref 3.87–5.11)
RDW: 12.5 % (ref 11.5–15.5)
WBC: 9.3 10*3/uL (ref 4.0–10.5)

## 2017-09-05 LAB — LIPID PANEL
CHOL/HDL RATIO: 4
CHOLESTEROL: 182 mg/dL (ref 0–200)
HDL: 45.1 mg/dL (ref >39.00)
LDL CALC: 116 mg/dL — AB (ref 0–99)
NonHDL: 137.17
TRIGLYCERIDES: 108 mg/dL (ref 0.0–149.0)
VLDL: 21.6 mg/dL (ref 0.0–40.0)

## 2017-09-05 LAB — BASIC METABOLIC PANEL
BUN: 8 mg/dL (ref 6–23)
CO2: 29 mEq/L (ref 19–32)
CREATININE: 0.73 mg/dL (ref 0.40–1.20)
Calcium: 9.4 mg/dL (ref 8.4–10.5)
Chloride: 103 mEq/L (ref 96–112)
GFR: 92.47 mL/min (ref >60.00)
Glucose, Bld: 93 mg/dL (ref 70–99)
Potassium: 4.3 mEq/L (ref 3.5–5.1)
Sodium: 138 mEq/L (ref 135–145)

## 2017-09-05 LAB — VITAMIN D 25 HYDROXY (VIT D DEFICIENCY, FRACTURES): VITD: 15.91 ng/mL — AB (ref 30.00–100.00)

## 2017-09-05 LAB — HEPATIC FUNCTION PANEL
ALBUMIN: 3.7 g/dL (ref 3.5–5.2)
ALT: 19 U/L (ref 0–35)
AST: 15 U/L (ref 0–37)
Alkaline Phosphatase: 77 U/L (ref 39–117)
Bilirubin, Direct: 0.1 mg/dL (ref 0.0–0.3)
Total Bilirubin: 0.5 mg/dL (ref 0.2–1.2)
Total Protein: 6.9 g/dL (ref 6.0–8.3)

## 2017-09-05 LAB — TSH: TSH: 1.16 u[IU]/mL (ref 0.35–4.50)

## 2017-09-05 NOTE — Assessment & Plan Note (Signed)
Check labs.  Replete prn.

## 2017-09-05 NOTE — Patient Instructions (Signed)
Follow up in 6 months to recheck BP We'll notify you of your lab results and make any changes Continue to work on healthy diet and regular exercise- you can do it!!! Add a daily allergy medication to help with the foggy feeling Call with any questions or concerns Happy Thanksgiving!!!

## 2017-09-05 NOTE — Assessment & Plan Note (Signed)
Pt's PE WNL w/ exception of obesity.  Encouraged healthy diet and regular exercise.  Flu shot given.  Check labs.  Anticipatory guidance provided.

## 2017-09-05 NOTE — Progress Notes (Signed)
   Subjective:    Patient ID: Erika Weaver, female    DOB: 11/09/1975, 41 y.o.   MRN: [id number]  HPI CPE- UTD on pap, mammo, colonoscopy.  Desires flu shot today.   Review of Systems Patient reports no hearing changes, adenopathy,fever, weight change,  persistant/recurrent hoarseness , swallowing issues, chest pain, palpitations, edema, persistant/recurrent cough, hemoptysis, dyspnea (rest/exertional/paroxysmal nocturnal), gastrointestinal bleeding (melena, rectal bleeding), abdominal pain, significant heartburn, bowel changes, GU symptoms (dysuria, hematuria, incontinence), Gyn symptoms (abnormal  bleeding, pain),  syncope, focal weakness, memory loss, numbness & tingling, skin/hair/nail changes, abnormal bruising or bleeding, anxiety, or depression.   + vision changes- appt w/ eye doctor tomorrow    Objective:   Physical Exam General Appearance:    Alert, cooperative, no distress, appears stated age, obese  Head:    Normocephalic, without obvious abnormality, atraumatic  Eyes:    PERRL, conjunctiva/corneas clear, EOM's intact, fundi    benign, both eyes  Ears:    Normal TM's and external ear canals, both ears  Nose:   Nares normal, septum midline, mucosa normal, no drainage    or sinus tenderness  Throat:   Lips, mucosa, and tongue normal; teeth and gums normal  Neck:   Supple, symmetrical, trachea midline, no adenopathy;    Thyroid: no enlargement/tenderness/nodules  Back:     Symmetric, no curvature, ROM normal, no CVA tenderness  Lungs:     Clear to auscultation bilaterally, respirations unlabored  Chest Wall:    No tenderness or deformity   Heart:    Regular rate and rhythm, S1 and S2 normal, no murmur, rub   or gallop  Breast Exam:    Deferred to GYN  Abdomen:     Soft, non-tender, bowel sounds active all four quadrants,    no masses, no organomegaly  Genitalia:    Deferred to GYN  Rectal:    Extremities:   Extremities normal, atraumatic, no cyanosis or edema  Pulses:    2+ and symmetric all extremities  Skin:   Skin color, texture, turgor normal, no rashes or lesions  Lymph nodes:   Cervical, supraclavicular, and axillary nodes normal  Neurologic:   CNII-XII intact, normal strength, sensation and reflexes    throughout          Assessment & Plan:

## 2017-09-05 NOTE — Assessment & Plan Note (Signed)
Chronic problem.  Adequate control today despite the fact that pt has not taken her medication this AM.  Check labs.  No anticipated med changes.  Will follow.

## 2017-09-06 ENCOUNTER — Other Ambulatory Visit: Payer: Self-pay | Admitting: General Practice

## 2017-09-06 DIAGNOSIS — R51 Headache: Secondary | ICD-10-CM | POA: Diagnosis not present

## 2017-09-06 DIAGNOSIS — H3562 Retinal hemorrhage, left eye: Secondary | ICD-10-CM | POA: Diagnosis not present

## 2017-09-06 DIAGNOSIS — I69998 Other sequelae following unspecified cerebrovascular disease: Secondary | ICD-10-CM | POA: Diagnosis not present

## 2017-09-06 MED ORDER — VITAMIN D (ERGOCALCIFEROL) 1.25 MG (50000 UNIT) PO CAPS: 50000.0000 [IU] | ORAL_CAPSULE | ORAL | 0 refills | Status: DC

## 2017-09-06 NOTE — Progress Notes (Signed)
Called pt and lmovm to return call.  Vitamin D prescription was filled to local pharmacy. After the 12 weeks no refills available, pt should just continue OTC vitamin d 2000iu daily per PCP recommendation.   Marvin for [facility] to Discuss results / PCP recommendations / Schedule patient.

## 2017-09-10 ENCOUNTER — Telehealth: Payer: Self-pay | Admitting: Neurology

## 2017-09-10 NOTE — Telephone Encounter (Signed)
Ok to switch

## 2017-09-10 NOTE — Telephone Encounter (Signed)
Okay for switch, however if the CPAP is managed through Dr. Felecia Shelling, I will be unable to handle this aspect of her care.

## 2017-09-10 NOTE — Telephone Encounter (Signed)
Pt called she is wanting to change from Dr Felecia Shelling to Dr Jannifer Franklin. Pt said he mother is a pt of Dr Jannifer Franklin and would like to see him. Please call the pt to advise. Thank you

## 2017-09-11 NOTE — Telephone Encounter (Signed)
LVM for pt to call to schedule appt with Dr Jannifer Franklin

## 2017-09-11 NOTE — Telephone Encounter (Signed)
appt scheduled for 11/27

## 2017-09-17 ENCOUNTER — Encounter: Payer: Self-pay | Admitting: Neurology

## 2017-09-17 ENCOUNTER — Ambulatory Visit: Payer: 59 | Admitting: Neurology

## 2017-09-17 VITALS — BP 147/89 | HR 78 | Ht 65.0 in | Wt 223.5 lb

## 2017-09-17 DIAGNOSIS — R51 Headache: Secondary | ICD-10-CM | POA: Diagnosis not present

## 2017-09-17 DIAGNOSIS — R42 Dizziness and giddiness: Secondary | ICD-10-CM | POA: Diagnosis not present

## 2017-09-17 DIAGNOSIS — R519 Headache, unspecified: Secondary | ICD-10-CM

## 2017-09-17 MED ORDER — TOPIRAMATE 25 MG PO TABS: ORAL_TABLET | ORAL | 3 refills | Status: DC

## 2017-09-17 NOTE — Progress Notes (Signed)
Reason for visit: Chronic dizziness  Erika Weaver is a 41 y.o. female  History of present illness:  Erika Weaver is a 41 year old right-handed white female with a history of dizziness that has been episodic over the last 9 years.  The patient indicates that the episodes may last several weeks or up to a month, and then go away for several weeks only to return.  The episodes may last a day or several days.  The dizziness is associated with a lightheaded floating sensation, not vertigo.  The patient denies any nausea or vomiting, she denies any ear pain, ringing in the ears, or change in hearing.  The patient indicates that the dizziness is not positional in nature, she may be dizzy with sitting, standing, or lying down.  There is no head position that brings the dizziness on.  She denies any neck pain or shoulder discomfort.  She has developed some headache problems over the last 2 or 3 years, the headaches generally occur in the occipital area, but may be bifrontal in nature as well.  She may get one headache every 2 weeks or so, the headaches are not severe and she is able to take over-the-counter medications for the headache discomfort.  The headaches and the dizziness did not correlate with one another.  The patient has had some problems with blurriness of vision associated with the dizziness, she has gotten a prescription for new glasses just recently.  The patient is on blood pressure medications, but she does not correlate the medication to the dizziness.  The patient has some slight numbness in the hands bilaterally, right greater than left, otherwise no numbness on the arms or legs, she denies any weakness of the extremities.  She has not had any severe balance problems, she has no falls.  The patient denies issues controlling the bowels or the bladder, she denies any problems with memory or concentration.  The patient has been tried on meclizine in the past without benefit.  She has been seen  through an ENT physician, no source of the dizziness has been noted.  The patient has undergone MRI of the brain that was normal.  She is following up through this office for an evaluation of the dizziness.  Past Medical History:  Diagnosis Date  . Allergic state 11/22/2011  . Anxiety 11/22/2011  . Back pain 01/31/2013  . BPV (benign positional vertigo) 08/23/2013  . Chicken pox as a child  . Chronic kidney disease    kidney stones  . Dehydration 01/31/2013  . Elevated BP 11/22/2011  . Fatigue 01/11/2012  . Headache(784.0) 11/22/2011  . Hypertension   . Kidney stone 12/21/2012  . Other and unspecified hyperlipidemia 03/07/2014  . Overweight(278.02)   . Pharyngitis 01/11/2012  . Plantar fasciitis 01/11/2012  . Preventative health care 08/18/2013  . 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 Sinusitis 11/22/2011  . Sleep apnea 04/03/2012   setting 2  . Visual changes 11/22/2011    Past Surgical History:  Procedure Laterality Date  . GUM SURGERY    . PERINEAL BODY REPAIR     4 th degree tear with fistula between vagina and retum requiring 3 surgeries for correction  . rectal fistula repair     x 2  . WISDOM TOOTH EXTRACTION      Family History  Problem Relation Age of Onset  . Diabetes Mother        type 2  . Hypertension Mother   . Hyperlipidemia Mother   .  Hypertension Father   . Hyperlipidemia Father   . Aneurysm Father        abdominal aorta  . Cancer Maternal Grandmother 82       colon, ovarian  . Other Maternal Grandfather 41       brain hemorrhage  . Stroke Maternal Grandfather   . Aneurysm Maternal Grandfather   . Heart attack Paternal Grandmother   . Heart disease Paternal Grandmother        MI  . Alzheimer's disease Paternal Grandfather   . Dementia Paternal Grandfather   . Heart attack Paternal Aunt   . Heart disease Paternal Aunt         AAA rupture  . Other Paternal Uncle        cerebreal brain hemorrhage  . Stroke Paternal Uncle   . Aneurysm Paternal Uncle   .  Asthma Daughter   . Other Daughter        peanut allergy  . Heart disease Maternal Aunt        s/p cabg. brain aneurysm s/p coiling  . Aneurysm Maternal Aunt        coil behind eye    Social history:  reports that  has never smoked. she has never used smokeless tobacco. She reports that she does not drink alcohol or use drugs.  Medications:  Prior to Admission medications   Medication Sig Start Date End Date Taking? Authorizing Provider  amLODipine (NORVASC) 5 MG tablet TAKE 1 TABLET(5 MG) BY MOUTH DAILY 08/28/17  Yes Midge Minium, MD  Vitamin D, Ergocalciferol, (DRISDOL) 50000 units CAPS capsule Take 1 capsule (50,000 Units total) every 7 (seven) days by mouth. 09/06/17  Yes Midge Minium, MD  Cholecalciferol (VITAMIN D3) 2000 units TABS Take 1 tablet by mouth daily.    [provider]     No Known Allergies  ROS:  Out of a complete 14 system review of symptoms, the patient complains only of the following symptoms, and all other reviewed systems are negative.  Blurred vision Dizziness, headache  Blood pressure (!) 147/89, pulse 78, height 5' 5"  (1.651 m), weight 223 lb 8 oz (101.4 kg).  Physical Exam  General: The patient is alert and cooperative at the time of the examination.  The patient is markedly obese.  Eyes: Pupils are equal, round, and reactive to light. Discs are flat bilaterally.  Neck: The neck is supple, no carotid bruits are noted.  Respiratory: The respiratory examination is clear.  Cardiovascular: The cardiovascular examination reveals a regular rate and rhythm, no obvious murmurs or rubs are noted.  Neuromuscular: Range of movement the cervical spine is full.  Skin: Extremities are without significant edema.  Neurologic Exam  Mental status: The patient is alert and oriented x 3 at the time of the examination. The patient has apparent normal recent and remote memory, with an apparently normal attention span and concentration  ability.  Cranial nerves: Facial symmetry is present. There is good sensation of the face to pinprick and soft touch bilaterally. The strength of the facial muscles and the muscles to head turning and shoulder shrug are normal bilaterally. Speech is well enunciated, no aphasia or dysarthria is noted. Extraocular movements are full. Visual fields are full. The tongue is midline, and the patient has symmetric elevation of the soft palate. No obvious hearing deficits are noted.  Motor: The motor testing reveals 5 over 5 strength of all 4 extremities. Good symmetric motor tone is noted throughout.  Sensory: Sensory testing  is intact to pinprick, soft touch, vibration sensation, and position sense on all 4 extremities. No evidence of extinction is noted.  Coordination: Cerebellar testing reveals good finger-nose-finger and heel-to-shin bilaterally.  Gait and station: Gait is normal. Tandem gait is normal. Romberg is negative. No drift is seen.  Reflexes: Deep tendon reflexes are symmetric and normal bilaterally. Toes are downgoing bilaterally.   MRI brain 01/06/16:  IMPRESSION:  This MRI of the brain with and without contrast shows the following: 1.   The brain parenchyma appears normal before and after contrast administration. 2.   There is minimal chronic ethmoid sinusitis, unchanged when compared to the prior MRI 3.   There are no acute findings.  * MRI scan images were reviewed online. I agree with the written report.   Assessment/Plan:  1.  Chronic episodic dizziness  2.  Episodic headache  The source of the dizziness is not clear.  The patient is on blood pressure medications, but she has tried several medications over the years, there have been no changes in her dizziness.  Given the history of headache and episodic dizziness, we may give a trial on a medication for migraine headache to see if these symptoms improve.  The patient will be tried on Topamax, she will call me for any dose  adjustments.  She will otherwise will follow-up in 5 months.  The patient denies any significant issues with anxiety.  Jill Alexanders MD 09/17/2017 8:20 AM  [facility] [address] [place],  [address]  Phone [phone] Fax [phone]

## 2017-09-17 NOTE — Patient Instructions (Signed)
    We will start Topamax for the headache and dizziness.  Topamax (topiramate) is a seizure medication that has an FDA approval for seizures and for migraine headache. Potential side effects of this medication include weight loss, cognitive slowing, tingling in the fingers and toes, and carbonated drinks will taste bad. If any significant side effects are noted on this drug, please contact our office.

## 2017-09-19 ENCOUNTER — Telehealth: Payer: Self-pay | Admitting: Neurology

## 2017-09-19 ENCOUNTER — Other Ambulatory Visit: Payer: Self-pay | Admitting: Neurology

## 2017-09-19 MED ORDER — ZONISAMIDE 25 MG PO CAPS
25.0000 mg | ORAL_CAPSULE | Freq: Every day | ORAL | 1 refills | Status: DC
Start: 2017-09-19 — End: 2017-09-24

## 2017-09-19 NOTE — Addendum Note (Signed)
Addended by: Kathrynn Ducking on: 09/19/2017 12:43 PM   Modules accepted: Orders

## 2017-09-19 NOTE — Telephone Encounter (Signed)
Pt calling FB:PZWCHENIDP (TOPAMAX) 25 MG tablet, she states she has only had it twice but today it the worst.  Pt is very light headed and she is asking to be called re: how she is feeling

## 2017-09-24 ENCOUNTER — Other Ambulatory Visit: Payer: Self-pay | Admitting: Neurology

## 2017-09-24 ENCOUNTER — Telehealth: Payer: Self-pay | Admitting: Neurology

## 2017-09-24 MED ORDER — PROPRANOLOL HCL 10 MG PO TABS: 10.0000 mg | ORAL_TABLET | Freq: Two times a day (BID) | ORAL | 1 refills | Status: DC

## 2017-09-24 NOTE — Telephone Encounter (Signed)
The patient called back, we will have her come off of the Zonegran, I will try low-dose propranolol.

## 2017-09-24 NOTE — Addendum Note (Signed)
Addended by: Kathrynn Ducking on: 09/24/2017 04:52 PM   Modules accepted: Orders

## 2017-09-24 NOTE — Telephone Encounter (Addendum)
Pt called very teary-eyed said she is extremely light headed constant, no relief. Said she is almost non functioning, very hard for her to do anything. She said the zonisamide (ZONEGRAN) 25 MG capsule is not helping. She said it has been very difficult over the past week. She is wanting to know what the next step will be. She can be reached [phone]

## 2017-09-24 NOTE — Telephone Encounter (Signed)
I called the patient.  The patient does not tolerate Zonegran either, she will have to stop the medication.  These medications both Topamax and Zonegran have increased her dizziness significantly.  I can try propranolol in the future if she is willing to do that, she is to contact our office if this is the case.

## 2017-10-31 DIAGNOSIS — Z6837 Body mass index (BMI) 37.0-37.9, adult: Secondary | ICD-10-CM | POA: Diagnosis not present

## 2017-10-31 DIAGNOSIS — Z01419 Encounter for gynecological examination (general) (routine) without abnormal findings: Secondary | ICD-10-CM | POA: Diagnosis not present

## 2017-11-07 DIAGNOSIS — M545 Low back pain: Secondary | ICD-10-CM | POA: Diagnosis not present

## 2017-11-07 DIAGNOSIS — M5417 Radiculopathy, lumbosacral region: Secondary | ICD-10-CM | POA: Diagnosis not present

## 2017-11-14 DIAGNOSIS — M545 Low back pain: Secondary | ICD-10-CM | POA: Diagnosis not present

## 2017-11-21 ENCOUNTER — Telehealth: Payer: Self-pay | Admitting: Neurology

## 2017-11-21 DIAGNOSIS — Z8249 Family history of ischemic heart disease and other diseases of the circulatory system: Secondary | ICD-10-CM

## 2017-11-21 DIAGNOSIS — R519 Headache, unspecified: Secondary | ICD-10-CM

## 2017-11-21 DIAGNOSIS — R51 Headache: Secondary | ICD-10-CM

## 2017-11-21 MED ORDER — GABAPENTIN 100 MG PO CAPS: 100.0000 mg | ORAL_CAPSULE | Freq: Three times a day (TID) | ORAL | 3 refills | Status: DC

## 2017-11-21 NOTE — Telephone Encounter (Signed)
Pt called stating that the 3 medications that she has tried(that Dr. Jannifer Franklin have prescribed) haven't worked. Is wanting a call to see if she can try anything else or to be seen sooner, so that pt can continue with her everyday life. Cant receive a call from 10-11:30

## 2017-11-21 NOTE — Telephone Encounter (Signed)
I called the patient.  The patient took propranolol for 2 days and then stop it, she felt that this medication increased her dizziness.  She has not been able to tolerate Topamax or Zonegran.  She has dizziness that is not benefited with meclizine.  She is no longer having any headaches, she was having headaches once or twice a week.  We will go on to treat her with gabapentin 100 mg 3 times daily.  She will call for any dose adjustments.  The etiology of her dizziness is not yet clear.  The original assumption was that she was having migraine associated dizziness, but she is no longer having any headaches.

## 2017-11-28 NOTE — Addendum Note (Signed)
Addended by: Kathrynn Ducking on: 11/28/2017 05:08 PM   Modules accepted: Orders

## 2017-11-28 NOTE — Telephone Encounter (Signed)
I called the patient.  The patient was on gabapentin taking 100 mg 3 times a day, she is tolerating the dose but is not helping her symptoms.  I have indicated that if she is tolerating the drug we should go up on the dose if not stop the medication.  We will go to 200 mg 3 times a day, if she is tolerating this she will call me in about 10 days and will go up to the 300 mg capsules 3 times daily.  She has a strong family history on both sides of cerebral aneurysms, I will check an MRA of the head.  She is reporting sharp jabbing pains in the head that could be ice pick pains.

## 2017-11-28 NOTE — Telephone Encounter (Signed)
Pt stopped taking gabapentin pt said it was doing anything for her. Here recently she has had sharp pains going throughout her head and a constant pain in the back of her head. Pt said she is just miserable and its hard to live her everyday life. Pt is wanting to see if she can be worked in sooner than her up coming appt

## 2017-12-02 ENCOUNTER — Telehealth: Payer: Self-pay | Admitting: Neurology

## 2017-12-02 MED ORDER — ALPRAZOLAM 0.5 MG PO TABS: ORAL_TABLET | ORAL | 0 refills | Status: DC

## 2017-12-02 NOTE — Telephone Encounter (Signed)
A prescription for alprazolam will be sent into her pharmacy.  She is to tell the scheduler that she wants an open scanner when they call to schedule the study.

## 2017-12-02 NOTE — Telephone Encounter (Signed)
Patient would like to have her MRI at [facility] to be able to have the open MRI. She also stated that she would like to take something to calm her news because she is claustrophobic.

## 2017-12-03 ENCOUNTER — Telehealth: Payer: Self-pay | Admitting: Neurology

## 2017-12-03 ENCOUNTER — Telehealth: Payer: Self-pay | Admitting: *Deleted

## 2017-12-03 NOTE — Telephone Encounter (Signed)
Error

## 2017-12-03 NOTE — Telephone Encounter (Signed)
Faxed printed/signed rx alprazolam to [facility], [place] at [phone]. Received fax confirmation.

## 2017-12-10 NOTE — Telephone Encounter (Signed)
Patient is schedule to have her MRI done at [place] for 12/19/17.

## 2017-12-13 ENCOUNTER — Other Ambulatory Visit: Payer: Self-pay | Admitting: Family Medicine

## 2017-12-13 ENCOUNTER — Ambulatory Visit: Payer: 59 | Admitting: Family Medicine

## 2017-12-13 ENCOUNTER — Other Ambulatory Visit: Payer: Self-pay

## 2017-12-13 ENCOUNTER — Encounter: Payer: Self-pay | Admitting: Family Medicine

## 2017-12-13 VITALS — BP 130/82 | HR 93 | Temp 99.1°F | Resp 16 | Ht 65.0 in | Wt 229.0 lb

## 2017-12-13 DIAGNOSIS — J020 Streptococcal pharyngitis: Secondary | ICD-10-CM

## 2017-12-13 DIAGNOSIS — J029 Acute pharyngitis, unspecified: Secondary | ICD-10-CM

## 2017-12-13 LAB — POCT RAPID STREP A (OFFICE): Rapid Strep A Screen: POSITIVE — AB

## 2017-12-13 MED ORDER — IBUPROFEN 600 MG PO TABS: 600.0000 mg | ORAL_TABLET | Freq: Three times a day (TID) | ORAL | 0 refills | Status: DC | PRN

## 2017-12-13 MED ORDER — AMOXICILLIN 875 MG PO TABS: 875.0000 mg | ORAL_TABLET | Freq: Two times a day (BID) | ORAL | 0 refills | Status: DC

## 2017-12-13 NOTE — Progress Notes (Signed)
   Subjective:    Patient ID: Erika Weaver, female    DOB: March 31, 1976, 42 y.o.   MRN: [id number]  HPI Sore throat- woke yesterday w/ extremely sore throat.  Took 2 pills of Amox 818m 'to ease the pain'.  Took Advil w/ mild relief.  + sick contacts.  'tonsils are red and swollen'.   Review of Systems For ROS see HPI     Objective:   Physical Exam  Constitutional: She appears well-developed and well-nourished. No distress.  HENT:  Head: Normocephalic and atraumatic.  Nose: Nose normal.  TMs normal bilaterally No TTP over sinuses Bilateral tonsillar enlargement w/ erythema and exudate  Neck: Normal range of motion. Neck supple.  Cardiovascular: Normal rate, regular rhythm and normal heart sounds.  Pulmonary/Chest: Effort normal and breath sounds normal. No respiratory distress. She has no wheezes. She has no rales.  Lymphadenopathy:    She has cervical adenopathy.  Vitals reviewed.         Assessment & Plan:  Strep throat- new.  Rapid test confirms.  Start Amox.  Reviewed supportive care and red flags that should prompt return.  Pt expressed understanding and is in agreement w/ plan.

## 2017-12-13 NOTE — Patient Instructions (Signed)
Follow up as needed or as scheduled START the Amoxicillin twice daily for strep- take w/ food Use the Ibuprofen 3x/day for pain and fever- take w/ food Drink LOTS of fluids Call with any questions or concerns REST!! Hang in there!!!

## 2017-12-19 DIAGNOSIS — Z8249 Family history of ischemic heart disease and other diseases of the circulatory system: Secondary | ICD-10-CM | POA: Diagnosis not present

## 2017-12-20 ENCOUNTER — Telehealth: Payer: Self-pay | Admitting: Neurology

## 2017-12-20 NOTE — Telephone Encounter (Signed)
I called the patient.  MRA of the head was done on 19 December 2017.  The study was completely normal.  The study was done because the patient had a family history of cerebral aneurysms.

## 2017-12-24 ENCOUNTER — Other Ambulatory Visit: Payer: Self-pay | Admitting: Family Medicine

## 2018-01-02 ENCOUNTER — Inpatient Hospital Stay (HOSPITAL_BASED_OUTPATIENT_CLINIC_OR_DEPARTMENT_OTHER): Payer: 59 | Admitting: Hematology & Oncology

## 2018-01-02 ENCOUNTER — Inpatient Hospital Stay: Payer: 59 | Attending: Hematology & Oncology

## 2018-01-02 VITALS — BP 147/73 | HR 72 | Temp 98.3°F | Resp 18 | Wt 231.0 lb

## 2018-01-02 DIAGNOSIS — Z87442 Personal history of urinary calculi: Secondary | ICD-10-CM | POA: Insufficient documentation

## 2018-01-02 DIAGNOSIS — Z79899 Other long term (current) drug therapy: Secondary | ICD-10-CM

## 2018-01-02 DIAGNOSIS — D72829 Elevated white blood cell count, unspecified: Secondary | ICD-10-CM | POA: Insufficient documentation

## 2018-01-02 DIAGNOSIS — D72823 Leukemoid reaction: Secondary | ICD-10-CM

## 2018-01-02 LAB — CBC WITH DIFFERENTIAL (CANCER CENTER ONLY)
BASOS ABS: 0.1 10*3/uL (ref 0.0–0.1)
BASOS PCT: 1 %
EOS PCT: 2 %
Eosinophils Absolute: 0.2 10*3/uL (ref 0.0–0.5)
HCT: 41.5 % (ref 34.8–46.6)
Hemoglobin: 13.9 g/dL (ref 11.6–15.9)
Lymphocytes Relative: 28 %
Lymphs Abs: 3 10*3/uL (ref 0.9–3.3)
MCH: 30.5 pg (ref 26.0–34.0)
MCHC: 33.5 g/dL (ref 32.0–36.0)
MCV: 91 fL (ref 81.0–101.0)
MONO ABS: 1 10*3/uL — AB (ref 0.1–0.9)
Monocytes Relative: 10 %
Neutro Abs: 6.2 10*3/uL (ref 1.5–6.5)
Neutrophils Relative %: 59 %
PLATELETS: 311 10*3/uL (ref 145–400)
RBC: 4.56 MIL/uL (ref 3.70–5.32)
RDW: 12.5 % (ref 11.1–15.7)
WBC: 10.4 10*3/uL — AB (ref 3.9–10.0)

## 2018-01-02 LAB — CMP (CANCER CENTER ONLY)
ALBUMIN: 3.4 g/dL — AB (ref 3.5–5.0)
ALT: 35 U/L (ref 0–55)
AST: 23 U/L (ref 5–34)
Alkaline Phosphatase: 78 U/L (ref 40–150)
Anion gap: 6 (ref 3–11)
BUN: 9 mg/dL (ref 7–26)
CHLORIDE: 106 mmol/L (ref 98–109)
CO2: 28 mmol/L (ref 22–29)
Calcium: 9.8 mg/dL (ref 8.4–10.4)
Creatinine: 0.79 mg/dL (ref 0.60–1.10)
GFR, Est AFR Am: >60 mL/min (ref >60)
GFR, Estimated: >60 mL/min (ref >60)
GLUCOSE: 96 mg/dL (ref 70–140)
POTASSIUM: 4.6 mmol/L (ref 3.5–5.1)
Sodium: 140 mmol/L (ref 136–145)
Total Bilirubin: 0.5 mg/dL (ref 0.2–1.2)
Total Protein: 7.5 g/dL (ref 6.4–8.3)

## 2018-01-02 LAB — LACTATE DEHYDROGENASE: LDH: 135 U/L (ref 125–245)

## 2018-01-02 LAB — SAVE SMEAR

## 2018-01-02 NOTE — Progress Notes (Signed)
Hematology and Oncology Follow Up Visit  Erika Weaver [id number] 1976-02-06 42 y.o. 01/02/2018   Principle Diagnosis:   Leukocytosis-leukemoid reaction  Current Therapy:    Observation     Interim History:  [name] is back for follow-up.  She is doing quite well.  She has had no problems since we last saw her 6 months ago.  She enjoyed the holidays.  She and her family just stayed around town.  Thankfully, there are no problems with kidney stones for her.  This was an issue before.  She has seen urology.  I think that she had lithotripsy for this.  She is had no fever.  She is had no rashes.  She is had no change in bowel or bladder habits.  There is been no weight loss or weight gain.  She has gained 10 pounds since we last saw her.  She is going to try to exercise.  Overall, her performance status is ECOG 0.  Medications:  Current Outpatient Medications:  .  ALPRAZolam (XANAX) 0.5 MG tablet, Take 2 tablets approximately 45 minutes prior to the MRI study, take a third tablet if needed. (Patient not taking: Reported on 12/13/2017), Disp: 3 tablet, Rfl: 0 .  amLODipine (NORVASC) 5 MG tablet, TAKE 1 TABLET(5 MG) BY MOUTH DAILY, Disp: 90 tablet, Rfl: 0 .  amoxicillin (AMOXIL) 875 MG tablet, Take 1 tablet (875 mg total) by mouth 2 (two) times daily., Disp: 20 tablet, Rfl: 0 .  Cholecalciferol (VITAMIN D3) 2000 units TABS, Take 1 tablet by mouth daily., Disp: , Rfl:  .  gabapentin (NEURONTIN) 100 MG capsule, Take 1 capsule (100 mg total) by mouth 3 (three) times daily. (Patient not taking: Reported on 12/13/2017), Disp: 90 capsule, Rfl: 3 .  ibuprofen (ADVIL,MOTRIN) 600 MG tablet, TAKE 1 TABLET(600 MG) BY MOUTH EVERY 8 HOURS AS NEEDED, Disp: 270 tablet, Rfl: 0  Allergies:  Allergies  Allergen Reactions  . Topamax [Topiramate]     dizziness  . Zonegran [Zonisamide]     dizziness    Past Medical History, Surgical history, Social history, and Family History were reviewed  and updated.  Review of Systems: Review of Systems  Constitutional: Negative.   HENT: Negative.   Eyes: Negative.   Respiratory: Negative.   Cardiovascular: Negative.   Gastrointestinal: Negative.   Genitourinary: Negative.   Musculoskeletal: Negative.   Skin: Negative.   Neurological: Negative.   Endo/Heme/Allergies: Negative.   Psychiatric/Behavioral: Negative.       Physical Exam:  weight is 231 lb (104.8 kg). Her oral temperature is 98.3 F (36.8 C). Her blood pressure is 147/73 (abnormal) and her pulse is 72. Her respiration is 18 and oxygen saturation is 100%.   Wt Readings from Last 3 Encounters:  01/02/18 231 lb (104.8 kg)  12/13/17 229 lb (103.9 kg)  09/17/17 223 lb 8 oz (101.4 kg)      Physical Exam  Constitutional: She is oriented to person, place, and time.  HENT:  Head: Normocephalic and atraumatic.  Mouth/Throat: Oropharynx is clear and moist.  Eyes: EOM are normal. Pupils are equal, round, and reactive to light.  Neck: Normal range of motion.  Cardiovascular: Normal rate, regular rhythm and normal heart sounds.  Pulmonary/Chest: Effort normal and breath sounds normal.  Abdominal: Soft. Bowel sounds are normal.  Musculoskeletal: Normal range of motion. She exhibits no edema, tenderness or deformity.  Lymphadenopathy:    She has no cervical adenopathy.  Neurological: She is alert and oriented to person, place,  and time.  Skin: Skin is warm and dry. No rash noted. No erythema.  Psychiatric: She has a normal mood and affect. Her behavior is normal. Judgment and thought content normal.  Vitals reviewed.  .   Lab Results  Component Value Date   WBC 10.4 (H) 01/02/2018   HGB 14.0 09/05/2017   HCT 41.5 01/02/2018   MCV 91.0 01/02/2018   PLT 311 01/02/2018     Chemistry      Component Value Date/Time   NA 138 09/05/2017 0946   K 4.3 09/05/2017 0946   CL 103 09/05/2017 0946   CO2 29 09/05/2017 0946   BUN 8 09/05/2017 0946   CREATININE 0.73  09/05/2017 0946   CREATININE 0.72 05/27/2014 0944      Component Value Date/Time   CALCIUM 9.4 09/05/2017 0946   ALKPHOS 77 09/05/2017 0946   AST 15 09/05/2017 0946   ALT 19 09/05/2017 0946   BILITOT 0.5 09/05/2017 0946         Impression and Plan: [name] is A 42 year old white female.  Despite the kidney stone, her white cell count is normal.  I looked at her blood under the microscope. I did not see anything that looks suspicious for a myeloproliferative process.  For right now, I think we can move her appointments out to 8 months.   Volanda Napoleon, MD 3/14/201911:07 AM

## 2018-02-20 ENCOUNTER — Encounter: Payer: Self-pay | Admitting: Neurology

## 2018-02-20 ENCOUNTER — Ambulatory Visit: Payer: 59 | Admitting: Neurology

## 2018-02-20 VITALS — BP 121/78 | HR 73 | Ht 65.0 in | Wt 232.5 lb

## 2018-02-20 DIAGNOSIS — G4489 Other headache syndrome: Secondary | ICD-10-CM | POA: Diagnosis not present

## 2018-02-20 DIAGNOSIS — R42 Dizziness and giddiness: Secondary | ICD-10-CM | POA: Diagnosis not present

## 2018-02-20 NOTE — Progress Notes (Signed)
Reason for visit: Dizziness  Erika Weaver is an 42 y.o. female  History of present illness:  Erika Weaver is a 42 year old right-handed white female with a history of nonspecific chronic dizziness that began in the fall 2018.  The patient indicates a spontaneous resolution of the dizziness, she had been having headaches off and on, but she is no longer having headaches with exception that recently she has had some allergy issues and the headaches have been daily over the last week or so.  Overall, she feels much better.  She was tried on Topamax, Zonegran, propranolol, and gabapentin without good tolerance.  She returns for an evaluation.  Past Medical History:  Diagnosis Date  . Allergic state 11/22/2011  . Anxiety 11/22/2011  . Back pain 01/31/2013  . BPV (benign positional vertigo) 08/23/2013  . Chicken pox as a child  . Chronic kidney disease    kidney stones  . Dehydration 01/31/2013  . Elevated BP 11/22/2011  . Fatigue 01/11/2012  . Headache(784.0) 11/22/2011  . Hypertension   . Kidney stone 12/21/2012  . Other and unspecified hyperlipidemia 03/07/2014  . Overweight(278.02)   . Pharyngitis 01/11/2012  . Plantar fasciitis 01/11/2012  . Preventative health care 08/18/2013  . 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 Sinusitis 11/22/2011  . Sleep apnea 04/03/2012   setting 2  . Visual changes 11/22/2011    Past Surgical History:  Procedure Laterality Date  . GUM SURGERY    . PERINEAL BODY REPAIR     4 th degree tear with fistula between vagina and retum requiring 3 surgeries for correction  . rectal fistula repair     x 2  . WISDOM TOOTH EXTRACTION      Family History  Problem Relation Age of Onset  . Diabetes Mother        type 2  . Hypertension Mother   . Hyperlipidemia Mother   . Hypertension Father   . Hyperlipidemia Father   . Aneurysm Father        abdominal aorta  . Cancer Maternal Grandmother 82       colon, ovarian  . Other Maternal Grandfather 41       brain  hemorrhage  . Stroke Maternal Grandfather   . Aneurysm Maternal Grandfather   . Heart attack Paternal Grandmother   . Heart disease Paternal Grandmother        MI  . Alzheimer's disease Paternal Grandfather   . Dementia Paternal Grandfather   . Heart attack Paternal Aunt   . Heart disease Paternal Aunt         AAA rupture  . Other Paternal Uncle        cerebreal brain hemorrhage  . Stroke Paternal Uncle   . Aneurysm Paternal Uncle   . Asthma Daughter   . Other Daughter        peanut allergy  . Heart disease Maternal Aunt        s/p cabg. brain aneurysm s/p coiling  . Aneurysm Maternal Aunt        coil behind eye    Social history:  reports that she has never smoked. She has never used smokeless tobacco. She reports that she does not drink alcohol or use drugs.    Allergies  Allergen Reactions  . Topamax [Topiramate]     dizziness  . Zonegran [Zonisamide]     dizziness    Medications:  Prior to Admission medications   Medication Sig Start Date End Date Taking? Authorizing  Provider  amLODipine (NORVASC) 5 MG tablet TAKE 1 TABLET(5 MG) BY MOUTH DAILY 12/25/17  Yes Midge Minium, MD    ROS:  Out of a complete 14 system review of symptoms, the patient complains only of the following symptoms, and all other reviewed systems are negative.  Headache  Blood pressure 121/78, pulse 73, height 5' 5"  (1.651 m), weight 232 lb 8 oz (105.5 kg).  Physical Exam  General: The patient is alert and cooperative at the time of the examination.  The patient is markedly obese.  Skin: No significant peripheral edema is noted.   Neurologic Exam  Mental status: The patient is alert and oriented x 3 at the time of the examination. The patient has apparent normal recent and remote memory, with an apparently normal attention span and concentration ability.   Cranial nerves: Facial symmetry is present. Speech is normal, no aphasia or dysarthria is noted. Extraocular movements are  full. Visual fields are full.  Motor: The patient has good strength in all 4 extremities.  Sensory examination: Soft touch sensation is symmetric on the face, arms, and legs.  Coordination: The patient has good finger-nose-finger and heel-to-shin bilaterally.  Gait and station: The patient has a normal gait. Tandem gait is normal. Romberg is negative. No drift is seen.  Reflexes: Deep tendon reflexes are symmetric.   Assessment/Plan:  1.  Chronic dizziness, resolved  2.  Intermittent headache  The patient is now on no medications with exception of her blood pressure pill, Norvasc.  The patient will follow-up through this office on as-needed basis.  Jill Alexanders MD 02/20/2018 8:59 AM  [facility] [address] [place], [place] [address]  Phone [phone] Fax [phone]

## 2018-03-06 ENCOUNTER — Ambulatory Visit: Payer: 59 | Admitting: Family Medicine

## 2018-03-06 ENCOUNTER — Other Ambulatory Visit: Payer: Self-pay

## 2018-03-06 ENCOUNTER — Encounter: Payer: Self-pay | Admitting: Family Medicine

## 2018-03-06 VITALS — BP 128/86 | HR 79 | Temp 98.5°F | Resp 17 | Ht 65.0 in | Wt 234.4 lb

## 2018-03-06 DIAGNOSIS — I10 Essential (primary) hypertension: Secondary | ICD-10-CM | POA: Diagnosis not present

## 2018-03-06 DIAGNOSIS — F419 Anxiety disorder, unspecified: Secondary | ICD-10-CM | POA: Diagnosis not present

## 2018-03-06 DIAGNOSIS — R42 Dizziness and giddiness: Secondary | ICD-10-CM | POA: Diagnosis not present

## 2018-03-06 LAB — HEPATIC FUNCTION PANEL
ALT: 17 U/L (ref 0–35)
AST: 13 U/L (ref 0–37)
Albumin: 3.6 g/dL (ref 3.5–5.2)
Alkaline Phosphatase: 65 U/L (ref 39–117)
BILIRUBIN DIRECT: 0.1 mg/dL (ref 0.0–0.3)
BILIRUBIN TOTAL: 0.3 mg/dL (ref 0.2–1.2)
Total Protein: 7 g/dL (ref 6.0–8.3)

## 2018-03-06 LAB — CBC WITH DIFFERENTIAL/PLATELET
BASOS PCT: 0.6 % (ref 0.0–3.0)
Basophils Absolute: 0.1 10*3/uL (ref 0.0–0.1)
EOS PCT: 1.6 % (ref 0.0–5.0)
Eosinophils Absolute: 0.2 10*3/uL (ref 0.0–0.7)
HCT: 39.5 % (ref 36.0–46.0)
Hemoglobin: 13.8 g/dL (ref 12.0–15.0)
LYMPHS ABS: 2.6 10*3/uL (ref 0.7–4.0)
LYMPHS PCT: 24.9 % (ref 12.0–46.0)
MCHC: 35 g/dL (ref 30.0–36.0)
MCV: 90.5 fl (ref 78.0–100.0)
MONO ABS: 0.7 10*3/uL (ref 0.1–1.0)
Monocytes Relative: 6.3 % (ref 3.0–12.0)
NEUTROS ABS: 7 10*3/uL (ref 1.4–7.7)
NEUTROS PCT: 66.6 % (ref 43.0–77.0)
Platelets: 296 10*3/uL (ref 150.0–400.0)
RBC: 4.37 Mil/uL (ref 3.87–5.11)
RDW: 12.9 % (ref 11.5–15.5)
WBC: 10.5 10*3/uL (ref 4.0–10.5)

## 2018-03-06 LAB — LIPID PANEL
CHOL/HDL RATIO: 4
Cholesterol: 184 mg/dL (ref 0–200)
HDL: 42.4 mg/dL (ref >39.00)
LDL Cholesterol: 118 mg/dL — ABNORMAL HIGH (ref 0–99)
NONHDL: 141.68
Triglycerides: 116 mg/dL (ref 0.0–149.0)
VLDL: 23.2 mg/dL (ref 0.0–40.0)

## 2018-03-06 LAB — BASIC METABOLIC PANEL
BUN: 10 mg/dL (ref 6–23)
CALCIUM: 9.3 mg/dL (ref 8.4–10.5)
CHLORIDE: 106 meq/L (ref 96–112)
CO2: 27 mEq/L (ref 19–32)
CREATININE: 0.68 mg/dL (ref 0.40–1.20)
GFR: 100.12 mL/min (ref >60.00)
Glucose, Bld: 103 mg/dL — ABNORMAL HIGH (ref 70–99)
Potassium: 4.8 mEq/L (ref 3.5–5.1)
Sodium: 138 mEq/L (ref 135–145)

## 2018-03-06 LAB — TSH: TSH: 1.52 u[IU]/mL (ref 0.35–4.50)

## 2018-03-06 MED ORDER — MECLIZINE HCL 25 MG PO TABS: 25.0000 mg | ORAL_TABLET | Freq: Three times a day (TID) | ORAL | 1 refills | Status: DC | PRN

## 2018-03-06 NOTE — Patient Instructions (Signed)
Schedule your complete physical in 6 months We'll notify you of your lab results and make any changes if needed Continue to work on healthy diet and regular exercise- this will also help stress levels Monitor your anxiety and if it starts to occur more frequently or becomes more intrusive- let me know!!! Use the Meclizine as needed Continue the Amlodipine- BP looks fine! Call with any questions or concerns Have a great summer!!!

## 2018-03-06 NOTE — Assessment & Plan Note (Signed)
Chronic problem.  Stable at this time.  UTD w/ neuro.  Needs refill of meclizine to use PRN.  Refill provided.
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
## 2018-03-06 NOTE — Assessment & Plan Note (Signed)
Chronic problem.  Adequate control.  Asymptomatic.  Stressed need for healthy diet and regular exercise to improve BP and facilitate weight loss.  Check labs.  No anticipated med changes.  Will follow.

## 2018-03-07 ENCOUNTER — Encounter: Payer: Self-pay | Admitting: General Practice

## 2018-03-13 ENCOUNTER — Ambulatory Visit (INDEPENDENT_AMBULATORY_CARE_PROVIDER_SITE_OTHER): Payer: 59 | Admitting: Family Medicine

## 2018-03-13 ENCOUNTER — Other Ambulatory Visit: Payer: Self-pay

## 2018-03-13 ENCOUNTER — Encounter: Payer: Self-pay | Admitting: Family Medicine

## 2018-03-13 VITALS — BP 148/92 | HR 79 | Temp 98.0°F | Ht 65.0 in | Wt 233.8 lb

## 2018-03-13 DIAGNOSIS — J029 Acute pharyngitis, unspecified: Secondary | ICD-10-CM

## 2018-03-13 LAB — POCT RAPID STREP A (OFFICE): Rapid Strep A Screen: NEGATIVE

## 2018-03-13 NOTE — Patient Instructions (Signed)
Follow-up in as Needed.    We will call you with your Culture results if they are positive.   Pharyngitis Pharyngitis is a sore throat (pharynx). There is redness, pain, and swelling of your throat. Follow these instructions at home:  Drink enough fluids to keep your pee (urine) clear or pale yellow.  Only take medicine as told by your doctor. ? You may get sick again if you do not take medicine as told. Finish your medicines, even if you start to feel better. ? Do not take aspirin.  Rest.  Rinse your mouth (gargle) with salt water ( tsp of salt per 1 qt of water) every 1-2 hours. This will help the pain.  If you are not at risk for choking, you can suck on hard candy or sore throat lozenges. Contact a doctor if:  You have large, tender lumps on your neck.  You have a rash.  You cough up green, yellow-brown, or bloody spit. Get help right away if:  You have a stiff neck.  You drool or cannot swallow liquids.  You throw up (vomit) or are not able to keep medicine or liquids down.  You have very bad pain that does not go away with medicine.  You have problems breathing (not from a stuffy nose). This information is not intended to replace advice given to you by your health care provider. Make sure you discuss any questions you have with your health care provider. Document Released: 03/26/2008 Document Revised: 03/15/2016 Document Reviewed: 06/15/2013 Elsevier Interactive Patient Education  2017 [employer].

## 2018-03-13 NOTE — Progress Notes (Signed)
Subjective  CC:  Chief Complaint  Patient presents with  . Sore Throat    x 1 day     HPI: Erika Weaver is a 42 y.o. female who presents to the office today to address the problems listed above in the chief complaint.  C/o sore throat, mild URI sxs without fever. No SOB or GI sxs. No known exposure ot strep or mono. OTC analgesics have been used with minimal or mild relief. Eating and drinking OK.  Has h/o recurrent strep, as child and 2 months ago. Works at day care. Using advil for pain.  I reviewed the patients updated PMH, FH, and SocHx.    Patient Active Problem List   Diagnosis Date Noted  . Vitamin D deficiency 09/05/2017  . Generalized abdominal pain 02/16/2016  . Dizziness 12/29/2015  . Other headache syndrome 12/29/2015  . Retrosternal chest pain 11/08/2015  . GERD (gastroesophageal reflux disease) 11/08/2015  . Essential hypertension 09/09/2014  . Hyperlipidemia, mixed 03/07/2014  . BPV (benign positional vertigo) 08/23/2013  . Preventative health care 08/18/2013  . Bartholin cyst 03/01/2013  . Back pain 01/31/2013  . 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 Kidney stone 12/21/2012  . Sleep apnea 04/03/2012  . Plantar fasciitis 01/11/2012  . Fatigue 01/11/2012  . Headache 11/22/2011  . Allergic state 11/22/2011  . Anxiety 11/22/2011  . Obesity (BMI 35.0-39.9 without comorbidity)    Current Meds  Medication Sig  . amLODipine (NORVASC) 5 MG tablet TAKE 1 TABLET(5 MG) BY MOUTH DAILY  . meclizine (ANTIVERT) 25 MG tablet Take 1 tablet (25 mg total) by mouth 3 (three) times daily as needed for dizziness.    Allergies: Patient is allergic to topamax [topiramate] and zonegran [zonisamide].  Review of Systems: Constitutional: Negative for fever malaise or anorexia Cardiovascular: negative for chest pain Respiratory: negative for SOB or persistent cough Gastrointestinal: negative for abdominal pain  Objective  Vitals: BP (!) 148/92   Pulse 79   Temp 98 F  (36.7 C)   Ht 5' 5"  (1.651 m)   Wt 233 lb 12.8 oz (106.1 kg)   LMP 03/11/2018   BMI 38.91 kg/m  General: no acute distress , A&Ox3 HEENT: PEERL, conjunctiva normal, bilateral EAC and TMs are normal. Nares normal. Oropharynx moist with erythematous posterior pharynx without exudate, + cervical LAD, 2+ cryptic tonsils, midline uvula, neck is supple Cardiovascular:  RRR without murmur or gallop.  Respiratory:  Good breath sounds bilaterally, CTAB with normal respiratory effort Skin:  Warm, no rashes  Rapid strep test negative.  Culture pending  Assessment  1. Sore throat      Plan   Likely viral but given history, await culture. Supportive care with advil, tylenol, gargles etc discussed. RTO if sxs persist or worsen.  Elevated BP today: pain response. Follow at home.   Follow up: prn    Commons side effects, risks, benefits, and alternatives for medications and treatment plan prescribed today were discussed, and the patient expressed understanding of the given instructions. Patient is instructed to call or message via MyChart if he/she has any questions or concerns regarding our treatment plan. No barriers to understanding were identified. We discussed Red Flag symptoms and signs in detail. Patient expressed understanding regarding what to do in case of urgent or emergency type symptoms.   Medication list was reconciled, printed and provided to the patient in AVS. Patient instructions and summary information was reviewed with the patient as documented in the AVS. This note was prepared with  assistance of Systems analyst. Occasional wrong-word or sound-a-like substitutions may have occurred due to the inherent limitations of voice recognition software  Orders Placed This Encounter  Procedures  . Culture, Group A Strep  . POCT rapid strep A   No orders of the defined types were placed in this encounter.

## 2018-03-15 LAB — CULTURE, GROUP A STREP
MICRO NUMBER: 90630113
SPECIMEN QUALITY: ADEQUATE

## 2018-03-25 ENCOUNTER — Other Ambulatory Visit: Payer: Self-pay | Admitting: Family Medicine

## 2018-03-25 DIAGNOSIS — Z1231 Encounter for screening mammogram for malignant neoplasm of breast: Secondary | ICD-10-CM

## 2018-04-02 ENCOUNTER — Other Ambulatory Visit: Payer: Self-pay | Admitting: Family Medicine

## 2018-05-07 ENCOUNTER — Ambulatory Visit
Admission: RE | Admit: 2018-05-07 | Discharge: 2018-05-07 | Disposition: A | Discharge status: Home / Self Care | Payer: 59 | Source: Ambulatory Visit | Attending: Family Medicine | Admitting: Family Medicine

## 2018-05-07 DIAGNOSIS — Z1231 Encounter for screening mammogram for malignant neoplasm of breast: Secondary | ICD-10-CM

## 2018-05-12 ENCOUNTER — Encounter: Payer: Self-pay | Admitting: General Practice

## 2018-07-08 ENCOUNTER — Other Ambulatory Visit: Payer: Self-pay | Admitting: Family Medicine

## 2018-07-31 ENCOUNTER — Encounter: Payer: Self-pay | Admitting: Family Medicine

## 2018-07-31 ENCOUNTER — Ambulatory Visit (INDEPENDENT_AMBULATORY_CARE_PROVIDER_SITE_OTHER): Payer: 59 | Admitting: Family Medicine

## 2018-07-31 ENCOUNTER — Other Ambulatory Visit: Payer: Self-pay

## 2018-07-31 VITALS — BP 150/90 | HR 89 | Temp 98.1°F | Resp 17 | Ht 65.0 in | Wt 232.1 lb

## 2018-07-31 DIAGNOSIS — I10 Essential (primary) hypertension: Secondary | ICD-10-CM | POA: Diagnosis not present

## 2018-07-31 DIAGNOSIS — Z23 Encounter for immunization: Secondary | ICD-10-CM

## 2018-07-31 DIAGNOSIS — R42 Dizziness and giddiness: Secondary | ICD-10-CM | POA: Diagnosis not present

## 2018-07-31 MED ORDER — AMLODIPINE BESYLATE 5 MG PO TABS: ORAL_TABLET | ORAL | 0 refills | Status: DC

## 2018-07-31 MED ORDER — SUMATRIPTAN SUCCINATE 50 MG PO TABS: 50.0000 mg | ORAL_TABLET | ORAL | 3 refills | Status: DC | PRN

## 2018-07-31 NOTE — Assessment & Plan Note (Signed)
Deteriorated.  I suspect this is due in part to her stress level and current dizziness.  She has been on Amlodipine since 2016- so her dizziness is not related to her medication.  Will not adjust medications at this time but continue to follow.

## 2018-07-31 NOTE — Progress Notes (Signed)
   Subjective:    Patient ID: Erika Weaver, female    DOB: [birth year], 42 y.o.   MRN: [id number]  HPI Dizziness- 'it's nothing spinning'.  'something's off'.  sxs started 1 week ago.  sxs are intermittent.  No relief w/ meclizine.  Pt reports good water intake.  Pt has seen neurology x2.  Has had multiple MRIs, an MRA w/o improvement.  Has seen ENT, Allergy, chiropractor, migraine specialist, physical therapy.  'i've been dealing with this for so long'.  Pt is crying in office.  Only relief is sleep.  Dizziness is not related to position changes.  HTN- BP has been 'normal' at home but elevated here today.  Pt is upset and dizzy.   Review of Systems For ROS see HPI     Objective:   Physical Exam  Constitutional: She is oriented to person, place, and time. She appears well-developed and well-nourished. No distress.  HENT:  Head: Normocephalic and atraumatic.  Eyes: Pupils are equal, round, and reactive to light. Conjunctivae and EOM are normal.  Neck: Normal range of motion. Neck supple. No thyromegaly present.  Cardiovascular: Normal rate, regular rhythm, normal heart sounds and intact distal pulses.  No murmur heard. Pulmonary/Chest: Effort normal and breath sounds normal. No respiratory distress.  Abdominal: Soft. She exhibits no distension. There is no tenderness.  Musculoskeletal: She exhibits no edema.  Lymphadenopathy:    She has no cervical adenopathy.  Neurological: She is alert and oriented to person, place, and time. She displays normal reflexes. No cranial nerve deficit. Coordination normal.  Skin: Skin is warm and dry.  Psychiatric: Her behavior is normal.  Tearful, anxious  Vitals reviewed.         Assessment & Plan:  Dizziness- ongoing issue for pt.  She has seen 2 neurologists, ENT, Allergy, Cards, Migraine specialist, PT, Chiropractor.  No one has been able to determine cause.  I wonder if this could be a migraine variant as this is episodic.  Start Imitrex  and monitor for improvement.  Refer to [facility].  Will follow.

## 2018-07-31 NOTE — Patient Instructions (Signed)
Follow up in 1 month to recheck BP We'll call you with your [facility] appt Continue to drink plenty of fluids Continue your Amlodipine daily Take the Imitrex when you get home to see if it helps the dizziness Call with any questions or concerns Hang in there!

## 2018-08-05 ENCOUNTER — Telehealth: Payer: Self-pay | Admitting: Emergency Medicine

## 2018-08-05 NOTE — Telephone Encounter (Signed)
Spoke with patient to let her know she needed to contact GYN for this.  Stated verbal understanding.

## 2018-08-05 NOTE — Telephone Encounter (Signed)
If pt is concerned about estrogen or testosterone levels, that would be something to discuss with GYN.  Otherwise, we can schedule an appt for her to discuss

## 2018-08-05 NOTE — Telephone Encounter (Signed)
Tc from Patient she wants to discuss coming in for labs to have hormone levels checked.   Please advise.   Doloris Hall,  LPN

## 2018-08-28 ENCOUNTER — Ambulatory Visit: Payer: 59 | Admitting: Family Medicine

## 2018-08-28 DIAGNOSIS — R42 Dizziness and giddiness: Secondary | ICD-10-CM | POA: Diagnosis not present

## 2018-09-01 ENCOUNTER — Ambulatory Visit: Payer: 59 | Admitting: Family Medicine

## 2018-09-04 ENCOUNTER — Inpatient Hospital Stay: Payer: 59 | Attending: Hematology & Oncology | Admitting: Hematology & Oncology

## 2018-09-04 ENCOUNTER — Inpatient Hospital Stay: Payer: 59

## 2018-09-04 ENCOUNTER — Encounter: Payer: Self-pay | Admitting: Hematology & Oncology

## 2018-09-04 ENCOUNTER — Other Ambulatory Visit: Payer: Self-pay

## 2018-09-04 VITALS — BP 150/78 | HR 75 | Temp 98.6°F | Resp 18 | Wt 226.0 lb

## 2018-09-04 DIAGNOSIS — N200 Calculus of kidney: Secondary | ICD-10-CM

## 2018-09-04 DIAGNOSIS — D72829 Elevated white blood cell count, unspecified: Secondary | ICD-10-CM

## 2018-09-04 DIAGNOSIS — Z79899 Other long term (current) drug therapy: Secondary | ICD-10-CM | POA: Diagnosis not present

## 2018-09-04 DIAGNOSIS — R42 Dizziness and giddiness: Secondary | ICD-10-CM | POA: Insufficient documentation

## 2018-09-04 DIAGNOSIS — D72823 Leukemoid reaction: Secondary | ICD-10-CM | POA: Diagnosis not present

## 2018-09-04 LAB — CBC WITH DIFFERENTIAL (CANCER CENTER ONLY)
Abs Immature Granulocytes: 0.03 10*3/uL (ref 0.00–0.07)
BASOS ABS: 0.1 10*3/uL (ref 0.0–0.1)
BASOS PCT: 1 %
EOS PCT: 1 %
Eosinophils Absolute: 0.1 10*3/uL (ref 0.0–0.5)
HCT: 42.2 % (ref 36.0–46.0)
Hemoglobin: 13.9 g/dL (ref 12.0–15.0)
Immature Granulocytes: 0 %
LYMPHS PCT: 27 %
Lymphs Abs: 2.8 10*3/uL (ref 0.7–4.0)
MCH: 30.5 pg (ref 26.0–34.0)
MCHC: 32.9 g/dL (ref 30.0–36.0)
MCV: 92.5 fL (ref 80.0–100.0)
Monocytes Absolute: 0.8 10*3/uL (ref 0.1–1.0)
Monocytes Relative: 8 %
NEUTROS ABS: 6.6 10*3/uL (ref 1.7–7.7)
NEUTROS PCT: 63 %
NRBC: 0 % (ref 0.0–0.2)
PLATELETS: 317 10*3/uL (ref 150–400)
RBC: 4.56 MIL/uL (ref 3.87–5.11)
RDW: 12.1 % (ref 11.5–15.5)
WBC Count: 10.5 10*3/uL (ref 4.0–10.5)

## 2018-09-04 NOTE — Progress Notes (Signed)
Hematology and Oncology Follow Up Visit  Erika Weaver [id number] Aug 06, 1976 42 y.o. 09/04/2018   Principle Diagnosis:   Leukocytosis-leukemoid reaction  Current Therapy:    Observation     Interim History:  Erika Weaver is back for follow-up.  She is doing quite well.  She has had no problems since we last saw her 6 months ago.  As always, she is been busy with 1 of her daughters who plays softball.  This has taken up a lot of her summertime.    She is now exercising.  She started exercising 3 days ago.  She goes to [employer].  She is had no problem with infections.  She is had no fever.  She has had no change in bowel or bladder habits.  She had problems with dizziness.  She was found to have  PPPD.  She went to [facility] to see a neurologist.  They diagnosed her there.  She was put on sertraline.  This seems to be helping quite a bit.  She has had no issues with her monthly cycles.  She is had no rashes.  She has had no leg swelling.  Overall, her performance status is ECOG 0.  Medications:  Current Outpatient Medications:  .  sertraline (ZOLOFT) 25 MG tablet, Take 25 mg by mouth daily., Disp: , Rfl:  .  amLODipine (NORVASC) 5 MG tablet, TAKE 1 TABLET(5 MG) BY MOUTH DAILY, Disp: 90 tablet, Rfl: 0 .  Multiple Vitamin (MULTI-VITAMINS) TABS, Take by mouth., Disp: , Rfl:   Allergies:  Allergies  Allergen Reactions  . Topamax [Topiramate]     dizziness  . Zonegran [Zonisamide]     dizziness    Past Medical History, Surgical history, Social history, and Family History were reviewed and updated.  Review of Systems: Review of Systems  Constitutional: Negative.   HENT: Negative.   Eyes: Negative.   Respiratory: Negative.   Cardiovascular: Negative.   Gastrointestinal: Negative.   Genitourinary: Negative.   Musculoskeletal: Negative.   Skin: Negative.   Neurological: Negative.   Endo/Heme/Allergies: Negative.   Psychiatric/Behavioral: Negative.        Physical Exam:  weight is 226 lb (102.5 kg). Her oral temperature is 98.6 F (37 C). Her blood pressure is 150/78 (abnormal) and her pulse is 75. Her respiration is 18 and oxygen saturation is 100%.   Wt Readings from Last 3 Encounters:  09/04/18 226 lb (102.5 kg)  07/31/18 232 lb 2 oz (105.3 kg)  03/13/18 233 lb 12.8 oz (106.1 kg)      Physical Exam  Constitutional: She is oriented to person, place, and time.  HENT:  Head: Normocephalic and atraumatic.  Mouth/Throat: Oropharynx is clear and moist.  Eyes: Pupils are equal, round, and reactive to light. EOM are normal.  Neck: Normal range of motion.  Cardiovascular: Normal rate, regular rhythm and normal heart sounds.  Pulmonary/Chest: Effort normal and breath sounds normal.  Abdominal: Soft. Bowel sounds are normal.  Musculoskeletal: Normal range of motion. She exhibits no edema, tenderness or deformity.  Lymphadenopathy:    She has no cervical adenopathy.  Neurological: She is alert and oriented to person, place, and time.  Skin: Skin is warm and dry. No rash noted. No erythema.  Psychiatric: She has a normal mood and affect. Her behavior is normal. Judgment and thought content normal.  Vitals reviewed.  .   Lab Results  Component Value Date   WBC 10.5 09/04/2018   HGB 13.9 09/04/2018   HCT 42.2  09/04/2018   MCV 92.5 09/04/2018   PLT 317 09/04/2018     Chemistry      Component Value Date/Time   NA 138 03/06/2018 0856   K 4.8 03/06/2018 0856   CL 106 03/06/2018 0856   CO2 27 03/06/2018 0856   BUN 10 03/06/2018 0856   CREATININE 0.68 03/06/2018 0856   CREATININE 0.79 01/02/2018 0938   CREATININE 0.72 05/27/2014 0944      Component Value Date/Time   CALCIUM 9.3 03/06/2018 0856   ALKPHOS 65 03/06/2018 0856   AST 13 03/06/2018 0856   AST 23 01/02/2018 0938   ALT 17 03/06/2018 0856   ALT 35 01/02/2018 0938   BILITOT 0.3 03/06/2018 0856   BILITOT 0.5 01/02/2018 0938         Impression and  Plan: [name] is a 42 year old white female.  Everything is holding quite steady with her white cells.  I looked at her blood smear.  I do not see any immature white blood cells.  She had no hypersegmented polys.  There were no immature myeloid or lymphoid cells.  I will plan to get her back in 6 months.  Volanda Napoleon, MD 11/14/201910:12 AM

## 2018-09-08 LAB — SOLUBLE TRANSFERRIN RECEPTOR: Transferrin Receptor: 17.5 nmol/L (ref 12.2–27.3)

## 2018-09-21 LAB — HM PAP SMEAR

## 2018-11-13 DIAGNOSIS — Z6836 Body mass index (BMI) 36.0-36.9, adult: Secondary | ICD-10-CM | POA: Diagnosis not present

## 2018-11-13 DIAGNOSIS — Z01419 Encounter for gynecological examination (general) (routine) without abnormal findings: Secondary | ICD-10-CM | POA: Diagnosis not present

## 2019-01-28 ENCOUNTER — Other Ambulatory Visit: Payer: Self-pay | Admitting: Family Medicine

## 2019-01-28 NOTE — Telephone Encounter (Signed)
Patient scheduled for BP/med f/u on 02/02/19

## 2019-02-02 ENCOUNTER — Ambulatory Visit: Payer: 59 | Admitting: Family Medicine

## 2019-02-22 IMAGING — MG DIGITAL SCREENING BILATERAL MAMMOGRAM WITH CAD
5 series · 5 of 5 positions shown · non-contrast
Comparison: Previous exam(s).

CLINICAL DATA: Screening.

EXAM:
DIGITAL SCREENING BILATERAL MAMMOGRAM WITH CAD

[L MLO (1 of 2)]
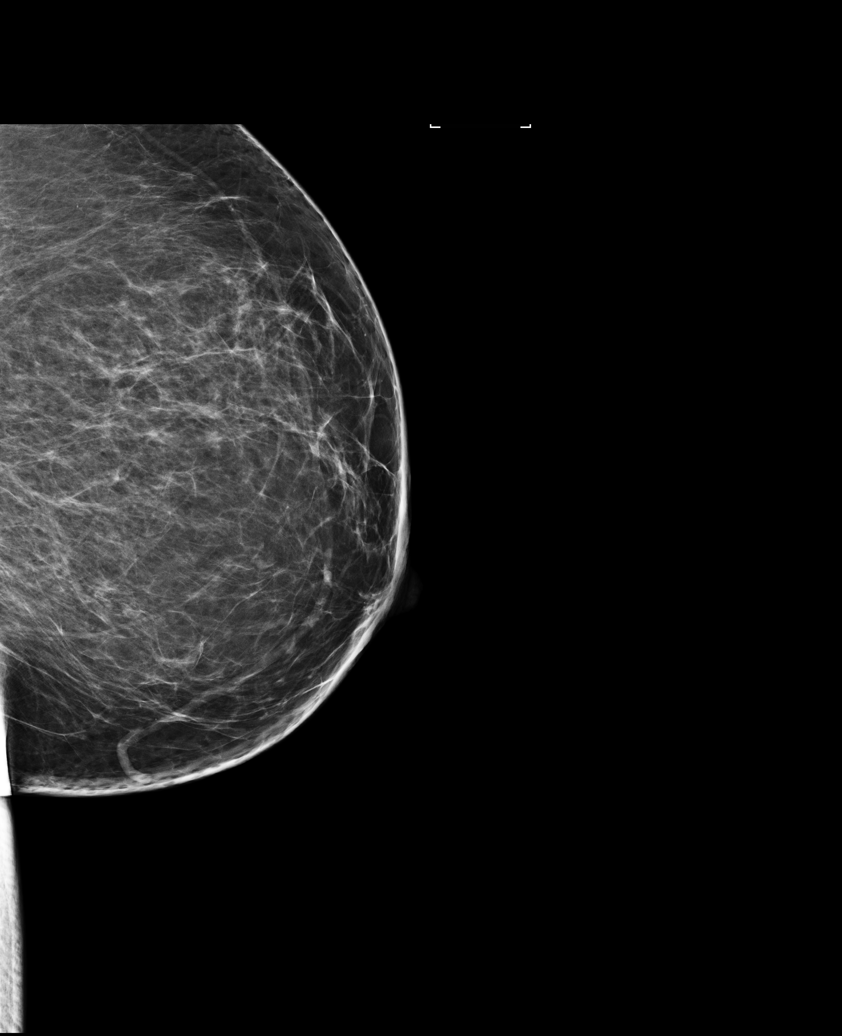

[L CC]
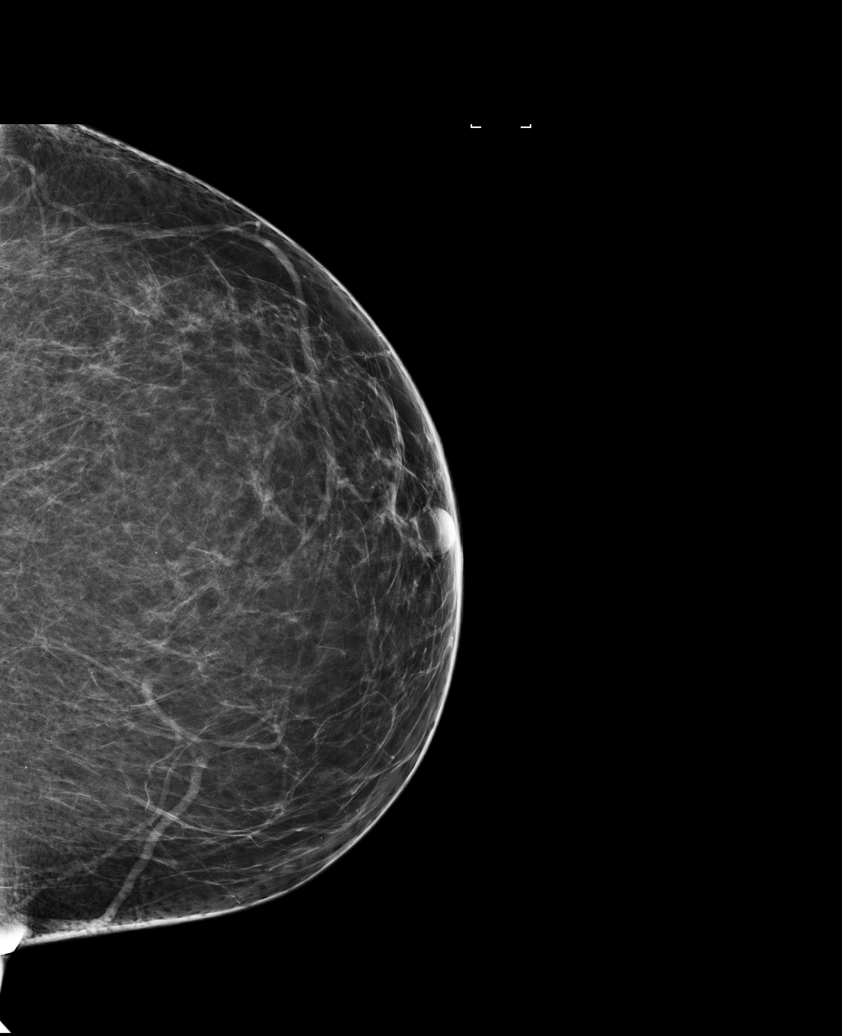

[R CC]
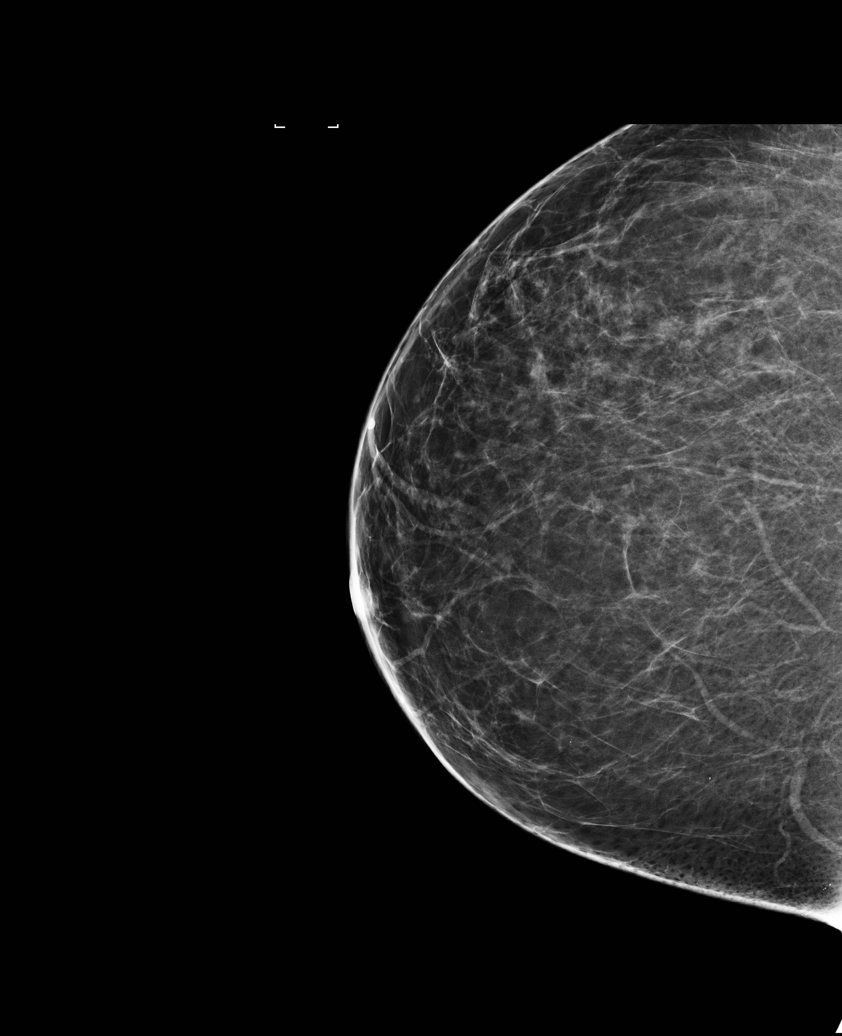

[R MLO]
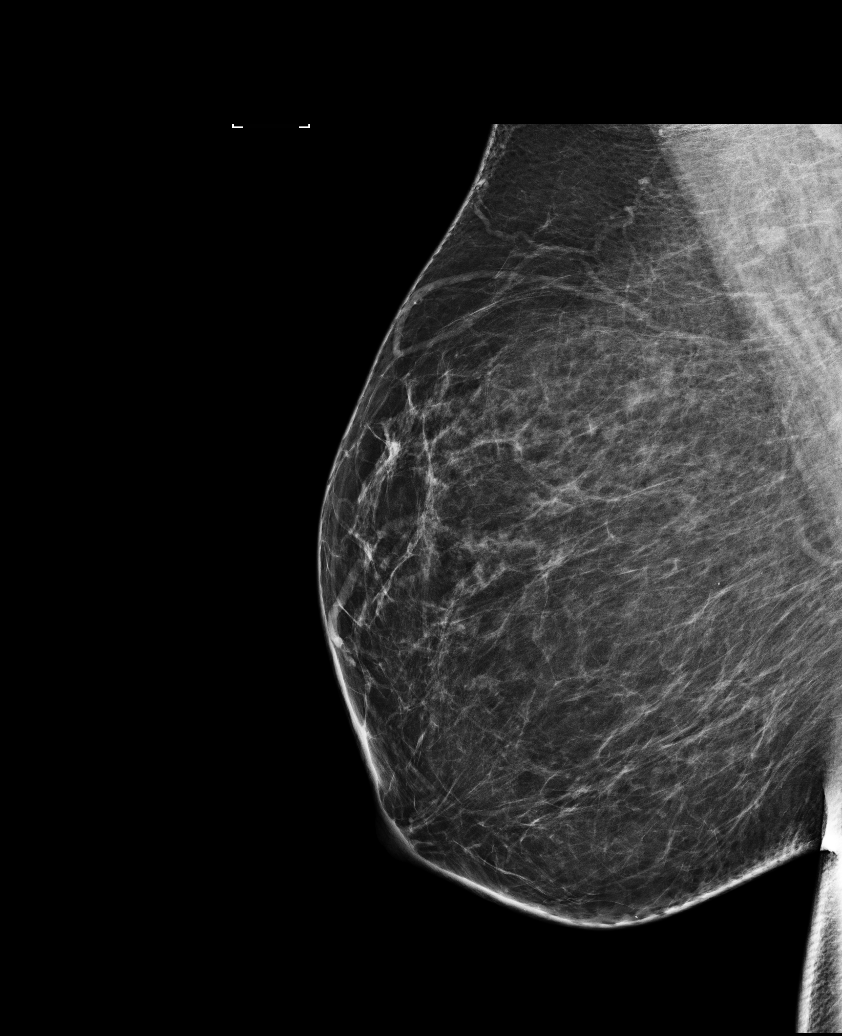

[L MLO (2 of 2)]
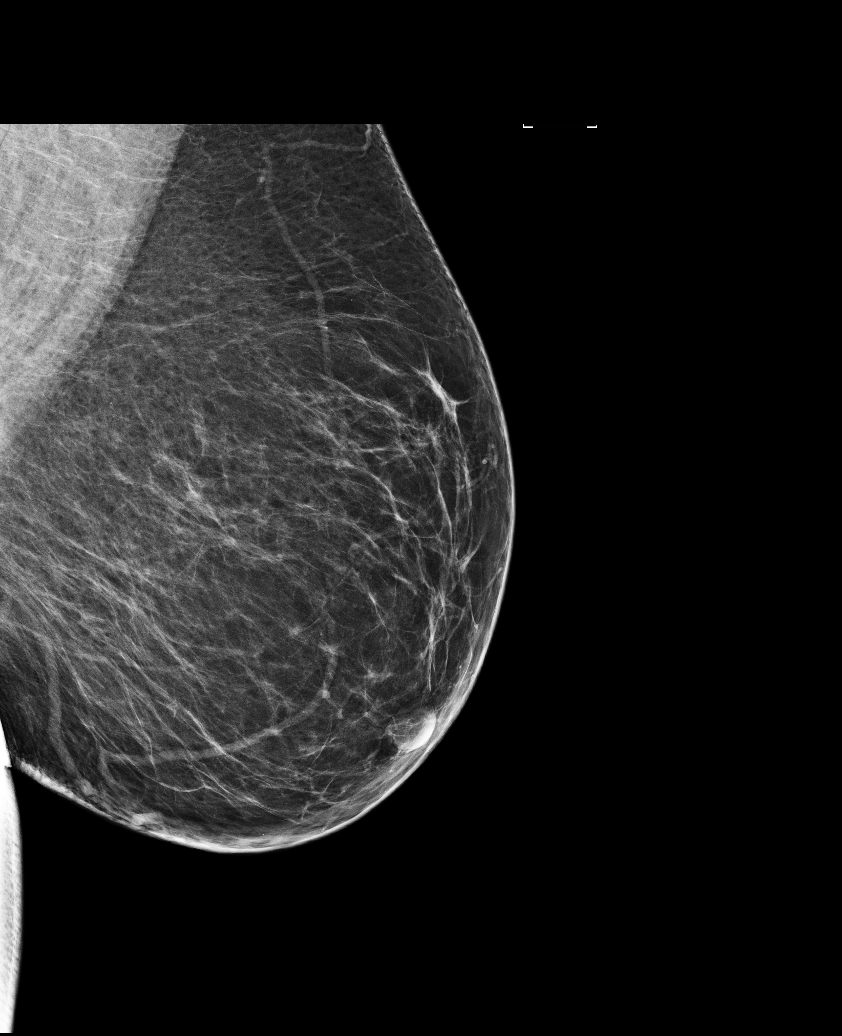

[5 of 5 positions shown; findings below may reference images not displayed]

ACR Breast Density Category b: There are scattered areas of
fibroglandular density.
FINDINGS: There are no findings suspicious for malignancy. Images were
processed with CAD.
IMPRESSION: No mammographic evidence of malignancy. A result letter of this
screening mammogram will be mailed directly to the patient.

RECOMMENDATION:
Screening mammogram in one year. (Code:AS-G-LCT)

BI-RADS CATEGORY  1: Negative.

## 2019-02-24 ENCOUNTER — Other Ambulatory Visit: Payer: Self-pay | Admitting: Family Medicine

## 2019-03-05 ENCOUNTER — Other Ambulatory Visit: Payer: 59

## 2019-03-05 ENCOUNTER — Ambulatory Visit: Payer: 59 | Admitting: Hematology & Oncology

## 2019-03-23 ENCOUNTER — Other Ambulatory Visit: Payer: Self-pay | Admitting: Family Medicine

## 2019-04-02 ENCOUNTER — Other Ambulatory Visit: Payer: Self-pay | Admitting: Family Medicine

## 2019-04-02 DIAGNOSIS — Z1231 Encounter for screening mammogram for malignant neoplasm of breast: Secondary | ICD-10-CM

## 2019-04-19 ENCOUNTER — Other Ambulatory Visit: Payer: Self-pay | Admitting: Family Medicine

## 2019-05-03 ENCOUNTER — Other Ambulatory Visit: Payer: Self-pay | Admitting: Family Medicine

## 2019-05-07 ENCOUNTER — Inpatient Hospital Stay: Payer: 59 | Attending: Hematology & Oncology

## 2019-05-07 ENCOUNTER — Inpatient Hospital Stay (HOSPITAL_BASED_OUTPATIENT_CLINIC_OR_DEPARTMENT_OTHER): Payer: 59 | Admitting: Hematology & Oncology

## 2019-05-07 ENCOUNTER — Other Ambulatory Visit: Payer: Self-pay

## 2019-05-07 ENCOUNTER — Encounter: Payer: Self-pay | Admitting: Hematology & Oncology

## 2019-05-07 VITALS — BP 137/72 | HR 78 | Temp 97.7°F | Resp 19 | Wt 219.0 lb

## 2019-05-07 DIAGNOSIS — D72823 Leukemoid reaction: Secondary | ICD-10-CM

## 2019-05-07 DIAGNOSIS — N200 Calculus of kidney: Secondary | ICD-10-CM

## 2019-05-07 DIAGNOSIS — Z79899 Other long term (current) drug therapy: Secondary | ICD-10-CM

## 2019-05-07 LAB — CBC WITH DIFFERENTIAL (CANCER CENTER ONLY)
Abs Immature Granulocytes: 0.04 10*3/uL (ref 0.00–0.07)
Basophils Absolute: 0.1 10*3/uL (ref 0.0–0.1)
Basophils Relative: 1 %
Eosinophils Absolute: 0.3 10*3/uL (ref 0.0–0.5)
Eosinophils Relative: 3 %
HCT: 40.1 % (ref 36.0–46.0)
Hemoglobin: 13.4 g/dL (ref 12.0–15.0)
Immature Granulocytes: 0 %
Lymphocytes Relative: 27 %
Lymphs Abs: 2.8 10*3/uL (ref 0.7–4.0)
MCH: 30.7 pg (ref 26.0–34.0)
MCHC: 33.4 g/dL (ref 30.0–36.0)
MCV: 91.8 fL (ref 80.0–100.0)
Monocytes Absolute: 0.8 10*3/uL (ref 0.1–1.0)
Monocytes Relative: 7 %
Neutro Abs: 6.4 10*3/uL (ref 1.7–7.7)
Neutrophils Relative %: 62 %
Platelet Count: 278 10*3/uL (ref 150–400)
RBC: 4.37 MIL/uL (ref 3.87–5.11)
RDW: 12.3 % (ref 11.5–15.5)
WBC Count: 10.3 10*3/uL (ref 4.0–10.5)
nRBC: 0 % (ref 0.0–0.2)

## 2019-05-07 LAB — SAVE SMEAR(SSMR), FOR PROVIDER SLIDE REVIEW

## 2019-05-07 LAB — CMP (CANCER CENTER ONLY)
ALT: 13 U/L (ref 0–44)
AST: 12 U/L — ABNORMAL LOW (ref 15–41)
Albumin: 3.8 g/dL (ref 3.5–5.0)
Alkaline Phosphatase: 76 U/L (ref 38–126)
Anion gap: 8 (ref 5–15)
BUN: 9 mg/dL (ref 6–20)
CO2: 29 mmol/L (ref 22–32)
Calcium: 9 mg/dL (ref 8.9–10.3)
Chloride: 103 mmol/L (ref 98–111)
Creatinine: 0.84 mg/dL (ref 0.44–1.00)
GFR, Est AFR Am: >60 mL/min (ref >60)
GFR, Estimated: >60 mL/min (ref >60)
Glucose, Bld: 119 mg/dL — ABNORMAL HIGH (ref 70–99)
Potassium: 3.8 mmol/L (ref 3.5–5.1)
Sodium: 140 mmol/L (ref 135–145)
Total Bilirubin: 0.3 mg/dL (ref 0.3–1.2)
Total Protein: 6.9 g/dL (ref 6.5–8.1)

## 2019-05-07 NOTE — Progress Notes (Signed)
Hematology and Oncology Follow Up Visit  Erika Weaver [id number] 1975/12/18 43 y.o. 05/07/2019   Principle Diagnosis:   Leukocytosis-leukemoid reaction  Current Therapy:    Observation     Interim History:  Erika Weaver is back for follow-up.  She is doing quite well.  She has had no problems since we last saw her 6 months ago.  She is been pretty very proactive with avoiding the coronavirus.  She has been very careful.  She of her daughter.  Her daughter is 14 and placed softball.  She is not been able to play as much softball because of the coronavirus.  There is been no problems with fever.  She has had no cough.  She is had no change in bowel or bladder habits.  Has had no rashes.  There is been no leg swelling.  Overall, her performance status is ECOG 0.  Medications:  Current Outpatient Medications:  .  amLODipine (NORVASC) 5 MG tablet, TAKE 1 TABLET BY MOUTH DAILY, Disp: 30 tablet, Rfl: 0 .  Multiple Vitamin (MULTI-VITAMINS) TABS, Take by mouth., Disp: , Rfl:  .  sertraline (ZOLOFT) 25 MG tablet, Take 25 mg by mouth daily., Disp: , Rfl:   Allergies:  Allergies  Allergen Reactions  . Topamax [Topiramate]     dizziness  . Zonegran [Zonisamide]     dizziness    Past Medical History, Surgical history, Social history, and Family History were reviewed and updated.  Review of Systems: Review of Systems  Constitutional: Negative.   HENT: Negative.   Eyes: Negative.   Respiratory: Negative.   Cardiovascular: Negative.   Gastrointestinal: Negative.   Genitourinary: Negative.   Musculoskeletal: Negative.   Skin: Negative.   Neurological: Negative.   Endo/Heme/Allergies: Negative.   Psychiatric/Behavioral: Negative.       Physical Exam:  weight is 219 lb (99.3 kg). Her temporal temperature is 97.7 F (36.5 C). Her blood pressure is 137/72 and her pulse is 78. Her respiration is 19 and oxygen saturation is 100%.   Wt Readings from Last 3 Encounters:  05/07/19  219 lb (99.3 kg)  09/04/18 226 lb (102.5 kg)  07/31/18 232 lb 2 oz (105.3 kg)      Physical Exam Vitals signs reviewed.  HENT:     Head: Normocephalic and atraumatic.  Eyes:     Pupils: Pupils are equal, round, and reactive to light.  Neck:     Musculoskeletal: Normal range of motion.  Cardiovascular:     Rate and Rhythm: Normal rate and regular rhythm.     Heart sounds: Normal heart sounds.  Pulmonary:     Effort: Pulmonary effort is normal.     Breath sounds: Normal breath sounds.  Abdominal:     General: Bowel sounds are normal.     Palpations: Abdomen is soft.  Musculoskeletal: Normal range of motion.        General: No tenderness or deformity.  Lymphadenopathy:     Cervical: No cervical adenopathy.  Skin:    General: Skin is warm and dry.     Findings: No erythema or rash.  Neurological:     Mental Status: She is alert and oriented to person, place, and time.  Psychiatric:        Behavior: Behavior normal.        Thought Content: Thought content normal.        Judgment: Judgment normal.    .   Lab Results  Component Value Date   WBC 10.3 05/07/2019  HGB 13.4 05/07/2019   HCT 40.1 05/07/2019   MCV 91.8 05/07/2019   PLT 278 05/07/2019     Chemistry      Component Value Date/Time   NA 140 05/07/2019 0956   K 3.8 05/07/2019 0956   CL 103 05/07/2019 0956   CO2 29 05/07/2019 0956   BUN 9 05/07/2019 0956   CREATININE 0.84 05/07/2019 0956   CREATININE 0.72 05/27/2014 0944      Component Value Date/Time   CALCIUM 9.0 05/07/2019 0956   ALKPHOS 76 05/07/2019 0956   AST 12 (L) 05/07/2019 0956   ALT 13 05/07/2019 0956   BILITOT 0.3 05/07/2019 0956         Impression and Plan: Erika Weaver is a 43 year old white female.  Everything is holding quite steady with her white cells.  I looked at her blood smear.  I do not see any immature white blood cells.  She had no hypersegmented polys.  There were no immature myeloid or lymphoid cells.  Since  everything is been going steady for a year, I think we get her back in 1 year.  I certainly do not think that we have a hematologic issue with her.  This I am very happy about.  Marland Kitchen Volanda Napoleon, MD 7/16/202011:57 AM

## 2019-05-15 LAB — HM MAMMOGRAPHY: HM Mammogram: NORMAL (ref 0–4)

## 2019-05-18 ENCOUNTER — Ambulatory Visit
Admission: RE | Admit: 2019-05-18 | Discharge: 2019-05-18 | Disposition: A | Discharge status: Home / Self Care | Payer: 59 | Source: Ambulatory Visit | Attending: Family Medicine | Admitting: Family Medicine

## 2019-05-18 ENCOUNTER — Other Ambulatory Visit: Payer: Self-pay

## 2019-05-18 DIAGNOSIS — Z1231 Encounter for screening mammogram for malignant neoplasm of breast: Secondary | ICD-10-CM

## 2019-05-19 ENCOUNTER — Ambulatory Visit: Payer: 59

## 2019-05-29 ENCOUNTER — Ambulatory Visit: Payer: 59

## 2019-05-29 ENCOUNTER — Other Ambulatory Visit: Payer: Self-pay | Admitting: Family Medicine

## 2019-06-05 ENCOUNTER — Encounter: Payer: Self-pay | Admitting: Family Medicine

## 2019-06-05 ENCOUNTER — Ambulatory Visit (INDEPENDENT_AMBULATORY_CARE_PROVIDER_SITE_OTHER): Payer: 59 | Admitting: Family Medicine

## 2019-06-05 ENCOUNTER — Other Ambulatory Visit: Payer: Self-pay

## 2019-06-05 VITALS — BP 138/80 | HR 83 | Temp 98.2°F | Ht 65.0 in | Wt 216.5 lb

## 2019-06-05 DIAGNOSIS — I10 Essential (primary) hypertension: Secondary | ICD-10-CM

## 2019-06-05 DIAGNOSIS — E782 Mixed hyperlipidemia: Secondary | ICD-10-CM | POA: Diagnosis not present

## 2019-06-05 MED ORDER — AMLODIPINE BESYLATE 5 MG PO TABS: 5.0000 mg | ORAL_TABLET | Freq: Every day | ORAL | 1 refills | Status: DC

## 2019-06-05 NOTE — Progress Notes (Signed)
   Virtual Visit via Video   I connected with patient on 06/05/19 at  8:00 AM EDT by a video enabled telemedicine application and verified that I am speaking with the correct person using two identifiers.  Location patient: Home Location provider: [facility], Office Persons participating in the virtual visit: Patient, Provider, [place] (Jess B)  I discussed the limitations of evaluation and management by telemedicine and the availability of in person appointments. The patient expressed understanding and agreed to proceed.  Subjective:   HPI:   HTN- chronic problem, on Amlodipine.  No CP, SOB, HAs, visual changes, edema.  Hyperlipidemia- pt has been attempting to control w/ diet and exercise.  Down 16 lbs since last visit.  No abd pain, N/V.  Obesity- BMI now 36  Down 16 lbs since last visit.  'I don't know how'.  Pt has increased water intake and is walking regularly.  ROS:   See pertinent positives and negatives per HPI.  Patient Active Problem List   Diagnosis Date Noted  . Vitamin D deficiency 09/05/2017  . Generalized abdominal pain 02/16/2016  . Dizziness 12/29/2015  . Other headache syndrome 12/29/2015  . Retrosternal chest pain 11/08/2015  . GERD (gastroesophageal reflux disease) 11/08/2015  . Essential hypertension 09/09/2014  . Hyperlipidemia, mixed 03/07/2014  . BPV (benign positional vertigo) 08/23/2013  . Preventative health care 08/18/2013  . Bartholin cyst 03/01/2013  . Back pain 01/31/2013  . 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 Kidney stone 12/21/2012  . Sleep apnea 04/03/2012  . Plantar fasciitis 01/11/2012  . Fatigue 01/11/2012  . Headache 11/22/2011  . Allergic state 11/22/2011  . Anxiety 11/22/2011  . Obesity (BMI 35.0-39.9 without comorbidity)     Social History   Tobacco Use  . Smoking status: Never Smoker  . Smokeless tobacco: Never Used  Substance Use Topics  . Alcohol use: No    Current Outpatient Medications:  .  amLODipine  (NORVASC) 5 MG tablet, TAKE 1 TABLET BY MOUTH DAILY, Disp: 30 tablet, Rfl: 0 .  Multiple Vitamin (MULTI-VITAMINS) TABS, Take by mouth., Disp: , Rfl:  .  sertraline (ZOLOFT) 25 MG tablet, Take 25 mg by mouth daily., Disp: , Rfl:   Allergies  Allergen Reactions  . Topamax [Topiramate]     dizziness  . Zonegran [Zonisamide]     dizziness    Objective:   BP 138/80   Pulse 83   Temp 98.2 F (36.8 C) (Oral)   Ht 5' 5"  (1.651 m)   Wt 216 lb 8 oz (98.2 kg)   BMI 36.03 kg/m   AAOx3, NAD NCAT, EOMI No obvious CN deficits Coloring WNL Pt is able to speak clearly, coherently without shortness of breath or increased work of breathing.  Thought process is linear.  Mood is appropriate.   Assessment and Plan:   HTN- chronic problem.  Adequate control.  Asymptomatic.  Check labs.  No anticipated med changes.  Hyperlipidemia- pt attempting to control w/ diet and exercise.  Check labs and adjust tx prn.  Obesity- pt is down 16 lbs since last visit.  She reports she is drinking water and has increased her walking but otherwise isn't sure how or why she is losing weight.  This could be a red flag for diabetes so we will check sugars.  Applauded her efforts at walking and encouraged her to work on healthy diet.  Will follow.   Annye Asa, MD 06/05/2019

## 2019-06-05 NOTE — Progress Notes (Signed)
I have discussed the procedure for the virtual visit with the patient who has given consent to proceed with assessment and treatment.   Jessica L Brodmerkel, CMA     

## 2019-06-08 ENCOUNTER — Ambulatory Visit (INDEPENDENT_AMBULATORY_CARE_PROVIDER_SITE_OTHER): Payer: 59

## 2019-06-08 ENCOUNTER — Other Ambulatory Visit: Payer: Self-pay

## 2019-06-08 DIAGNOSIS — I10 Essential (primary) hypertension: Secondary | ICD-10-CM

## 2019-06-08 DIAGNOSIS — E782 Mixed hyperlipidemia: Secondary | ICD-10-CM | POA: Diagnosis not present

## 2019-06-08 LAB — LDL CHOLESTEROL, DIRECT: Direct LDL: 130 mg/dL

## 2019-06-08 LAB — LIPID PANEL
Cholesterol: 185 mg/dL (ref 0–200)
HDL: 42.6 mg/dL (ref >39.00)
NonHDL: 142.49
Total CHOL/HDL Ratio: 4
Triglycerides: 233 mg/dL — ABNORMAL HIGH (ref 0.0–149.0)
VLDL: 46.6 mg/dL — ABNORMAL HIGH (ref 0.0–40.0)

## 2019-06-08 LAB — HEPATIC FUNCTION PANEL
ALT: 14 U/L (ref 0–35)
AST: 10 U/L (ref 0–37)
Albumin: 3.8 g/dL (ref 3.5–5.2)
Alkaline Phosphatase: 65 U/L (ref 39–117)
Bilirubin, Direct: 0.1 mg/dL (ref 0.0–0.3)
Total Bilirubin: 0.5 mg/dL (ref 0.2–1.2)
Total Protein: 6.8 g/dL (ref 6.0–8.3)

## 2019-06-08 LAB — CBC WITH DIFFERENTIAL/PLATELET
Basophils Absolute: 0.1 10*3/uL (ref 0.0–0.1)
Basophils Relative: 0.5 % (ref 0.0–3.0)
Eosinophils Absolute: 0.2 10*3/uL (ref 0.0–0.7)
Eosinophils Relative: 1.4 % (ref 0.0–5.0)
HCT: 38.3 % (ref 36.0–46.0)
Hemoglobin: 12.9 g/dL (ref 12.0–15.0)
Lymphocytes Relative: 26.2 % (ref 12.0–46.0)
Lymphs Abs: 3.1 10*3/uL (ref 0.7–4.0)
MCHC: 33.6 g/dL (ref 30.0–36.0)
MCV: 91.7 fl (ref 78.0–100.0)
Monocytes Absolute: 0.9 10*3/uL (ref 0.1–1.0)
Monocytes Relative: 7.9 % (ref 3.0–12.0)
Neutro Abs: 7.6 10*3/uL (ref 1.4–7.7)
Neutrophils Relative %: 64 % (ref 43.0–77.0)
Platelets: 278 10*3/uL (ref 150.0–400.0)
RBC: 4.18 Mil/uL (ref 3.87–5.11)
RDW: 13.1 % (ref 11.5–15.5)
WBC: 11.9 10*3/uL — ABNORMAL HIGH (ref 4.0–10.5)

## 2019-06-08 LAB — BASIC METABOLIC PANEL
BUN: 12 mg/dL (ref 6–23)
CO2: 28 mEq/L (ref 19–32)
Calcium: 9.7 mg/dL (ref 8.4–10.5)
Chloride: 100 mEq/L (ref 96–112)
Creatinine, Ser: 0.76 mg/dL (ref 0.40–1.20)
GFR: 82.38 mL/min (ref >60.00)
Glucose, Bld: 99 mg/dL (ref 70–99)
Potassium: 3.9 mEq/L (ref 3.5–5.1)
Sodium: 136 mEq/L (ref 135–145)

## 2019-06-08 LAB — TSH: TSH: 1.11 u[IU]/mL (ref 0.35–4.50)

## 2019-06-18 ENCOUNTER — Ambulatory Visit: Payer: 59

## 2019-07-08 ENCOUNTER — Telehealth: Payer: Self-pay | Admitting: Family Medicine

## 2019-07-08 NOTE — Telephone Encounter (Signed)
Please advise 

## 2019-07-08 NOTE — Telephone Encounter (Signed)
Ok to transfer. 

## 2019-07-08 NOTE — Telephone Encounter (Signed)
Pt is requesting to transfer FROM: [facility] Pt is requesting to transfer TO: [facility]  Reason for requested transfer: Patient's family is established at the [facility] office, requests to be at the same place for her healthcare

## 2019-07-09 NOTE — Telephone Encounter (Signed)
Okay to transfer

## 2019-07-09 NOTE — Telephone Encounter (Signed)
Please advise if Ok to transfer

## 2019-08-25 ENCOUNTER — Encounter: Payer: Self-pay | Admitting: Family Medicine

## 2019-08-25 ENCOUNTER — Other Ambulatory Visit: Payer: Self-pay

## 2019-08-25 ENCOUNTER — Ambulatory Visit (INDEPENDENT_AMBULATORY_CARE_PROVIDER_SITE_OTHER): Payer: 59 | Admitting: Family Medicine

## 2019-08-25 VITALS — BP 125/85 | HR 77 | Temp 98.5°F | Resp 16 | Ht 65.0 in | Wt 223.5 lb

## 2019-08-25 DIAGNOSIS — F419 Anxiety disorder, unspecified: Secondary | ICD-10-CM

## 2019-08-25 DIAGNOSIS — D72823 Leukemoid reaction: Secondary | ICD-10-CM

## 2019-08-25 DIAGNOSIS — E782 Mixed hyperlipidemia: Secondary | ICD-10-CM

## 2019-08-25 DIAGNOSIS — D72829 Elevated white blood cell count, unspecified: Secondary | ICD-10-CM | POA: Insufficient documentation

## 2019-08-25 DIAGNOSIS — Z23 Encounter for immunization: Secondary | ICD-10-CM

## 2019-08-25 DIAGNOSIS — I10 Essential (primary) hypertension: Secondary | ICD-10-CM | POA: Diagnosis not present

## 2019-08-25 DIAGNOSIS — H8110 Benign paroxysmal vertigo, unspecified ear: Secondary | ICD-10-CM

## 2019-08-25 MED ORDER — AMLODIPINE BESYLATE 5 MG PO TABS: 5.0000 mg | ORAL_TABLET | Freq: Every day | ORAL | 1 refills | Status: DC

## 2019-08-25 MED ORDER — SERTRALINE HCL 25 MG PO TABS: 25.0000 mg | ORAL_TABLET | Freq: Every day | ORAL | 1 refills | Status: DC

## 2019-08-25 NOTE — Progress Notes (Signed)
Patient ID: Erika Weaver, female  DOB: 1975/11/07, 43 y.o.   MRN: [id number] Patient Care Team    Relationship Specialty Notifications Start End  Ma Hillock, DO PCP - General Family Medicine  07/15/19   Molli Posey, MD Consulting Physician Obstetrics and Gynecology  08/30/16   Volanda Napoleon, MD Consulting Physician Oncology  08/25/19   Josephina Gip, MD Referring Physician Internal Medicine  08/25/19   Lorretta Harp, MD Consulting Physician Cardiology  08/25/19   Ladene Artist, MD Consulting Physician Gastroenterology  08/25/19     Chief Complaint  Patient presents with   transfer of care    old pcp Dr. Birdie Riddle. patient states that she is doing fine.    Hypertension    needs a refill. took medication today   Anxiety    doesnt need a refill on med. took medication today    Subjective:  Erika Weaver is a 43 y.o.  female present for TOC/CMC. All past medical history, surgical history, allergies, family history, immunizations, medications and social history were updated in the electronic medical record today. All recent labs, ED visits and hospitalizations within the last year were reviewed.  Essential hypertension/Hyperlipidemia, mixed/Morbid obesity ([place]) Pt reports compliance with amlodipine 5 mg daily. Blood pressures ranges at home normal. Patient denies chest pain, shortness of breath or lower extremity edema. Pt does not daily baby ASA. Pt is not prescribed statin.  Anxiety/persistent postural perceptional vertigo Patient reports she was having dizziness and was getting frustrated secondary to diagnosis unable to be identified.  She reports she still to neurologist, cardiologist and then went on to a neurologist in [place] system.  She reports they diagnosed her w/ PPPV and started her on low-dose Zoloft.  Since that time she has had a great response to the medication with little side effects.    Depression screen [facility] 2/9 08/25/2019 06/05/2019  07/31/2018 03/13/2018 09/05/2017  Decreased Interest 1 0 0 0 0  Down, Depressed, Hopeless 0 0 0 0 0  PHQ - 2 Score 1 0 0 0 0  Altered sleeping - 0 0 0 0  Tired, decreased energy - 0 0 0 0  Change in appetite - 0 0 0 0  Feeling bad or failure about yourself  - 0 0 0 0  Trouble concentrating - 0 0 0 0  Moving slowly or fidgety/restless - 0 0 0 0  Suicidal thoughts - 0 0 0 0  PHQ-9 Score - 0 0 0 0  Difficult doing work/chores - Not difficult at all Not difficult at all Not difficult at all Not difficult at all   GAD 7 : Generalized Anxiety Score 08/25/2019  Nervous, Anxious, on Edge 0  Control/stop worrying 0  Worry too much - different things 0  Trouble relaxing 0  Restless 0  Easily annoyed or irritable 0  Afraid - awful might happen 0  Total GAD 7 Score 0  Anxiety Difficulty Not difficult at all     Fall Risk  06/05/2019 07/31/2018 09/05/2017 08/30/2016  Falls in the past year? 0 No No No  Number falls in past yr: 0 - - -  Injury with Fall? 0 - - -   Immunization History  Administered Date(s) Administered   Influenza Split 07/22/2009   Influenza Whole 07/23/2011   Influenza,inj,Quad PF,6+ Mos 08/18/2013, 12/09/2014, 09/22/2015, 08/30/2016, 09/05/2017, 07/31/2018, 08/25/2019   Influenza,inj,quad, With Preservative 08/22/2017   Influenza-Unspecified 07/27/2011   Tdap 12/16/2009, 08/18/2013  No exam data present  Past Medical History:  Diagnosis Date   Allergic state 11/22/2011   Anxiety 11/22/2011   Back pain 01/31/2013   Bartholin cyst 03/01/2013   BPV (benign positional vertigo) 08/23/2013   Chicken pox as a child   Headache 11/22/2011   Overview:  Headache  ICD-10 cut over     Hypertension    Kidney stone 12/21/2012   Other and unspecified hyperlipidemia 03/07/2014   Plantar fasciitis 01/11/2012   Rectal vaginal fistula 01/31/2013   Retrosternal chest pain 11/08/2015   Sinusitis 11/22/2011   Sleep apnea 04/03/2012   setting 2   Visual changes  11/22/2011   Allergies  Allergen Reactions   Topamax [Topiramate]     dizziness   Zonegran [Zonisamide]     dizziness   Past Surgical History:  Procedure Laterality Date   GUM SURGERY     PERINEAL BODY REPAIR     4 th degree tear with fistula between vagina and retum requiring 3 surgeries for correction   rectal fistula repair     x 2   WISDOM TOOTH EXTRACTION     Family History  Problem Relation Age of Onset   Diabetes Mother        type 2   Hypertension Mother    Hyperlipidemia Mother    Hypertension Father    Hyperlipidemia Father    Aneurysm Father        abdominal aorta   Cancer Maternal Grandmother 82       colon, ovarian   Other Maternal Grandfather 89       brain hemorrhage   Stroke Maternal Grandfather    Aneurysm Maternal Grandfather    Heart attack Paternal Grandmother    Heart disease Paternal Grandmother        MI   Alzheimer's disease Paternal Grandfather    Dementia Paternal Grandfather    Heart attack Paternal Aunt    Heart disease Paternal Aunt         AAA rupture   Other Paternal Uncle        cerebreal brain hemorrhage   Stroke Paternal Uncle    Aneurysm Paternal Uncle    Asthma Daughter    Other Daughter        peanut allergy   Heart disease Maternal Aunt        s/p cabg. brain aneurysm s/p coiling   Aneurysm Maternal Aunt        coil behind eye   Social History   Social History Narrative   Marital status/children/pets: 1 child.  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   Safety:      -smoke alarm in the home:Yes     - wears seatbelt: Yes     - Feels safe in their relationships: Yes    Allergies as of 08/25/2019      Reactions   Topamax [topiramate]    dizziness   Zonegran [zonisamide]    dizziness      Medication List       Accurate as of August 25, 2019  4:01 PM. If you have any questions, ask your nurse or doctor.        amLODipine 5 MG tablet Commonly  known as: NORVASC Take 1 tablet (5 mg total) by mouth daily.   Multi-Vitamins Tabs Take by mouth.   sertraline 25 MG tablet Commonly known as: ZOLOFT Take 1 tablet (25 mg total) by mouth daily.  All past medical history, surgical history, allergies, family history, immunizations andmedications were updated in the EMR today and reviewed under the history and medication portions of their EMR.    Recent Results (from the past 2160 hour(s))  CBC with Differential/Platelet     Status: Abnormal   Collection Time: 06/08/19  1:12 PM  Result Value Ref Range   WBC 11.9 (H) 4.0 - 10.5 K/uL   RBC 4.18 3.87 - 5.11 Mil/uL   Hemoglobin 12.9 12.0 - 15.0 g/dL   HCT 38.3 36.0 - 46.0 %   MCV 91.7 78.0 - 100.0 fl   MCHC 33.6 30.0 - 36.0 g/dL   RDW 13.1 11.5 - 15.5 %   Platelets 278.0 150.0 - 400.0 K/uL   Neutrophils Relative % 64.0 43.0 - 77.0 %   Lymphocytes Relative 26.2 12.0 - 46.0 %   Monocytes Relative 7.9 3.0 - 12.0 %   Eosinophils Relative 1.4 0.0 - 5.0 %   Basophils Relative 0.5 0.0 - 3.0 %   Neutro Abs 7.6 1.4 - 7.7 K/uL   Lymphs Abs 3.1 0.7 - 4.0 K/uL   Monocytes Absolute 0.9 0.1 - 1.0 K/uL   Eosinophils Absolute 0.2 0.0 - 0.7 K/uL   Basophils Absolute 0.1 0.0 - 0.1 K/uL  Hepatic function panel     Status: None   Collection Time: 06/08/19  1:12 PM  Result Value Ref Range   Total Bilirubin 0.5 0.2 - 1.2 mg/dL   Bilirubin, Direct 0.1 0.0 - 0.3 mg/dL   Alkaline Phosphatase 65 39 - 117 U/L   AST 10 0 - 37 U/L   ALT 14 0 - 35 U/L   Total Protein 6.8 6.0 - 8.3 g/dL   Albumin 3.8 3.5 - 5.2 g/dL  TSH     Status: None   Collection Time: 06/08/19  1:12 PM  Result Value Ref Range   TSH 1.11 0.35 - 4.50 uIU/mL  Basic metabolic panel     Status: None   Collection Time: 06/08/19  1:12 PM  Result Value Ref Range   Sodium 136 135 - 145 mEq/L   Potassium 3.9 3.5 - 5.1 mEq/L   Chloride 100 96 - 112 mEq/L   CO2 28 19 - 32 mEq/L   Glucose, Bld 99 70 - 99 mg/dL   BUN 12 6 - 23 mg/dL     Creatinine, Ser 0.76 0.40 - 1.20 mg/dL   Calcium 9.7 8.4 - 10.5 mg/dL   GFR 82.38 >60.00 mL/min  Lipid panel     Status: Abnormal   Collection Time: 06/08/19  1:12 PM  Result Value Ref Range   Cholesterol 185 0 - 200 mg/dL    Comment: ATP III Classification       Desirable:  < 200 mg/dL               Borderline High:  200 - 239 mg/dL          High:  > = 240 mg/dL   Triglycerides 233.0 (H) 0.0 - 149.0 mg/dL    Comment: Normal:  <150 mg/dLBorderline High:  150 - 199 mg/dL   HDL 42.60 >39.00 mg/dL   VLDL 46.6 (H) 0.0 - 40.0 mg/dL   Total CHOL/HDL Ratio 4     Comment:                Men          Women1/2 Average Risk     3.4  3.3Average Risk          5.0          4.42X Average Risk          9.6          7.13X Average Risk          15.0          11.0                       NonHDL 142.49     Comment: NOTE:  Non-HDL goal should be 30 mg/dL higher than patient's LDL goal (i.e. LDL goal of < 70 mg/dL, would have non-HDL goal of < 100 mg/dL)  LDL cholesterol, direct     Status: None   Collection Time: 06/08/19  1:12 PM  Result Value Ref Range   Direct LDL 130.0 mg/dL    Comment: Optimal:  <100 mg/dLNear or Above Optimal:  100-129 mg/dLBorderline High:  130-159 mg/dLHigh:  160-189 mg/dLVery High:  >190 mg/dL    ROS: 14 pt review of systems performed and negative (unless mentioned in an HPI)  Objective: BP 125/85 (BP Location: Left Arm, Patient Position: Sitting, Cuff Size: Large)    Pulse 77    Temp 98.5 F (36.9 C) (Temporal)    Resp 16    Ht 5' 5"  (1.651 m)    Wt 223 lb 8 oz (101.4 kg)    LMP 08/09/2019 (Exact Date)    SpO2 98%    BMI 37.19 kg/m  Gen: Afebrile. No acute distress. Nontoxic in appearance, well-developed, well-nourished, pleasant, Caucasian female.  Obese. HENT: AT. [place].   No cough on exam, no hoarseness on exam. Eyes:Pupils Equal Round Reactive to light, Extraocular movements intact,  Conjunctiva without redness, discharge or icterus. Neck/lymp/endocrine: Supple, no  lymphadenopathy, no thyromegaly CV: RRR no murmur, no edema Chest: CTAB, no wheeze, rhonchi or crackles. Skin: Warm and well-perfused. Skin intact.  Mildly dry skin. Neuro/Msk: Normal gait. PERLA. EOMi. Alert. Oriented x3.   Psych: Normal affect, dress and demeanor. Normal speech. Normal thought content and judgment.   Assessment/plan: Erika Weaver is a 43 y.o. female present for [facility] Essential hypertension/Hyperlipidemia, mixed/ Morbid obesity ([place]) Stable. Continue amlodipine 5 mg daily.  Refills provided for her today. Low-sodium diet.  Discussed routine exercise, heart rate needed for calorie burning, cardiovascular workout should be around 140-150 to burn calories.  She should try to be in the cardiovascular zone approximately 150 minutes a week for weight loss. My calorie calculator and my fitness pal app were discussed with her today for her to used to identify how many calories she should be in taking a day, and then tracked them accordingly. Reviewed labs recently collected in August. Follow-up in 6 months  Leukemoid reaction Patient with chronic elevation in WBCs for the past 2 years.  Follows with Dr. Marin Olp hematology.  No identifiable causes.  Benign paroxysmal positional vertigo, unspecified laterality/PPPD/anxiety Patient has been evaluated by to local neurologist and [place] neurology.  PPD was the final diagnosis she has responded well to low-dose Zoloft 25 mg daily. Refills provided for her today. Follow-up in 6 months  Influenza vaccine administered today.  Return in about 5 months (around 02/08/2020) for CMC (30 min).   Note is dictated utilizing voice recognition software. Although note has been proof read prior to signing, occasional typographical errors still can be missed. If any questions arise, please do not hesitate to call for verification.  Electronically signed  by: Howard Pouch, DO [place]

## 2019-08-25 NOTE — Patient Instructions (Addendum)
Calorie calculator.net is the site I showed you today.  Heart rate should be up in the 140-160 range to burn calories>> 150 minutes a week.   My fitness pal app for phone.   I refilled your meds for you today.    It was a pleasure meeting you today.   Follow up in 6 months on chronic conditions.

## 2019-09-25 ENCOUNTER — Telehealth: Payer: Self-pay

## 2019-09-25 NOTE — Telephone Encounter (Signed)
Pt states her lower lip is swollen, cracking, bleeding, and very painful x2 days. She has tried OTC medications and ice. She said there are raw places on the inside of her bottom lip. It does bleed but not constantly. Pt was nervous on the phone and very concerned. Pt was advised to go to urgent care and get evaluated due to swelling. She agreed and was going now. Pt was advised if she became SOB, hard to swallow she needed to call 911, she understood.

## 2019-11-23 LAB — CBC AND DIFFERENTIAL: Hemoglobin: 14.3 (ref 12.0–16.0)

## 2019-11-25 ENCOUNTER — Other Ambulatory Visit: Payer: Self-pay

## 2019-11-25 ENCOUNTER — Encounter: Payer: Self-pay | Admitting: Family Medicine

## 2019-12-03 ENCOUNTER — Encounter: Payer: 59 | Admitting: Family Medicine

## 2020-02-20 ENCOUNTER — Other Ambulatory Visit: Payer: Self-pay | Admitting: Family Medicine

## 2020-03-04 ENCOUNTER — Other Ambulatory Visit: Payer: Self-pay | Admitting: Family Medicine

## 2020-03-07 ENCOUNTER — Other Ambulatory Visit: Payer: Self-pay

## 2020-03-07 NOTE — Telephone Encounter (Signed)
Pt was called, 6 month [place] appt made, 30 day supply sent to pharmacy

## 2020-03-25 ENCOUNTER — Encounter: Payer: Self-pay | Admitting: Family Medicine

## 2020-03-25 ENCOUNTER — Other Ambulatory Visit: Payer: Self-pay

## 2020-03-25 ENCOUNTER — Ambulatory Visit: Payer: 59 | Admitting: Family Medicine

## 2020-03-25 VITALS — BP 121/73 | HR 74 | Temp 97.6°F | Resp 18 | Ht 65.0 in | Wt 224.2 lb

## 2020-03-25 DIAGNOSIS — I10 Essential (primary) hypertension: Secondary | ICD-10-CM | POA: Diagnosis not present

## 2020-03-25 DIAGNOSIS — H8110 Benign paroxysmal vertigo, unspecified ear: Secondary | ICD-10-CM

## 2020-03-25 DIAGNOSIS — F419 Anxiety disorder, unspecified: Secondary | ICD-10-CM | POA: Diagnosis not present

## 2020-03-25 DIAGNOSIS — E782 Mixed hyperlipidemia: Secondary | ICD-10-CM | POA: Diagnosis not present

## 2020-03-25 MED ORDER — SERTRALINE HCL 25 MG PO TABS: 25.0000 mg | ORAL_TABLET | Freq: Every day | ORAL | 1 refills | Status: DC

## 2020-03-25 MED ORDER — AMLODIPINE BESYLATE 5 MG PO TABS: ORAL_TABLET | ORAL | 1 refills | Status: DC

## 2020-03-25 NOTE — Patient Instructions (Addendum)
Great to see you today! I have refilled your medications.   I hope you have a great Summer.   Next appt near Thanksgiving. This can be scheduled as your physical and we will also address your chronic conditions if you desire.

## 2020-03-25 NOTE — Progress Notes (Signed)
Patient ID: Erika Weaver, female  DOB: June 30, 1976, 44 y.o.   MRN: [id number] Patient Care Team    Relationship Specialty Notifications Start End  Ma Hillock, DO PCP - General Family Medicine  07/15/19   Molli Posey, MD Consulting Physician Obstetrics and Gynecology  08/30/16   Volanda Napoleon, MD Consulting Physician Oncology  08/25/19   Josephina Gip, MD Referring Physician Internal Medicine  08/25/19   Lorretta Harp, MD Consulting Physician Cardiology  08/25/19   Ladene Artist, MD Consulting Physician Gastroenterology  08/25/19     Chief Complaint  Patient presents with  . Anxiety    Needs refills on meds   . Hypertension    Subjective: Erika Weaver is a 44 y.o.  female present for [facility] Essential hypertension/Hyperlipidemia, mixed/Morbid obesity ([place]) Pt reports compliance with amlodipine 5 mg daily. Patient denies chest pain, shortness of breath, dizziness or lower extremity edema.   Pt does not daily baby ASA. Pt is not prescribed statin.  Anxiety/persistent postural perceptional vertigo Patient reports she was having dizziness and was getting frustrated secondary to diagnosis unable to be identified.  She reports she still to neurologist, cardiologist and then went on to a neurologist in [place] system.  She reports they diagnosed her w/ PPPV and started her on low-dose Zoloft.  Since that time she has had a great response to the medication with little side effects. Today she reports she is doing well. Medications are working good- both anxiey and PPPV controlled.    Depression screen [facility] 2/9 08/25/2019 06/05/2019 07/31/2018 03/13/2018 09/05/2017  Decreased Interest 1 0 0 0 0  Down, Depressed, Hopeless 0 0 0 0 0  PHQ - 2 Score 1 0 0 0 0  Altered sleeping - 0 0 0 0  Tired, decreased energy - 0 0 0 0  Change in appetite - 0 0 0 0  Feeling bad or failure about yourself  - 0 0 0 0  Trouble concentrating - 0 0 0 0  Moving slowly or fidgety/restless - 0 0 0  0  Suicidal thoughts - 0 0 0 0  PHQ-9 Score - 0 0 0 0  Difficult doing work/chores - Not difficult at all Not difficult at all Not difficult at all Not difficult at all   GAD 7 : Generalized Anxiety Score 03/25/2020 08/25/2019  Nervous, Anxious, on Edge 0 0  Control/stop worrying 0 0  Worry too much - different things 0 0  Trouble relaxing 0 0  Restless 0 0  Easily annoyed or irritable 0 0  Afraid - awful might happen 0 0  Total GAD 7 Score 0 0  Anxiety Difficulty Not difficult at all Not difficult at all     Fall Risk  06/05/2019 07/31/2018 09/05/2017 08/30/2016  Falls in the past year? 0 No No No  Number falls in past yr: 0 - - -  Injury with Fall? 0 - - -   Immunization History  Administered Date(s) Administered  . Influenza Split 07/22/2009  . Influenza Whole 07/23/2011  . Influenza,inj,Quad PF,6+ Mos 08/18/2013, 12/09/2014, 09/22/2015, 08/30/2016, 09/05/2017, 07/31/2018, 08/25/2019  . Influenza,inj,quad, With Preservative 08/22/2017  . Influenza-Unspecified 07/27/2011  . Tdap 12/16/2009, 08/18/2013    No exam data present  Past Medical History:  Diagnosis Date  . Allergic state 11/22/2011  . Anxiety 11/22/2011  . Back pain 01/31/2013  . Bartholin cyst 03/01/2013  . BPV (benign positional vertigo) 08/23/2013  . Chicken pox as a child  .  Headache 11/22/2011   Overview:  Headache  ICD-10 cut over    . Hypertension   . Kidney stone 12/21/2012  . Other and unspecified hyperlipidemia 03/07/2014  . Plantar fasciitis 01/11/2012  . 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 Retrosternal chest pain 11/08/2015  . Sinusitis 11/22/2011  . Sleep apnea 04/03/2012   setting 2  . Visual changes 11/22/2011   Allergies  Allergen Reactions  . Topamax [Topiramate]     dizziness  . Zonegran [Zonisamide]     dizziness   Past Surgical History:  Procedure Laterality Date  . GUM SURGERY    . PERINEAL BODY REPAIR     4 th degree tear with fistula between vagina and retum requiring 3 surgeries for  correction  . rectal fistula repair     x 2  . WISDOM TOOTH EXTRACTION     Family History  Problem Relation Age of Onset  . Diabetes Mother        type 2  . Hypertension Mother   . Hyperlipidemia Mother   . Hypertension Father   . Hyperlipidemia Father   . Aneurysm Father        abdominal aorta  . Cancer Maternal Grandmother 82       colon, ovarian  . Other Maternal Grandfather 41       brain hemorrhage  . Stroke Maternal Grandfather   . Aneurysm Maternal Grandfather   . Heart attack Paternal Grandmother   . Heart disease Paternal Grandmother        MI  . Alzheimer's disease Paternal Grandfather   . Dementia Paternal Grandfather   . Heart attack Paternal Aunt   . Heart disease Paternal Aunt         AAA rupture  . Other Paternal Uncle        cerebreal brain hemorrhage  . Stroke Paternal Uncle   . Aneurysm Paternal Uncle   . Asthma Daughter   . Other Daughter        peanut allergy  . Heart disease Maternal Aunt        s/p cabg. brain aneurysm s/p coiling  . Aneurysm Maternal Aunt        coil behind eye   Social History   Social History Narrative   Marital status/children/pets: 1 child.  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   Safety:      -smoke alarm in the home:Yes     - wears seatbelt: Yes     - Feels safe in their relationships: Yes    Allergies as of 03/25/2020      Reactions   Topamax [topiramate]    dizziness   Zonegran [zonisamide]    dizziness      Medication List       Accurate as of March 25, 2020  9:49 AM. If you have any questions, ask your nurse or doctor.        amLODipine 5 MG tablet Commonly known as: NORVASC TAKE 1 TABLET(5 MG) BY MOUTH DAILY   Multi-Vitamins Tabs Take by mouth.   sertraline 25 MG tablet Commonly known as: ZOLOFT Take 1 tablet (25 mg total) by mouth daily.       All past medical history, surgical history, allergies, family history, immunizations andmedications were  updated in the EMR today and reviewed under the history and medication portions of their EMR.    No results found for this or any previous visit (from the past 2160 hour(s)).  ROS:  14 pt review of systems performed and negative (unless mentioned in an HPI)  Objective: BP 121/73 (BP Location: Right Arm, Patient Position: Sitting, Cuff Size: Large)   Pulse 74   Temp 97.6 F (36.4 C) (Temporal)   Resp 18   Ht 5' 5"  (1.651 m)   Wt 224 lb 4 oz (101.7 kg)   LMP 03/15/2020 (Exact Date)   SpO2 99%   BMI 37.32 kg/m  Gen: Afebrile. No acute distress. Pleasant female.  HENT: AT. [place].  Eyes:Pupils Equal Round Reactive to light, Extraocular movements intact,  Conjunctiva without redness, discharge or icterus. Neck/lymp/endocrine: Supple,no lymphadenopathy, no thyromegaly CV: RRR no murmur, no edema, +2/4 P posterior tibialis pulses Chest: CTAB, no wheeze or crackles Abd: Soft. NTND. BS present. no Masses palpated.  Skin: no rashes, purpura or petechiae.  Neuro:  Normal gait. PERLA. EOMi. Alert. Oriented x3  Psych: Normal affect, dress and demeanor. Normal speech. Normal thought content and judgment.  Assessment/plan: Erika Weaver is a 44 y.o. female present for  Essential hypertension/Hyperlipidemia, mixed/ Morbid obesity ([place]) Stable.  Continue amlodipine 5 mg daily Low-sodium diet.   Follow-up in 6 months  Leukemoid reaction Patient with chronic elevation in WBCs for the past 2 years.  Follows with Dr. Marin Olp hematology.  No identifiable causes.  Benign paroxysmal positional vertigo, unspecified laterality/PPPD/anxiety stable Patient has been evaluated by to local neurologist and [place] neurology.  PPD was the final diagnosis she has responded well to low-dose Zoloft 25 mg daily. Continue zoloft Follow-up in 6 months  Return in about 6 months (around 09/12/2020) for CPE (30 min), CMC (30 min).   Note is dictated utilizing voice recognition software. Although note has been  proof read prior to signing, occasional typographical errors still can be missed. If any questions arise, please do not hesitate to call for verification.  Electronically signed by: Howard Pouch, DO [place]

## 2020-04-20 ENCOUNTER — Other Ambulatory Visit: Payer: Self-pay | Admitting: Family Medicine

## 2020-04-20 DIAGNOSIS — Z1231 Encounter for screening mammogram for malignant neoplasm of breast: Secondary | ICD-10-CM

## 2020-05-06 ENCOUNTER — Other Ambulatory Visit: Payer: Self-pay

## 2020-05-06 ENCOUNTER — Encounter: Payer: Self-pay | Admitting: Family

## 2020-05-06 ENCOUNTER — Inpatient Hospital Stay: Payer: 59 | Attending: Hematology & Oncology | Admitting: Family

## 2020-05-06 ENCOUNTER — Inpatient Hospital Stay: Payer: 59

## 2020-05-06 VITALS — BP 129/72 | HR 76 | Temp 98.4°F | Resp 18 | Wt 221.0 lb

## 2020-05-06 DIAGNOSIS — D72823 Leukemoid reaction: Secondary | ICD-10-CM | POA: Diagnosis not present

## 2020-05-06 DIAGNOSIS — D72829 Elevated white blood cell count, unspecified: Secondary | ICD-10-CM | POA: Diagnosis not present

## 2020-05-06 LAB — CBC WITH DIFFERENTIAL (CANCER CENTER ONLY)
Abs Immature Granulocytes: 0.05 10*3/uL (ref 0.00–0.07)
Basophils Absolute: 0.1 10*3/uL (ref 0.0–0.1)
Basophils Relative: 1 %
Eosinophils Absolute: 0.2 10*3/uL (ref 0.0–0.5)
Eosinophils Relative: 2 %
HCT: 37.6 % (ref 36.0–46.0)
Hemoglobin: 12.7 g/dL (ref 12.0–15.0)
Immature Granulocytes: 1 %
Lymphocytes Relative: 23 %
Lymphs Abs: 2.4 10*3/uL (ref 0.7–4.0)
MCH: 31.3 pg (ref 26.0–34.0)
MCHC: 33.8 g/dL (ref 30.0–36.0)
MCV: 92.6 fL (ref 80.0–100.0)
Monocytes Absolute: 0.8 10*3/uL (ref 0.1–1.0)
Monocytes Relative: 8 %
Neutro Abs: 6.8 10*3/uL (ref 1.7–7.7)
Neutrophils Relative %: 65 %
Platelet Count: 279 10*3/uL (ref 150–400)
RBC: 4.06 MIL/uL (ref 3.87–5.11)
RDW: 12.5 % (ref 11.5–15.5)
WBC Count: 10.3 10*3/uL (ref 4.0–10.5)
nRBC: 0 % (ref 0.0–0.2)

## 2020-05-06 LAB — SAVE SMEAR(SSMR), FOR PROVIDER SLIDE REVIEW

## 2020-05-06 NOTE — Progress Notes (Signed)
Hematology and Oncology Follow Up Visit  Erika Weaver [id number] 03/07/1976 44 y.o. 05/06/2020   Principle Diagnosis:  Leukocytosis - leukemoid reaction  Current Therapy:        Observation   Interim History:  Erika Weaver is here today for her annual follow-up. She is doing well but is trying to pass a kidney stone. She is not in any pain at this time. She has been drinking lots of fluids and her urologist has her taking Flomax to help pass it. She goes back to see them next week. WBC count is 10.3, Hgb 12.7 and platelets 279.  She has had no issue with infection. No fever, chills, n/v, cough, rash, dizziness, SOB, chest pain, palpitations, abdominal pain or changes in bowel or bladder habits.  No swelling, tenderness, numbness or tingling in her extremities.  No falls or syncope.  She is eating well and hydrating properly. Her weight is stable.   ECOG Performance Status: 0 - Asymptomatic  Medications:  Allergies as of 05/06/2020      Reactions   Topamax [topiramate]    dizziness   Zonegran [zonisamide]    dizziness      Medication List       Accurate as of May 06, 2020 10:16 AM. If you have any questions, ask your nurse or doctor.        amLODipine 5 MG tablet Commonly known as: NORVASC TAKE 1 TABLET(5 MG) BY MOUTH DAILY   Multi-Vitamins Tabs Take by mouth.   sertraline 25 MG tablet Commonly known as: ZOLOFT Take 1 tablet (25 mg total) by mouth daily.   tamsulosin 0.4 MG Caps capsule Commonly known as: FLOMAX Take 0.4 mg by mouth daily.       Allergies:  Allergies  Allergen Reactions  . Topamax [Topiramate]     dizziness  . Zonegran [Zonisamide]     dizziness    Past Medical History, Surgical history, Social history, and Family History were reviewed and updated.  Review of Systems: All other 10 point review of systems is negative.   Physical Exam:  weight is 221 lb (100.2 kg). Her oral temperature is 98.4 F (36.9 C). Her blood pressure is  129/72 and her pulse is 76. Her respiration is 18 and oxygen saturation is 99%.   Wt Readings from Last 3 Encounters:  05/06/20 221 lb (100.2 kg)  03/25/20 224 lb 4 oz (101.7 kg)  08/25/19 223 lb 8 oz (101.4 kg)    Ocular: Sclerae unicteric, pupils equal, round and reactive to light Ear-nose-throat: Oropharynx clear, dentition fair Lymphatic: No cervical or supraclavicular adenopathy Lungs no rales or rhonchi, good excursion bilaterally Heart regular rate and rhythm, no murmur appreciated Abd soft, nontender, positive bowel sounds, No liver or spleen tip palpated on exam, no fluid wave  MSK no focal spinal tenderness, no joint edema Neuro: non-focal, well-oriented, appropriate affect Breasts: Deffered  Lab Results  Component Value Date   WBC 10.3 05/06/2020   HGB 12.7 05/06/2020   HCT 37.6 05/06/2020   MCV 92.6 05/06/2020   PLT 279 05/06/2020   Lab Results  Component Value Date   FERRITIN 45.6 12/22/2015   Lab Results  Component Value Date   RBC 4.06 05/06/2020   No results found for: KPAFRELGTCHN, LAMBDASER, KAPLAMBRATIO No results found for: IGGSERUM, IGA, IGMSERUM No results found for: Ronnald Ramp, A1GS, A2GS, Violet Baldy, MSPIKE, SPEI   Chemistry      Component Value Date/Time   NA 136 06/08/2019 1312  K 3.9 06/08/2019 1312   CL 100 06/08/2019 1312   CO2 28 06/08/2019 1312   BUN 12 06/08/2019 1312   CREATININE 0.76 06/08/2019 1312   CREATININE 0.84 05/07/2019 0956   CREATININE 0.72 05/27/2014 0944      Component Value Date/Time   CALCIUM 9.7 06/08/2019 1312   ALKPHOS 65 06/08/2019 1312   AST 10 06/08/2019 1312   AST 12 (L) 05/07/2019 0956   ALT 14 06/08/2019 1312   ALT 13 05/07/2019 0956   BILITOT 0.5 06/08/2019 1312   BILITOT 0.3 05/07/2019 0956       Impression and Plan: Erika Weaver is a very pleasant 44 yo caucasian female with intermittent leukocytosis.  She continues to do well and remains asymptomatic. Her WBC count and  differential are unremarkable.  We will plan to see her again in another year.  She can contact our office with any questions or concerns. We can certainly see her sooner if needed.   Laverna Peace, NP 7/16/202110:16 AM

## 2020-05-09 ENCOUNTER — Telehealth: Payer: Self-pay | Admitting: Family

## 2020-05-09 NOTE — Telephone Encounter (Signed)
Appointments scheduled calendar printed & mailed per 7/16 los

## 2020-06-02 ENCOUNTER — Other Ambulatory Visit: Payer: Self-pay

## 2020-06-02 ENCOUNTER — Ambulatory Visit
Admission: RE | Admit: 2020-06-02 | Discharge: 2020-06-02 | Disposition: A | Discharge status: Home / Self Care | Payer: 59 | Source: Ambulatory Visit | Attending: Family Medicine | Admitting: Family Medicine

## 2020-06-02 DIAGNOSIS — Z1231 Encounter for screening mammogram for malignant neoplasm of breast: Secondary | ICD-10-CM

## 2020-06-02 LAB — HM MAMMOGRAPHY

## 2020-09-05 ENCOUNTER — Other Ambulatory Visit: Payer: Self-pay

## 2020-09-08 ENCOUNTER — Encounter: Payer: Self-pay | Admitting: Family Medicine

## 2020-09-08 ENCOUNTER — Other Ambulatory Visit: Payer: Self-pay

## 2020-09-08 ENCOUNTER — Ambulatory Visit (INDEPENDENT_AMBULATORY_CARE_PROVIDER_SITE_OTHER): Payer: 59 | Admitting: Family Medicine

## 2020-09-08 VITALS — BP 129/83 | HR 75 | Temp 97.7°F | Ht 65.25 in | Wt 223.0 lb

## 2020-09-08 DIAGNOSIS — Z23 Encounter for immunization: Secondary | ICD-10-CM | POA: Diagnosis not present

## 2020-09-08 DIAGNOSIS — Z1159 Encounter for screening for other viral diseases: Secondary | ICD-10-CM

## 2020-09-08 DIAGNOSIS — E782 Mixed hyperlipidemia: Secondary | ICD-10-CM

## 2020-09-08 DIAGNOSIS — Z1211 Encounter for screening for malignant neoplasm of colon: Secondary | ICD-10-CM

## 2020-09-08 DIAGNOSIS — Z0000 Encounter for general adult medical examination without abnormal findings: Secondary | ICD-10-CM | POA: Diagnosis not present

## 2020-09-08 DIAGNOSIS — H8110 Benign paroxysmal vertigo, unspecified ear: Secondary | ICD-10-CM

## 2020-09-08 DIAGNOSIS — D72823 Leukemoid reaction: Secondary | ICD-10-CM

## 2020-09-08 DIAGNOSIS — I10 Essential (primary) hypertension: Secondary | ICD-10-CM

## 2020-09-08 DIAGNOSIS — F419 Anxiety disorder, unspecified: Secondary | ICD-10-CM

## 2020-09-08 DIAGNOSIS — Z1231 Encounter for screening mammogram for malignant neoplasm of breast: Secondary | ICD-10-CM

## 2020-09-08 DIAGNOSIS — Z131 Encounter for screening for diabetes mellitus: Secondary | ICD-10-CM | POA: Diagnosis not present

## 2020-09-08 DIAGNOSIS — E559 Vitamin D deficiency, unspecified: Secondary | ICD-10-CM | POA: Diagnosis not present

## 2020-09-08 LAB — VITAMIN D 25 HYDROXY (VIT D DEFICIENCY, FRACTURES): VITD: 23.97 ng/mL — ABNORMAL LOW (ref 30.00–100.00)

## 2020-09-08 LAB — COMPREHENSIVE METABOLIC PANEL
ALT: 27 U/L (ref 0–35)
AST: 21 U/L (ref 0–37)
Albumin: 3.9 g/dL (ref 3.5–5.2)
Alkaline Phosphatase: 70 U/L (ref 39–117)
BUN: 8 mg/dL (ref 6–23)
CO2: 28 mEq/L (ref 19–32)
Calcium: 9.3 mg/dL (ref 8.4–10.5)
Chloride: 102 mEq/L (ref 96–112)
Creatinine, Ser: 0.69 mg/dL (ref 0.40–1.20)
GFR: 104.36 mL/min (ref >60.00)
Glucose, Bld: 90 mg/dL (ref 70–99)
Potassium: 4.2 mEq/L (ref 3.5–5.1)
Sodium: 136 mEq/L (ref 135–145)
Total Bilirubin: 0.4 mg/dL (ref 0.2–1.2)
Total Protein: 7.2 g/dL (ref 6.0–8.3)

## 2020-09-08 LAB — LIPID PANEL
Cholesterol: 198 mg/dL (ref 0–200)
HDL: 47.9 mg/dL (ref >39.00)
LDL Cholesterol: 127 mg/dL — ABNORMAL HIGH (ref 0–99)
NonHDL: 150.44
Total CHOL/HDL Ratio: 4
Triglycerides: 118 mg/dL (ref 0.0–149.0)
VLDL: 23.6 mg/dL (ref 0.0–40.0)

## 2020-09-08 LAB — TSH: TSH: 1.24 u[IU]/mL (ref 0.35–4.50)

## 2020-09-08 LAB — HEMOGLOBIN A1C: Hgb A1c MFr Bld: 5.7 % (ref 4.6–6.5)

## 2020-09-08 MED ORDER — SERTRALINE HCL 25 MG PO TABS: 25.0000 mg | ORAL_TABLET | Freq: Every day | ORAL | 1 refills | Status: DC

## 2020-09-08 MED ORDER — AMLODIPINE BESYLATE 5 MG PO TABS
ORAL_TABLET | ORAL | 1 refills | Status: DC
Start: 2020-09-08 — End: 2020-12-19

## 2020-09-08 NOTE — Progress Notes (Signed)
This visit occurred during the SARS-CoV-2 public health emergency.  Safety protocols were in place, including screening questions prior to the visit, additional usage of staff PPE, and extensive cleaning of exam room while observing appropriate contact time as indicated for disinfecting solutions.    Patient ID: Erika Weaver, female  DOB: 07-17-1976, 44 y.o.   MRN: [id number] Patient Care Team    Relationship Specialty Notifications Start End  Ma Hillock, DO PCP - General Family Medicine  07/15/19   Molli Posey, MD Consulting Physician Obstetrics and Gynecology  08/30/16   Volanda Napoleon, MD Consulting Physician Oncology  08/25/19   Josephina Gip, MD Referring Physician Internal Medicine  08/25/19   Lorretta Harp, MD Consulting Physician Cardiology  08/25/19   Ladene Artist, MD Consulting Physician Gastroenterology  08/25/19     Chief Complaint  Patient presents with  . Annual Exam    pt is fasting    Subjective:  Erika Weaver is a 44 y.o.  Female  present for CPE. All past medical history, surgical history, allergies, family history, immunizations, medications and social history were updated in the electronic medical record today. All recent labs, ED visits and hospitalizations within the last year were reviewed.  Health maintenance:  Colonoscopy: MGM colon cancer in her 14s. Pt elected cologuard screening> ordered.  Mammogram: completed:06/03/2020,[facility]> ordered for next year. Cervical cancer screening: last pap: 09/21/2018 with gyn. Immunizations: tdap 07/2013 UTD, Influenza administered today (encouraged yearly), covid series completed (she will call in with dates). Infectious disease screening: HIV completed, Hep C completed today DEXA:routine screen Assistive device: none Oxygen TKZ:SWFU Patient has a Dental home. Hospitalizations/ED visits: reviewed  Essential hypertension/Hyperlipidemia, mixed/Morbid obesity ([place]) Pt reports compliancewith  amlodipine 5 mg daily. Patient denies chest pain, shortness of breath, dizziness or lower extremity edema.    Pt does not daily baby ASA. Pt is not prescribed statin.  Anxiety/persistent postural perceptional vertigo Patient reports she was having dizziness and was getting frustrated secondary to diagnosis unable to be identified.  She reports she still to neurologist, cardiologist and then went on to a neurologist in [place] system.  She reports they diagnosed her w/ PPPV and started her on low-dose Zoloft.  Since that time she has had a great response to the medication with little side effects. Today she reports she is doing well with  Medications. They  are working good- both anxiey and PPPV controlled with zoloft.    Depression screen [facility] 2/9 09/08/2020 08/25/2019 06/05/2019 07/31/2018 03/13/2018  Decreased Interest 0 1 0 0 0  Down, Depressed, Hopeless 0 0 0 0 0  PHQ - 2 Score 0 1 0 0 0  Altered sleeping - - 0 0 0  Tired, decreased energy - - 0 0 0  Change in appetite - - 0 0 0  Feeling bad or failure about yourself  - - 0 0 0  Trouble concentrating - - 0 0 0  Moving slowly or fidgety/restless - - 0 0 0  Suicidal thoughts - - 0 0 0  PHQ-9 Score - - 0 0 0  Difficult doing work/chores - - Not difficult at all Not difficult at all Not difficult at all   GAD 7 : Generalized Anxiety Score 03/25/2020 08/25/2019  Nervous, Anxious, on Edge 0 0  Control/stop worrying 0 0  Worry too much - different things 0 0  Trouble relaxing 0 0  Restless 0 0  Easily annoyed or irritable 0 0  Afraid -  awful might happen 0 0  Total GAD 7 Score 0 0  Anxiety Difficulty Not difficult at all Not difficult at all   Immunization History  Administered Date(s) Administered  . Influenza Split 07/22/2009  . Influenza Whole 07/23/2011  . Influenza,inj,Quad PF,6+ Mos 08/18/2013, 12/09/2014, 09/22/2015, 08/30/2016, 09/05/2017, 07/31/2018, 08/25/2019, 09/08/2020  . Influenza,inj,quad, With Preservative 08/22/2017  .  Influenza-Unspecified 07/27/2011  . Tdap 12/16/2009, 08/18/2013   Past Medical History:  Diagnosis Date  . Allergic state 11/22/2011  . Anxiety 11/22/2011  . Back pain 01/31/2013  . Bartholin cyst 03/01/2013  . BPV (benign positional vertigo) 08/23/2013  . Chicken pox as a child  . Headache 11/22/2011   Overview:  Headache  ICD-10 cut over    . Hypertension   . Kidney stone 12/21/2012  . Other and unspecified hyperlipidemia 03/07/2014  . Plantar fasciitis 01/11/2012  . 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 Retrosternal chest pain 11/08/2015  . Sinusitis 11/22/2011  . Sleep apnea 04/03/2012   setting 2  . Visual changes 11/22/2011   Allergies  Allergen Reactions  . Topamax [Topiramate]     dizziness  . Zonegran [Zonisamide]     dizziness   Past Surgical History:  Procedure Laterality Date  . GUM SURGERY    . PERINEAL BODY REPAIR     4 th degree tear with fistula between vagina and retum requiring 3 surgeries for correction  . rectal fistula repair     x 2  . WISDOM TOOTH EXTRACTION     Family History  Problem Relation Age of Onset  . Diabetes Mother        type 2  . Hypertension Mother   . Hyperlipidemia Mother   . Hypertension Father   . Hyperlipidemia Father   . Aneurysm Father        abdominal aorta  . Cancer Maternal Grandmother 82       colon, ovarian  . Other Maternal Grandfather 41       brain hemorrhage  . Stroke Maternal Grandfather   . Aneurysm Maternal Grandfather   . Heart attack Paternal Grandmother   . Heart disease Paternal Grandmother        MI  . Alzheimer's disease Paternal Grandfather   . Dementia Paternal Grandfather   . Heart attack Paternal Aunt   . Heart disease Paternal Aunt         AAA rupture  . Other Paternal Uncle        cerebreal brain hemorrhage  . Stroke Paternal Uncle   . Aneurysm Paternal Uncle   . Asthma Daughter   . Other Daughter        peanut allergy  . Heart disease Maternal Aunt        s/p cabg. brain aneurysm s/p coiling    . Aneurysm Maternal Aunt        coil behind eye   Social History   Social History Narrative   Marital status/children/pets: 1 child.  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   Safety:      -smoke alarm in the home:Yes     - wears seatbelt: Yes     - Feels safe in their relationships: Yes    Allergies as of 09/08/2020      Reactions   Topamax [topiramate]    dizziness   Zonegran [zonisamide]    dizziness      Medication List       Accurate as of September 08, 2020 10:06  AM. If you have any questions, ask your nurse or doctor.        STOP taking these medications   tamsulosin 0.4 MG Caps capsule Commonly known as: FLOMAX Stopped by: Howard Pouch, DO     TAKE these medications   amLODipine 5 MG tablet Commonly known as: NORVASC TAKE 1 TABLET(5 MG) BY MOUTH DAILY   Multi-Vitamins Tabs Take by mouth.   sertraline 25 MG tablet Commonly known as: ZOLOFT Take 1 tablet (25 mg total) by mouth daily.       All past medical history, surgical history, allergies, family history, immunizations andmedications were updated in the EMR today and reviewed under the history and medication portions of their EMR.     No results found for this or any previous visit (from the past 2160 hour(s)).  MM 3D SCREEN BREAST BILATERAL  Result Date: 06/03/2020 CLINICAL DATA:  Screening. EXAM: DIGITAL SCREENING BILATERAL MAMMOGRAM WITH TOMO AND CAD COMPARISON:  Previous exam(s). ACR Breast Density Category b: There are scattered areas of fibroglandular density. FINDINGS: There are no findings suspicious for malignancy. Images were processed with CAD. IMPRESSION: No mammographic evidence of malignancy. A result letter of this screening mammogram will be mailed directly to the patient. RECOMMENDATION: Screening mammogram in one year. (Code:SM-B-01Y) BI-RADS CATEGORY  1: Negative. Electronically Signed   By: Evangeline Dakin M.D.   On: 06/03/2020 13:06    ROS:  14 pt review of systems performed and negative (unless mentioned in an HPI)  Objective: BP 129/83   Pulse 75   Temp 97.7 F (36.5 C) (Oral)   Ht 5' 5.25" (1.657 m)   Wt 223 lb (101.2 kg)   SpO2 99%   BMI 36.83 kg/m  Gen: Afebrile. No acute distress. Nontoxic in appearance, well-developed, well-nourished,  Pleasant female.  HENT: AT. [place]. Bilateral TM visualized and normal in appearance, normal external auditory canal. MMM, no oral lesions, adequate dentition. Bilateral nares within normal limits. Throat without erythema, ulcerations or exudates. no Cough on exam, no hoarseness on exam. Eyes:Pupils Equal Round Reactive to light, Extraocular movements intact,  Conjunctiva without redness, discharge or icterus. Neck/lymp/endocrine: Supple,no lymphadenopathy, no thyromegaly CV: RRR no murmur, no edema, +2/4 P posterior tibialis pulses.  Chest: CTAB, no wheeze, rhonchi or crackles. normal Respiratory effort. good Air movement. Abd: Soft. flat. NTND. BS present. no Masses palpated. No hepatosplenomegaly. No rebound tenderness or guarding. Skin: no rashes, purpura or petechiae. Warm and well-perfused. Skin intact. Neuro/Msk:  Normal gait. PERLA. EOMi. Alert. Oriented x3.  Cranial nerves II through XII intact. Muscle strength 5/5 upper/lower extremity. DTRs equal bilaterally. Psych: Normal affect, dress and demeanor. Normal speech. Normal thought content and judgment.   No exam data present  Assessment/plan: HAEVYN URY is a 44 y.o. female present for CPE. Essential hypertension/Hyperlipidemia, mixed/ Morbid obesity ([place]) Stable. continue amlodipine 5 mg daily Low-sodium diet.   Follow-up in 5.5 months  Leukemoid reaction Patient with chronic elevation in WBCs for the past 2 years.  Follows with Dr. Marin Olp hematology.  No identifiable causes.  Benign paroxysmal positional vertigo, unspecified laterality/PPPD/anxiety Stable.  Patient has been evaluated by to local neurologist  and [place] neurolog[facility].  PPD was the final diagnosis she has responded well to low-dose Zoloft 25 mg daily. continue zoloft Follow-up in 5.5 months  Need for influenza vaccination Provided today Vitamin D deficiency Continue supplement - Vitamin D (25 hydroxy) Diabetes mellitus screening - Hemoglobin A1c Encounter for hepatitis C screening test for low risk patient - Hepatitis  C antibody Colon cancer screening - Cologuard Encounter for preventive health examination Patient was encouraged to exercise greater than 150 minutes a week. Patient was encouraged to choose a diet filled with fresh fruits and vegetables, and lean meats. AVS provided to patient today for education/recommendation on gender specific health and safety maintenance. Colonoscopy: MGM colon cancer in her 75s. Pt elected cologuard screening> ordered.  Mammogram: completed:06/03/2020,[facility]> ordered for next year. Cervical cancer screening: last pap: 09/21/2018 with gyn. Immunizations: tdap 07/2013 UTD, Influenza administered today (encouraged yearly), covid series completed (she will call in with dates). Infectious disease screening: HIV completed, Hep C completed today DEXA:routine screen  Return in about 5 months (around 02/22/2021) for [place] (30 min) and 1 yr cpe.  Orders Placed This Encounter  Procedures  . MM 3D SCREEN BREAST BILATERAL  . Flu Vaccine QUAD 36+ mos IM  . Comprehensive metabolic panel  . Hemoglobin A1c  . Lipid panel  . TSH  . Hepatitis C antibody  . Vitamin D (25 hydroxy)  . Cologuard    Meds ordered this encounter  Medications  . amLODipine (NORVASC) 5 MG tablet    Sig: TAKE 1 TABLET(5 MG) BY MOUTH DAILY    Dispense:  90 tablet    Refill:  1  . sertraline (ZOLOFT) 25 MG tablet    Sig: Take 1 tablet (25 mg total) by mouth daily.    Dispense:  90 tablet    Refill:  1   Referral Orders  No referral(s) requested today     Electronically signed by: Howard Pouch, [place]

## 2020-09-08 NOTE — Patient Instructions (Signed)
Health Maintenance, Female Adopting a healthy lifestyle and getting preventive care are important in promoting health and wellness. Ask your health care provider about:  The right schedule for you to have regular tests and exams.  Things you can do on your own to prevent diseases and keep yourself healthy. What should I know about diet, weight, and exercise? Eat a healthy diet   Eat a diet that includes plenty of vegetables, fruits, low-fat dairy products, and lean protein.  Do not eat a lot of foods that are high in solid fats, added sugars, or sodium. Maintain a healthy weight Body mass index (BMI) is used to identify weight problems. It estimates body fat based on height and weight. Your health care provider can help determine your BMI and help you achieve or maintain a healthy weight. Get regular exercise Get regular exercise. This is one of the most important things you can do for your health. Most adults should:  Exercise for at least 150 minutes each week. The exercise should increase your heart rate and make you sweat (moderate-intensity exercise).  Do strengthening exercises at least twice a week. This is in addition to the moderate-intensity exercise.  Spend less time sitting. Even light physical activity can be beneficial. Watch cholesterol and blood lipids Have your blood tested for lipids and cholesterol at 46 years of age, then have this test every 5 years. Have your cholesterol levels checked more often if:  Your lipid or cholesterol levels are high.  You are older than 46 years of age.  You are at high risk for heart disease. What should I know about cancer screening? Depending on your health history and family history, you may need to have cancer screening at various ages. This may include screening for:  Breast cancer.  Cervical cancer.  Colorectal cancer.  Skin cancer.  Lung cancer. What should I know about heart disease, diabetes, and high blood  pressure? Blood pressure and heart disease  High blood pressure causes heart disease and increases the risk of stroke. This is more likely to develop in people who have high blood pressure readings, are of African descent, or are overweight.  Have your blood pressure checked: ? Every 3-5 years if you are 18-39 years of age. ? Every year if you are 40 years old or older. Diabetes Have regular diabetes screenings. This checks your fasting blood sugar level. Have the screening done:  Once every three years after age 40 if you are at a normal weight and have a low risk for diabetes.  More often and at a younger age if you are overweight or have a high risk for diabetes. What should I know about preventing infection? Hepatitis B If you have a higher risk for hepatitis B, you should be screened for this virus. Talk with your health care provider to find out if you are at risk for hepatitis B infection. Hepatitis C Testing is recommended for:  Everyone born from 1945 through 1965.  Anyone with known risk factors for hepatitis C. Sexually transmitted infections (STIs)  Get screened for STIs, including gonorrhea and chlamydia, if: ? You are sexually active and are younger than 46 years of age. ? You are older than 46 years of age and your health care provider tells you that you are at risk for this type of infection. ? Your sexual activity has changed since you were last screened, and you are at increased risk for chlamydia or gonorrhea. Ask your health care provider if   you are at risk.  Ask your health care provider about whether you are at high risk for HIV. Your health care provider may recommend a prescription medicine to help prevent HIV infection. If you choose to take medicine to prevent HIV, you should first get tested for HIV. You should then be tested every 3 months for as long as you are taking the medicine. Pregnancy  If you are about to stop having your period (premenopausal) and  you may become pregnant, seek counseling before you get pregnant.  Take 400 to 800 micrograms (mcg) of folic acid every day if you become pregnant.  Ask for birth control (contraception) if you want to prevent pregnancy. Osteoporosis and menopause Osteoporosis is a disease in which the bones lose minerals and strength with aging. This can result in bone fractures. If you are 65 years old or older, or if you are at risk for osteoporosis and fractures, ask your health care provider if you should:  Be screened for bone loss.  Take a calcium or vitamin D supplement to lower your risk of fractures.  Be given hormone replacement therapy (HRT) to treat symptoms of menopause. Follow these instructions at home: Lifestyle  Do not use any products that contain nicotine or tobacco, such as cigarettes, e-cigarettes, and chewing tobacco. If you need help quitting, ask your health care provider.  Do not use street drugs.  Do not share needles.  Ask your health care provider for help if you need support or information about quitting drugs. Alcohol use  Do not drink alcohol if: ? Your health care provider tells you not to drink. ? You are pregnant, may be pregnant, or are planning to become pregnant.  If you drink alcohol: ? Limit how much you use to 0-1 drink a day. ? Limit intake if you are breastfeeding.  Be aware of how much alcohol is in your drink. In the [place] one drink equals one 12 oz bottle of beer (355 mL), one 5 oz glass of wine (148 mL), or one 1 oz glass of hard liquor (44 mL). General instructions  Schedule regular health, dental, and eye exams.  Stay current with your vaccines.  Tell your health care provider if: ? You often feel depressed. ? You have ever been abused or do not feel safe at home. Summary  Adopting a healthy lifestyle and getting preventive care are important in promoting health and wellness.  Follow your health care provider's instructions about healthy  diet, exercising, and getting tested or screened for diseases.  Follow your health care provider's instructions on monitoring your cholesterol and blood pressure. This information is not intended to replace advice given to you by your health care provider. Make sure you discuss any questions you have with your health care provider. Document Revised: 10/01/2018 Document Reviewed: 10/01/2018 Elsevier Patient Education  2020 [employer]vier Inc.  

## 2020-09-09 ENCOUNTER — Telehealth: Payer: Self-pay | Admitting: Family Medicine

## 2020-09-09 ENCOUNTER — Telehealth: Payer: Self-pay

## 2020-09-09 LAB — HEPATITIS C ANTIBODY
Hepatitis C Ab: NONREACTIVE
SIGNAL TO CUT-OFF: 0.01 (ref <1.00)

## 2020-09-09 NOTE — Telephone Encounter (Signed)
Message via after hours service: Patient states she was seen 09/08/20 and was to call back and provide the following information for her chart: Covid vaccinations were Phizer 07/01/20 and 07/22/20; VitD intake multivitamin is Super C with Zinc and VitD 42m (10000IU).

## 2020-09-09 NOTE — Telephone Encounter (Signed)
Spoke with pt regarding labs and instructions. Pt states she is taking a total of 2000 IU of vit d

## 2020-09-09 NOTE — Telephone Encounter (Signed)
-----   Message from Ma Hillock, DO sent at 09/09/2020  7:45 AM EST ----- Please call patient Liver, kidney and thyroid function are normal Blood cell counts and electrolytes are normal Diabetes screening/A1c is normal  Cholesterol panel looks good. Vitamin D levels are low at 24.  Normal is above 30 and goal for bone health is around 40-50.  She reports she has taken a vitamin D supplement but she was on for sure how much.  I would encourage her to increase what ever her current vitamin D supplementation is by [phone] units daily.

## 2020-09-09 NOTE — Telephone Encounter (Signed)
rx updated

## 2020-09-29 LAB — COLOGUARD
COLOGUARD: NEGATIVE
Cologuard: NEGATIVE

## 2020-10-05 ENCOUNTER — Other Ambulatory Visit: Payer: Self-pay | Admitting: Family Medicine

## 2020-12-19 ENCOUNTER — Encounter: Payer: Self-pay | Admitting: Family Medicine

## 2020-12-19 ENCOUNTER — Telehealth (INDEPENDENT_AMBULATORY_CARE_PROVIDER_SITE_OTHER): Payer: 59 | Admitting: Family Medicine

## 2020-12-19 DIAGNOSIS — H8110 Benign paroxysmal vertigo, unspecified ear: Secondary | ICD-10-CM

## 2020-12-19 MED ORDER — AMLODIPINE BESYLATE 5 MG PO TABS: ORAL_TABLET | ORAL | 1 refills | Status: DC

## 2020-12-19 MED ORDER — SERTRALINE HCL 25 MG PO TABS: 25.0000 mg | ORAL_TABLET | Freq: Every day | ORAL | 1 refills | Status: DC

## 2020-12-19 MED ORDER — MECLIZINE HCL 25 MG PO TABS: 25.0000 mg | ORAL_TABLET | Freq: Three times a day (TID) | ORAL | 5 refills | Status: DC | PRN

## 2020-12-19 NOTE — Progress Notes (Signed)
   VIRTUAL VISIT VIA VIDEO  I connected with Erika Weaver on 12/19/20 at  3:00 PM EST by elemedicine application and verified that I am speaking with the correct person using two identifiers. Location patient: Home Location provider: [facility], Office Persons participating in the virtual visit: Patient, Dr. Raoul Pitch and Samul Dada, CMA  I discussed the limitations of evaluation and management by telemedicine and the availability of in person appointments. The patient expressed understanding and agreed to proceed.   SUBJECTIVE Chief Complaint  Patient presents with  . Dizziness    Pt c/o lightheadedness, imbalance intermittently x 4 days; PMH of vertigo     HPI: Erika Weaver is a 45 y.o. female present for follow up on vertigo.  She is compliant with her zoloft 25 mg qd. This has helped a great deal with her vertigo. She will still have about 5-7 days of vertigo flares every few months and take meclizine 25 mg Qd during those times. She has been evaluated by neuro> BPPV  ROS: See pertinent positives and negatives per HPI.  Patient Active Problem List   Diagnosis Date Noted  . Leukocytosis 08/25/2019  . Morbid obesity ([place]) 06/05/2019  . GERD (gastroesophageal reflux disease) 11/08/2015  . Essential hypertension 09/09/2014  . Hyperlipidemia, mixed 03/07/2014  . Persistent postural perceptional vertigo 08/23/2013  . 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 Sleep apnea 04/03/2012  . Anxiety 11/22/2011  . Herpes zoster 12/18/2010  . Allergic rhinitis 07/20/2010  . Headache 06/27/2010  . Vitamin D deficiency 06/27/2010  . Elevated blood-pressure reading without diagnosis of hypertension 06/27/2010    Social History   Tobacco Use  . Smoking status: Never Smoker  . Smokeless tobacco: Never Used  Substance Use Topics  . Alcohol use: No    Current Outpatient Medications:  .  amLODipine (NORVASC) 5 MG tablet, TAKE 1 TABLET(5 MG) BY MOUTH DAILY, Disp: 90 tablet,  Rfl: 1 .  Cholecalciferol (VITAMIN D3) 50 MCG (2000 UT) CAPS, Take by mouth., Disp: , Rfl:  .  Multiple Vitamin (MULTI-VITAMINS) TABS, Take by mouth., Disp: , Rfl:  .  sertraline (ZOLOFT) 25 MG tablet, Take 1 tablet (25 mg total) by mouth daily., Disp: 90 tablet, Rfl: 1 .  ZINC-VITAMIN C PO, Take by mouth., Disp: , Rfl:   Allergies  Allergen Reactions  . Topamax [Topiramate]     dizziness  . Zonegran [Zonisamide]     dizziness    OBJECTIVE: There were no vitals taken for this visit. Gen: No acute distress. Nontoxic in appearance.  HENT: AT. [place].  MMM.  Eyes:Pupils Equal Round Reactive to light, Extraocular movements intact,  Conjunctiva without redness, discharge or icterus. Neuro: Normal gait. Alert. Oriented x3  Psych: Normal affect and demeanor. Normal speech. Normal thought content and judgment.  ASSESSMENT AND PLAN: Erika Weaver is a 45 y.o. female present for  Benign paroxysmal positional vertigo, unspecified laterality Stable.  Continue zoloft 25 mg qd.  continue meclizine 25 mg TID PRN during flares.  Follow up with other cmc in 5.5 mos. Sooner if needed.    Howard Pouch, DO 12/19/2020   No follow-ups on file.  No orders of the defined types were placed in this encounter.  No orders of the defined types were placed in this encounter.  Referral Orders  No referral(s) requested today

## 2021-03-22 LAB — COLOGUARD: Cologuard: NEGATIVE

## 2021-03-22 LAB — HM COLONOSCOPY

## 2021-03-23 ENCOUNTER — Ambulatory Visit: Payer: 59 | Admitting: Family Medicine

## 2021-03-31 ENCOUNTER — Telehealth: Payer: Self-pay

## 2021-05-05 ENCOUNTER — Ambulatory Visit: Payer: 59 | Admitting: Hematology & Oncology

## 2021-05-05 ENCOUNTER — Other Ambulatory Visit: Payer: 59

## 2021-05-10 ENCOUNTER — Other Ambulatory Visit: Payer: Self-pay | Admitting: *Deleted

## 2021-05-10 DIAGNOSIS — D72823 Leukemoid reaction: Secondary | ICD-10-CM

## 2021-05-11 ENCOUNTER — Encounter: Payer: Self-pay | Admitting: Hematology & Oncology

## 2021-05-11 ENCOUNTER — Inpatient Hospital Stay: Payer: 59 | Admitting: Hematology & Oncology

## 2021-05-11 ENCOUNTER — Inpatient Hospital Stay: Payer: 59 | Attending: Hematology & Oncology

## 2021-05-11 ENCOUNTER — Other Ambulatory Visit: Payer: Self-pay

## 2021-05-11 VITALS — BP 129/60 | HR 71 | Temp 98.3°F | Resp 16 | Wt 225.0 lb

## 2021-05-11 DIAGNOSIS — D72829 Elevated white blood cell count, unspecified: Secondary | ICD-10-CM | POA: Diagnosis present

## 2021-05-11 DIAGNOSIS — D72823 Leukemoid reaction: Secondary | ICD-10-CM

## 2021-05-11 LAB — CMP (CANCER CENTER ONLY)
ALT: 19 U/L (ref 0–44)
AST: 17 U/L (ref 15–41)
Albumin: 3.9 g/dL (ref 3.5–5.0)
Alkaline Phosphatase: 69 U/L (ref 38–126)
Anion gap: 5 (ref 5–15)
BUN: 14 mg/dL (ref 6–20)
CO2: 30 mmol/L (ref 22–32)
Calcium: 9.7 mg/dL (ref 8.9–10.3)
Chloride: 101 mmol/L (ref 98–111)
Creatinine: 0.76 mg/dL (ref 0.44–1.00)
GFR, Estimated: >60 mL/min (ref >60)
Glucose, Bld: 100 mg/dL — ABNORMAL HIGH (ref 70–99)
Potassium: 4.4 mmol/L (ref 3.5–5.1)
Sodium: 136 mmol/L (ref 135–145)
Total Bilirubin: 0.5 mg/dL (ref 0.3–1.2)
Total Protein: 7.3 g/dL (ref 6.5–8.1)

## 2021-05-11 LAB — CBC WITH DIFFERENTIAL (CANCER CENTER ONLY)
Abs Immature Granulocytes: 0.02 10*3/uL (ref 0.00–0.07)
Basophils Absolute: 0.1 10*3/uL (ref 0.0–0.1)
Basophils Relative: 1 %
Eosinophils Absolute: 0.2 10*3/uL (ref 0.0–0.5)
Eosinophils Relative: 2 %
HCT: 41.5 % (ref 36.0–46.0)
Hemoglobin: 14.2 g/dL (ref 12.0–15.0)
Immature Granulocytes: 0 %
Lymphocytes Relative: 27 %
Lymphs Abs: 2.6 10*3/uL (ref 0.7–4.0)
MCH: 31.4 pg (ref 26.0–34.0)
MCHC: 34.2 g/dL (ref 30.0–36.0)
MCV: 91.8 fL (ref 80.0–100.0)
Monocytes Absolute: 0.8 10*3/uL (ref 0.1–1.0)
Monocytes Relative: 8 %
Neutro Abs: 6.1 10*3/uL (ref 1.7–7.7)
Neutrophils Relative %: 62 %
Platelet Count: 299 10*3/uL (ref 150–400)
RBC: 4.52 MIL/uL (ref 3.87–5.11)
RDW: 12.5 % (ref 11.5–15.5)
WBC Count: 9.8 10*3/uL (ref 4.0–10.5)
nRBC: 0 % (ref 0.0–0.2)

## 2021-05-11 LAB — SAVE SMEAR(SSMR), FOR PROVIDER SLIDE REVIEW

## 2021-05-11 NOTE — Progress Notes (Signed)
Hematology and Oncology Follow Up Visit  Erika Weaver [id number] 04/25/1976 45 y.o. 05/11/2021   Principle Diagnosis:  Leukocytosis - leukemoid reaction   Current Therapy:        Observation   Interim History:  Erika Weaver is here today for Erika annual follow-up.  She has been quite busy.  Erika Weaver, who is a very good Engineer, maintenance (IT), has been doing a lot of traveling softball.  They will be going down to [place] for a tournament this weekend.  Otherwise, she is doing well.  She has had no problems with fever.  She has had no exposure to COVID.  Erika husband had it but she was fine..  She has had no change in bowel or bladder habits.  She does do the Cologuard.  She is due for mammogram in August.  She has had no problems with swollen lymph nodes.  She has had no rashes.  There is been no bleeding.  She still has Erika monthly cycles.  Currently, Erika performance status is ECOG 0.  Medications:  Allergies as of 05/11/2021       Reactions   Topamax [topiramate]    dizziness   Zonegran [zonisamide]    dizziness        Medication List        Accurate as of May 11, 2021 10:35 AM. If you have any questions, ask your nurse or doctor.          STOP taking these medications    meclizine 25 MG tablet Commonly known as: ANTIVERT Stopped by: Volanda Napoleon, MD   vitamin D3 50 MCG (2000 UT) Caps Stopped by: Volanda Napoleon, MD   ZINC-VITAMIN C PO Stopped by: Volanda Napoleon, MD       TAKE these medications    amLODipine 5 MG tablet Commonly known as: NORVASC TAKE 1 TABLET(5 MG) BY MOUTH DAILY   Multi-Vitamins Tabs Take by mouth.   sertraline 25 MG tablet Commonly known as: ZOLOFT Take 1 tablet (25 mg total) by mouth daily.        Allergies:  Allergies  Allergen Reactions   Topamax [Topiramate]     dizziness   Zonegran [Zonisamide]     dizziness    Past Medical History, Surgical history, Social history, and Family History were reviewed and  updated.  Review of Systems: Review of Systems  Constitutional: Negative.   HENT: Negative.    Eyes: Negative.   Respiratory: Negative.    Cardiovascular: Negative.   Gastrointestinal: Negative.   Genitourinary: Negative.  Negative for dysuria.  Musculoskeletal: Negative.   Skin: Negative.   Neurological: Negative.   Endo/Heme/Allergies: Negative.   Psychiatric/Behavioral: Negative.      Physical Exam:  weight is 225 lb (102.1 kg). Erika oral temperature is 98.3 F (36.8 C). Erika blood pressure is 129/60 and Erika pulse is 71. Erika respiration is 16 and oxygen saturation is 100%.   Wt Readings from Last 3 Encounters:  05/11/21 225 lb (102.1 kg)  09/08/20 223 lb (101.2 kg)  05/06/20 221 lb (100.2 kg)    Physical Exam Vitals reviewed.  HENT:     Head: Normocephalic and atraumatic.  Eyes:     Pupils: Pupils are equal, round, and reactive to light.  Cardiovascular:     Rate and Rhythm: Normal rate and regular rhythm.     Heart sounds: Normal heart sounds.  Pulmonary:     Effort: Pulmonary effort is normal.     Breath sounds:  Normal breath sounds.  Abdominal:     General: Bowel sounds are normal.     Palpations: Abdomen is soft.  Musculoskeletal:        General: No tenderness or deformity. Normal range of motion.     Cervical back: Normal range of motion.  Lymphadenopathy:     Cervical: No cervical adenopathy.  Skin:    General: Skin is warm and dry.     Findings: No erythema or rash.  Neurological:     Mental Status: She is alert and oriented to person, place, and time.  Psychiatric:        Behavior: Behavior normal.        Thought Content: Thought content normal.        Judgment: Judgment normal.    Lab Results  Component Value Date   WBC 9.8 05/11/2021   HGB 14.2 05/11/2021   HCT 41.5 05/11/2021   MCV 91.8 05/11/2021   PLT 299 05/11/2021   Lab Results  Component Value Date   FERRITIN 45.6 12/22/2015   Lab Results  Component Value Date   RBC 4.52  05/11/2021   No results found for: KPAFRELGTCHN, LAMBDASER, KAPLAMBRATIO No results found for: IGGSERUM, IGA, IGMSERUM No results found for: Odetta Pink, SPEI   Chemistry      Component Value Date/Time   NA 136 05/11/2021 0928   K 4.4 05/11/2021 0928   CL 101 05/11/2021 0928   CO2 30 05/11/2021 0928   BUN 14 05/11/2021 0928   CREATININE 0.76 05/11/2021 0928   CREATININE 0.72 05/27/2014 0944      Component Value Date/Time   CALCIUM 9.7 05/11/2021 0928   ALKPHOS 69 05/11/2021 0928   AST 17 05/11/2021 0928   ALT 19 05/11/2021 0928   BILITOT 0.5 05/11/2021 0928       Impression and Plan: [name] is a very pleasant 45 yo caucasian female with intermittent leukocytosis.   Erythema looks fantastic.  I looked at Erika blood smear.  I did not see anything on the blood smear that looked suspicious.  I really do not think we have to have Erika come back to see [place].  I just do not believe that she has any underlying myeloproliferative problem.  I think any leukocytosis might be reactive.  We will be more than happy to see Erika back at any time if there are any issues.  I know that she is incredibly busy.  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    Volanda Napoleon, MD 7/21/202210:35 AM

## 2021-05-12 ENCOUNTER — Telehealth: Payer: Self-pay

## 2021-05-12 NOTE — Telephone Encounter (Signed)
No 05/11/21 los noted   [employer]

## 2021-06-05 ENCOUNTER — Inpatient Hospital Stay: Admission: RE | Admit: 2021-06-05 | Payer: 59 | Source: Ambulatory Visit

## 2021-06-06 ENCOUNTER — Other Ambulatory Visit: Payer: Self-pay

## 2021-06-06 ENCOUNTER — Ambulatory Visit
Admission: RE | Admit: 2021-06-06 | Discharge: 2021-06-06 | Disposition: A | Discharge status: Home / Self Care | Payer: 59 | Source: Ambulatory Visit | Attending: Family Medicine | Admitting: Family Medicine

## 2021-06-06 DIAGNOSIS — Z1231 Encounter for screening mammogram for malignant neoplasm of breast: Secondary | ICD-10-CM

## 2021-09-27 ENCOUNTER — Other Ambulatory Visit: Payer: Self-pay

## 2021-09-27 MED ORDER — SERTRALINE HCL 25 MG PO TABS: 25.0000 mg | ORAL_TABLET | Freq: Every day | ORAL | 0 refills | Status: DC

## 2021-09-27 MED ORDER — AMLODIPINE BESYLATE 5 MG PO TABS
ORAL_TABLET | ORAL | 0 refills | Status: DC
Start: 2021-09-27 — End: 2021-11-14

## 2021-10-24 ENCOUNTER — Other Ambulatory Visit: Payer: Self-pay | Admitting: Family Medicine

## 2021-11-01 ENCOUNTER — Telehealth: Payer: Self-pay | Admitting: Family Medicine

## 2021-11-01 NOTE — Telephone Encounter (Signed)
Needing refill on    sertraline sertraline (ZOLOFT) 25 MG tablet   [id number] - [place], [place] - [address] [place] [address] Phone:  [phone]  Fax:  [phone]

## 2021-11-02 MED ORDER — SERTRALINE HCL 25 MG PO TABS: 25.0000 mg | ORAL_TABLET | Freq: Every day | ORAL | 0 refills | Status: DC

## 2021-11-02 NOTE — Telephone Encounter (Signed)
Rx sent 

## 2021-11-15 ENCOUNTER — Encounter: Payer: Self-pay | Admitting: Family Medicine

## 2021-11-15 ENCOUNTER — Ambulatory Visit (INDEPENDENT_AMBULATORY_CARE_PROVIDER_SITE_OTHER): Payer: 59 | Admitting: Family Medicine

## 2021-11-15 VITALS — BP 120/74 | HR 75 | Temp 97.8°F | Ht 65.25 in | Wt 219.0 lb

## 2021-11-15 DIAGNOSIS — F419 Anxiety disorder, unspecified: Secondary | ICD-10-CM | POA: Diagnosis not present

## 2021-11-15 DIAGNOSIS — I10 Essential (primary) hypertension: Secondary | ICD-10-CM | POA: Diagnosis not present

## 2021-11-15 DIAGNOSIS — E782 Mixed hyperlipidemia: Secondary | ICD-10-CM

## 2021-11-15 DIAGNOSIS — Z23 Encounter for immunization: Secondary | ICD-10-CM | POA: Diagnosis not present

## 2021-11-15 DIAGNOSIS — D72823 Leukemoid reaction: Secondary | ICD-10-CM

## 2021-11-15 MED ORDER — AMLODIPINE BESYLATE 5 MG PO TABS: ORAL_TABLET | ORAL | 1 refills | Status: DC

## 2021-11-15 MED ORDER — SERTRALINE HCL 50 MG PO TABS: 50.0000 mg | ORAL_TABLET | Freq: Every day | ORAL | 1 refills | Status: DC

## 2021-11-15 NOTE — Patient Instructions (Signed)
°  Great to see you today.  I have refilled the medication(s) we provide.   If labs were collected, we will inform you of lab results once received either by echart message or telephone call.   - echart message- for normal results that have been seen by the patient already.   - telephone call: abnormal results or if patient has not viewed results in their echart.   Next appt schedule as your physical with fasting labs.

## 2021-11-15 NOTE — Progress Notes (Signed)
This visit occurred during the SARS-CoV-2 public health emergency.  Safety protocols were in place, including screening questions prior to the visit, additional usage of staff PPE, and extensive cleaning of exam room while observing appropriate contact time as indicated for disinfecting solutions.    Patient ID: Hedda Slade, female  DOB: 06-15-1976, 46 y.o.   MRN: [id number] Patient Care Team    Relationship Specialty Notifications Start End  Ma Hillock, DO PCP - General Family Medicine  07/15/19   Molli Posey, MD Consulting Physician Obstetrics and Gynecology  08/30/16   Volanda Napoleon, MD Consulting Physician Oncology  08/25/19   Josephina Gip, MD Referring Physician Internal Medicine  08/25/19   Lorretta Harp, MD Consulting Physician Cardiology  08/25/19   Ladene Artist, MD Consulting Physician Gastroenterology  08/25/19     Chief Complaint  Patient presents with   Anxiety    [place]; pt is not fasting    Subjective: SWAN FAIRFAX is a 46 y.o.  Female  present for  All past medical history, surgical history, allergies, family history, immunizations, medications and social history were updated in the electronic medical record today. All recent labs, ED visits and hospitalizations within the last year were reviewed.  Essential hypertension/Hyperlipidemia, mixed/Morbid obesity ([place]) Pt reports compliance  with amlodipine 5 mg daily. Patient denies chest pain, shortness of breath, dizziness or lower extremity edema.  Pt does not daily baby ASA. Pt is not prescribed statin.   Anxiety/persistent postural perceptional vertigo Patient reports she was having dizziness and was getting frustrated secondary to diagnosis unable to be identified.  She reports she still to neurologist, cardiologist and then went on to a neurologist in [place] system.  She reports they diagnosed her w/ PPPV and started her on low-dose Zoloft.  Since that time she has had a great response to the  medication with little side effects- until recently she had a recurrence.    Depression screen [facility] 2/9 11/15/2021 09/08/2020 08/25/2019 06/05/2019 07/31/2018  Decreased Interest 0 0 1 0 0  Down, Depressed, Hopeless 0 0 0 0 0  PHQ - 2 Score 0 0 1 0 0  Altered sleeping 0 - - 0 0  Tired, decreased energy 0 - - 0 0  Change in appetite 0 - - 0 0  Feeling bad or failure about yourself  0 - - 0 0  Trouble concentrating 0 - - 0 0  Moving slowly or fidgety/restless 0 - - 0 0  Suicidal thoughts 0 - - 0 0  PHQ-9 Score 0 - - 0 0  Difficult doing work/chores - - - Not difficult at all Not difficult at all   GAD 7 : Generalized Anxiety Score 11/15/2021 03/25/2020 08/25/2019  Nervous, Anxious, on Edge 0 0 0  Control/stop worrying 0 0 0  Worry too much - different things 0 0 0  Trouble relaxing 0 0 0  Restless 0 0 0  Easily annoyed or irritable 0 0 0  Afraid - awful might happen 0 0 0  Total GAD 7 Score 0 0 0  Anxiety Difficulty - Not difficult at all Not difficult at all   Immunization History  Administered Date(s) Administered   Influenza Split 07/22/2009   Influenza Whole 07/23/2011   Influenza,inj,Quad PF,6+ Mos 08/18/2013, 12/09/2014, 09/22/2015, 08/30/2016, 09/05/2017, 07/31/2018, 08/25/2019, 09/08/2020, 11/15/2021   Influenza,inj,quad, With Preservative 08/22/2017   Influenza-Unspecified 07/27/2011   PFIZER(Purple Top)SARS-COV-2 Vaccination 07/01/2020, 07/22/2020   Tdap 12/16/2009, 08/18/2013   Past Medical  History:  Diagnosis Date   Allergic state 11/22/2011   Anxiety 11/22/2011   Back pain 01/31/2013   Bartholin cyst 03/01/2013   BPV (benign positional vertigo) 08/23/2013   Chicken pox as a child   Headache 11/22/2011   Overview:  Headache  ICD-10 cut over     Hypertension    Kidney stone 12/21/2012   Other and unspecified hyperlipidemia 03/07/2014   Plantar fasciitis 01/11/2012   Rectal vaginal fistula 01/31/2013   Retrosternal chest pain 11/08/2015   Sinusitis 11/22/2011   Sleep apnea  04/03/2012   setting 2   Visual changes 11/22/2011   Allergies  Allergen Reactions   Topamax [Topiramate]     dizziness   Zonegran [Zonisamide]     dizziness   Past Surgical History:  Procedure Laterality Date   GUM SURGERY     PERINEAL BODY REPAIR     4 th degree tear with fistula between vagina and retum requiring 3 surgeries for correction   rectal fistula repair     x 2   WISDOM TOOTH EXTRACTION     Family History  Problem Relation Age of Onset   Diabetes Mother        type 2   Hypertension Mother    Hyperlipidemia Mother    Hypertension Father    Hyperlipidemia Father    Aneurysm Father        abdominal aorta   Cancer Maternal Grandmother 82       colon, ovarian   Other Maternal Grandfather 73       brain hemorrhage   Stroke Maternal Grandfather    Aneurysm Maternal Grandfather    Heart attack Paternal Grandmother    Heart disease Paternal Grandmother        MI   Alzheimer's disease Paternal Grandfather    Dementia Paternal Grandfather    Heart attack Paternal Aunt    Heart disease Paternal Aunt         AAA rupture   Other Paternal Uncle        cerebreal brain hemorrhage   Stroke Paternal Uncle    Aneurysm Paternal Uncle    Asthma Daughter    Other Daughter        peanut allergy   Heart disease Maternal Aunt        s/p cabg. brain aneurysm s/p coiling   Aneurysm Maternal Aunt        coil behind eye   Social History   Social History Narrative   Marital status/children/pets: 1 child.  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   Safety:      -smoke alarm in the home:Yes     - wears seatbelt: Yes     - Feels safe in their relationships: Yes    Allergies as of 11/15/2021       Reactions   Topamax [topiramate]    dizziness   Zonegran [zonisamide]    dizziness        Medication List        Accurate as of November 15, 2021  3:36 PM. If you have any questions, ask your nurse or doctor.          STOP  taking these medications    amLODipine-atorvastatin 5-10 MG tablet Commonly known as: CADUET Stopped by: Howard Pouch, DO       TAKE these medications    amLODipine 5 MG tablet Commonly known as: NORVASC TAKE 1 TABLET(5 MG) BY MOUTH DAILY  meclizine 25 MG tablet Commonly known as: ANTIVERT Take by mouth.   Multi-Vitamins Tabs Take by mouth.   sertraline 25 MG tablet Commonly known as: ZOLOFT Take 1 tablet (25 mg total) by mouth daily.        All past medical history, surgical history, allergies, family history, immunizations andmedications were updated in the EMR today and reviewed under the history and medication portions of their EMR.     No results found for this or any previous visit (from the past 2160 hour(s)).    ROS: 14 pt review of systems performed and negative (unless mentioned in an HPI)  Objective: BP 120/74    Pulse 75    Temp 97.8 F (36.6 C) (Oral)    Ht 5' 5.25" (1.657 m)    Wt 219 lb (99.3 kg)    LMP 11/13/2021    SpO2 99%    BMI 36.16 kg/m  Physical Exam Vitals and nursing note reviewed.  Constitutional:      General: She is not in acute distress.    Appearance: Normal appearance. She is obese. She is not ill-appearing, toxic-appearing or diaphoretic.  HENT:     Head: Normocephalic and atraumatic.  Eyes:     General: No scleral icterus.       Right eye: No discharge.        Left eye: No discharge.     Extraocular Movements: Extraocular movements intact.     Conjunctiva/sclera: Conjunctivae normal.     Pupils: Pupils are equal, round, and reactive to light.  Cardiovascular:     Rate and Rhythm: Normal rate and regular rhythm.  Pulmonary:     Effort: Pulmonary effort is normal. No respiratory distress.     Breath sounds: Normal breath sounds. No wheezing, rhonchi or rales.  Musculoskeletal:     Cervical back: Neck supple. No tenderness.  Lymphadenopathy:     Cervical: No cervical adenopathy.  Skin:    General: Skin is warm and dry.      Coloration: Skin is not jaundiced or pale.     Findings: No erythema or rash.  Neurological:     Mental Status: She is alert and oriented to person, place, and time. Mental status is at baseline.     Motor: No weakness.     Gait: Gait normal.  Psychiatric:        Mood and Affect: Mood normal.        Behavior: Behavior normal.        Thought Content: Thought content normal.        Judgment: Judgment normal.    No results found.  Assessment/plan: NAREH MATZKE is a 46 y.o. female present for Essential hypertension/Hyperlipidemia, mixed/ Morbid obesity (HCC)/Body mass index is 36.16 kg/m. Stable.  Stable amlodipine 5 mg daily Low-sodium diet.   Follow-up in 5.5 months   Leukemoid reaction Patient with chronic elevation in WBCs for the past 2 years.  Follows with Dr. Marin Olp hematology.  No identifiable causes.   Benign paroxysmal positional vertigo, unspecified laterality/PPPD/anxiety Stable.  Patient has been evaluated by to local neurologist and [facility].  PPD was the final diagnosis she has responded well to low-dose Zoloft but recently more frequently  Increase zoloft to 50 mg Follow-up in 5.5 months  Influenza vaccine provided today.    Return in about 24 weeks (around 05/02/2022) for CPE (30 min), [facility] (30 min).  Orders Placed This Encounter  Procedures   Flu Vaccine QUAD 6+ mos PF IM (Fluarix  Quad PF)    No orders of the defined types were placed in this encounter.  Referral Orders  No referral(s) requested today     Electronically signed by: Howard Pouch, [place]

## 2021-11-16 ENCOUNTER — Inpatient Hospital Stay: Payer: 59

## 2021-11-16 ENCOUNTER — Inpatient Hospital Stay: Payer: 59 | Admitting: Hematology & Oncology

## 2021-11-30 LAB — CBC AND DIFFERENTIAL: Hemoglobin: 13.8 (ref 12.0–16.0)

## 2022-02-12 ENCOUNTER — Encounter: Payer: Self-pay | Admitting: Family Medicine

## 2022-02-12 ENCOUNTER — Ambulatory Visit (INDEPENDENT_AMBULATORY_CARE_PROVIDER_SITE_OTHER): Payer: 59 | Admitting: Family Medicine

## 2022-02-12 VITALS — BP 116/74 | HR 75 | Temp 97.3°F | Ht 62.25 in | Wt 220.0 lb

## 2022-02-12 DIAGNOSIS — R0689 Other abnormalities of breathing: Secondary | ICD-10-CM | POA: Diagnosis not present

## 2022-02-12 DIAGNOSIS — J302 Other seasonal allergic rhinitis: Secondary | ICD-10-CM

## 2022-02-12 DIAGNOSIS — F419 Anxiety disorder, unspecified: Secondary | ICD-10-CM

## 2022-02-12 DIAGNOSIS — J4521 Mild intermittent asthma with (acute) exacerbation: Secondary | ICD-10-CM | POA: Diagnosis not present

## 2022-02-12 MED ORDER — ALBUTEROL SULFATE (2.5 MG/3ML) 0.083% IN NEBU
2.5000 mg | INHALATION_SOLUTION | Freq: Once | RESPIRATORY_TRACT | Status: AC
  Administered 2022-02-12: 2.5 mg via RESPIRATORY_TRACT

## 2022-02-12 MED ORDER — ALBUTEROL SULFATE HFA 108 (90 BASE) MCG/ACT IN AERS: 2.0000 | INHALATION_SPRAY | Freq: Four times a day (QID) | RESPIRATORY_TRACT | 2 refills | Status: DC | PRN

## 2022-02-12 MED ORDER — METHYLPREDNISOLONE ACETATE 80 MG/ML IJ SUSP
80.0000 mg | Freq: Once | INTRAMUSCULAR | Status: AC
  Administered 2022-02-12: 80 mg via INTRAMUSCULAR

## 2022-02-12 MED ORDER — BUDESONIDE 90 MCG/ACT IN AEPB: 1.0000 | INHALATION_SPRAY | Freq: Two times a day (BID) | RESPIRATORY_TRACT | 2 refills | Status: DC

## 2022-02-12 NOTE — Patient Instructions (Signed)
Albuterol is rescue ?Symbicort 1 puff twice a day for 4 weeks. ? ?Asthma, Adult ? ?Asthma is a condition that causes swelling and narrowing of the airways. These are the passages that lead from the nose and mouth down into the lungs. When asthma symptoms get worse it is called an asthma attack or flare. This can make it hard to breathe. Asthma flares can range from minor to life-threatening. There is no cure for asthma, but medicines and lifestyle changes can help to control it. ?What are the causes? ?It is not known exactly what causes asthma, but certain things can cause asthma symptoms to get worse (triggers). ?What can trigger an asthma attack? ?Cigarette smoke. ?Mold. ?Dust. ?Your pet's skin flakes (dander). ?Cockroaches. ?Pollen. ?Air pollution (like household cleaners, wood smoke, smog, or Advertising account planner). ?What are the signs or symptoms? ?Trouble breathing (shortness of breath). ?Coughing. ?Making high-pitched whistling sounds when you breathe, most often when you breathe out (wheezing). ?Chest tightness. ?Tiredness with little activity. ?Poor exercise tolerance. ?How is this treated? ?Controller medicines that help prevent asthma symptoms. ?Fast-acting reliever or rescue medicines. These give short-term relief of asthma symptoms. ?Allergy medicines if your attacks are brought on by allergens. ?Medicines to help control the body's defense (immune) system. ?Staying away from the things that cause asthma attacks. ?Follow these instructions at home: ?Avoiding triggers in your home ?Do not allow anyone to smoke in your home. ?Limit use of fireplaces and wood stoves. ?Get rid of pests (such as roaches and mice) and their droppings. ?Keep your home clean. ?Clean your floors. Dust regularly. Use cleaning products that do not smell. ?Wash bed sheets and blankets every week in hot water. Dry them in a dryer. ?Have someone vacuum when you are not home. ?Change your heating and air conditioning filters often. ?Use  blankets that are made of polyester or cotton. ?General instructions ?Take over-the-counter and prescription medicines only as told by your doctor. ?Do not smoke or use any products that contain nicotine or tobacco. If you need help quitting, ask your doctor. ?Stay away from secondhand smoke. ?Avoid doing things outdoors when allergen counts are high and when air quality is low. ?Warm up before you exercise. Take time to cool down after exercise. ?Use a peak flow meter as told by your doctor. A peak flow meter is a tool that measures how well your lungs are working. ?Keep track of the peak flow meter's readings. Write them down. ?Follow your asthma action plan. This is a written plan for taking care of your asthma and treating your attacks. ?Make sure you get all the shots (vaccines) that your doctor recommends. Ask your doctor about a flu shot and a pneumonia shot. ?Keep all follow-up visits. ?Contact a doctor if: ?You have wheezing, shortness of breath, or a cough even while taking medicine to prevent attacks. ?The mucus you cough up (sputum) is thicker than usual. ?The mucus you cough up changes from clear or white to yellow, green, gray, or is bloody. ?You have problems from the medicine you are taking, such as: ?A rash. ?Itching. ?Swelling. ?Trouble breathing. ?You need reliever medicines more than 2-3 times a week. ?Your peak flow reading is still at 50-79% of your personal best after following the action plan for 1 hour. ?You have a fever. ?Get help right away if: ?You seem to be worse and are not responding to medicine during an asthma attack. ?You are short of breath even at rest. ?You get short of breath when doing  very little activity. ?You have trouble eating, drinking, or talking. ?You have chest pain or tightness. ?You have a fast heartbeat. ?Your lips or fingernails start to turn blue. ?You are light-headed or dizzy, or you faint. ?Your peak flow is less than 50% of your personal best. ?You feel too  tired to breathe normally. ?These symptoms may be an emergency. Get help right away. Call 911. ?Do not wait to see if the symptoms will go away. ?Do not drive yourself to the hospital. ?Summary ?Asthma is a long-term (chronic) condition in which the airways get tight and narrow. An asthma attack can make it hard to breathe. ?Asthma cannot be cured, but medicines and lifestyle changes can help control it. ?Make sure you understand how to avoid triggers and how and when to use your medicines. ?Avoid things that can cause allergy symptoms (allergens). These include animal skin flakes (dander) and pollen from trees or grass. ?Avoid things that pollute the air. These may include household cleaners, wood smoke, smog, or chemical odors. ?This information is not intended to replace advice given to you by your health care provider. Make sure you discuss any questions you have with your health care provider. ?Document Revised: 07/17/2021 Document Reviewed: 07/17/2021 ?Elsevier Patient Education ? [place]. ? ?

## 2022-02-12 NOTE — Progress Notes (Signed)
? ? ? ?This visit occurred during the SARS-CoV-2 public health emergency.  Safety protocols were in place, including screening questions prior to the visit, additional usage of staff PPE, and extensive cleaning of exam room while observing appropriate contact time as indicated for disinfecting solutions.  ? ? ?Erika Weaver , [birth year], 46 y.o., female ?MRN: [id number] ?Patient Care Team  ?  Relationship Specialty Notifications Start End  ?Ma Hillock, DO PCP - General Family Medicine  07/15/19   ?Molli Posey, MD Consulting Physician Obstetrics and Gynecology  08/30/16   ?Volanda Napoleon, MD Consulting Physician Oncology  08/25/19   ?Josephina Gip, MD Referring Physician Internal Medicine  08/25/19   ?Lorretta Harp, MD Consulting Physician Cardiology  08/25/19   ?Ladene Artist, MD Consulting Physician Gastroenterology  08/25/19   ? ? ?Chief Complaint  ?Patient presents with  ? Fatigue  ?  Feeling winded x1 day  ? ?  ?Subjective: Pt presents for an OV with complaints of of a mild chest tightness sensation for little over 24 hours.  She is not certain if this is anxiety or lung/allergy related.  She thinks it may be a little bit of both.  She denies any fevers or chills.  She endorses fatigue.  She has not had a recent illness that she is aware.  She is not aware if she has had COVID in the past.  She has not had a history of asthma.  She does have seasonal allergies.  She does not feel like she has had added stress or anxiety but does admit the last few days have been overly busy.  She is prescribed Zoloft 50 mg daily, which was originally started for her chronic vertigo. ? ? ?  02/12/2022  ?  2:03 PM 11/15/2021  ?  3:31 PM 09/08/2020  ?  9:23 AM 08/25/2019  ?  2:42 PM 06/05/2019  ?  8:02 AM  ?Depression screen PHQ 2/9  ?Decreased Interest 0 0 0 1 0  ?Down, Depressed, Hopeless 0 0 0 0 0  ?PHQ - 2 Score 0 0 0 1 0  ?Altered sleeping 0 0   0  ?Tired, decreased energy 0 0   0  ?Change in appetite 0 0   0   ?Feeling bad or failure about yourself  0 0   0  ?Trouble concentrating 0 0   0  ?Moving slowly or fidgety/restless 0 0   0  ?Suicidal thoughts 0 0   0  ?PHQ-9 Score 0 0   0  ?Difficult doing work/chores     Not difficult at all  ? ? ?  02/12/2022  ?  2:06 PM 11/15/2021  ?  3:31 PM 03/25/2020  ?  9:46 AM 08/25/2019  ?  2:42 PM  ?GAD 7 : Generalized Anxiety Score  ?Nervous, Anxious, on Edge 0 0 0 0  ?Control/stop worrying 0 0 0 0  ?Worry too much - different things 0 0 0 0  ?Trouble relaxing 0 0 0 0  ?Restless 0 0 0 0  ?Easily annoyed or irritable 0 0 0 0  ?Afraid - awful might happen 0 0 0 0  ?Total GAD 7 Score 0 0 0 0  ?Anxiety Difficulty   Not difficult at all Not difficult at all  ? ? ? ? ?Allergies  ?Allergen Reactions  ? Topamax [Topiramate]   ?  dizziness  ? Zonegran [Zonisamide]   ?  dizziness  ? ?Social History  ? ?Social History  Narrative  ? Marital status/children/pets: 1 child.  Married   ? 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  ? Safety:   ?   -smoke alarm in the home:Yes  ?   - wears seatbelt: Yes  ?   - Feels safe in their relationships: Yes  ? ?Past Medical History:  ?Diagnosis Date  ? Allergic state 11/22/2011  ? Anxiety 11/22/2011  ? Back pain 01/31/2013  ? Bartholin cyst 03/01/2013  ? BPV (benign positional vertigo) 08/23/2013  ? Chicken pox as a child  ? Headache 11/22/2011  ? Overview:  Headache  ICD-10 cut over    ? Hypertension   ? Kidney stone 12/21/2012  ? Other and unspecified hyperlipidemia 03/07/2014  ? Plantar fasciitis 01/11/2012  ? Rectal vaginal fistula 01/31/2013  ? Retrosternal chest pain 11/08/2015  ? Sinusitis 11/22/2011  ? Sleep apnea 04/03/2012  ? setting 2  ? Visual changes 11/22/2011  ? ?Past Surgical History:  ?Procedure Laterality Date  ? GUM SURGERY    ? PERINEAL BODY REPAIR    ? 4 th degree tear with fistula between vagina and retum requiring 3 surgeries for correction  ? rectal fistula repair    ? x 2  ? WISDOM TOOTH EXTRACTION    ? ?Family History  ?Problem  Relation Age of Onset  ? Diabetes Mother   ?     type 2  ? Hypertension Mother   ? Hyperlipidemia Mother   ? Hypertension Father   ? Hyperlipidemia Father   ? Aneurysm Father   ?     abdominal aorta  ? Cancer Maternal Grandmother 82  ?     colon, ovarian  ? Other Maternal Grandfather 51  ?     brain hemorrhage  ? Stroke Maternal Grandfather   ? Aneurysm Maternal Grandfather   ? Heart attack Paternal Grandmother   ? Heart disease Paternal Grandmother   ?     MI  ? Alzheimer's disease Paternal Grandfather   ? Dementia Paternal Grandfather   ? Heart attack Paternal Aunt   ? Heart disease Paternal Aunt   ?      AAA rupture  ? Other Paternal Uncle   ?     cerebreal brain hemorrhage  ? Stroke Paternal Uncle   ? Aneurysm Paternal Uncle   ? Asthma Daughter   ? Other Daughter   ?     peanut allergy  ? Heart disease Maternal Aunt   ?     s/p cabg. brain aneurysm s/p coiling  ? Aneurysm Maternal Aunt   ?     coil behind eye  ? ?Allergies as of 02/12/2022   ? ?   Reactions  ? Topamax [topiramate]   ? dizziness  ? Zonegran [zonisamide]   ? dizziness  ? ?  ? ?  ?Medication List  ?  ? ?  ? Accurate as of February 12, 2022 11:59 PM. If you have any questions, ask your nurse or doctor.  ?  ?  ? ?  ? ?albuterol 108 (90 Base) MCG/ACT inhaler ?Commonly known as: VENTOLIN HFA ?Inhale 2 puffs into the lungs every 6 (six) hours as needed for wheezing or shortness of breath. ?Started by: Howard Pouch, DO ?  ?amLODipine 5 MG tablet ?Commonly known as: NORVASC ?TAKE 1 TABLET(5 MG) BY MOUTH DAILY ?  ?Budesonide 90 MCG/ACT inhaler ?Inhale 1 puff into the lungs 2 (two) times daily. ?Started by: Howard Pouch, DO ?  ?meclizine 25 MG tablet ?Commonly known  as: ANTIVERT ?Take by mouth. ?  ?Multi-Vitamins Tabs ?Take by mouth. ?  ?sertraline 50 MG tablet ?Commonly known as: ZOLOFT ?Take 1 tablet (50 mg total) by mouth daily. ?  ? ?  ? ? ?All past medical history, surgical history, allergies, family history, immunizations andmedications were updated in  the EMR today and reviewed under the history and medication portions of their EMR.    ? ?ROS ?Negative, with the exception of above mentioned in HPI ? ? ?Objective:  ?BP 116/74   Pulse 75   Temp (!) 97.3 ?F (36.3 ?C) (Rectal)   Ht 5' 2.25" (1.581 m)   Wt 220 lb (99.8 kg)   SpO2 99%   BMI 39.92 kg/m?  ?Body mass index is 39.92 kg/m?Marland Kitchen ?Physical Exam ?Vitals and nursing note reviewed.  ?Constitutional:   ?   General: She is not in acute distress. ?   Appearance: Normal appearance. She is normal weight. She is not ill-appearing or toxic-appearing.  ?HENT:  ?   Right Ear: Tympanic membrane normal.  ?   Left Ear: Tympanic membrane normal.  ?   Nose: No congestion or rhinorrhea.  ?   Mouth/Throat:  ?   Pharynx: No posterior oropharyngeal erythema.  ?Eyes:  ?   Extraocular Movements: Extraocular movements intact.  ?   Conjunctiva/sclera: Conjunctivae normal.  ?   Pupils: Pupils are equal, round, and reactive to light.  ?Cardiovascular:  ?   Rate and Rhythm: Normal rate and regular rhythm.  ?   Heart sounds: No murmur heard. ?Pulmonary:  ?   Effort: Pulmonary effort is normal. No respiratory distress.  ?   Breath sounds: No wheezing, rhonchi or rales.  ?   Comments: Mild decrease in air movement with inspiration. ?Musculoskeletal:  ?   Right lower leg: No edema.  ?   Left lower leg: No edema.  ?Skin: ?   Findings: No rash.  ?Neurological:  ?   Mental Status: She is alert and oriented to person, place, and time. Mental status is at baseline.  ?Psychiatric:     ?   Mood and Affect: Mood normal.     ?   Behavior: Behavior normal.     ?   Thought Content: Thought content normal.     ?   Judgment: Judgment normal.  ? ? ? ?No results found. ?No results found. ?No results found for this or any previous visit (from the past 24 hour(s)). ? ?Assessment/Plan: ?JOVONNE WILTON is a 46 y.o. female present for OV for  ?Mild reactive airway with acute exacerbation/seasonal allergies: ?Her explanation of her symptoms is more  consistent with reactive airway/seasonal allergies versus anxiety.  However, it is difficult for her to distinguish between the two. ?Provided an albuterol treatment in the office today and air movement improved a good deal i

## 2022-02-26 ENCOUNTER — Ambulatory Visit (INDEPENDENT_AMBULATORY_CARE_PROVIDER_SITE_OTHER): Payer: 59

## 2022-02-26 ENCOUNTER — Encounter: Payer: Self-pay | Admitting: Pulmonary Disease

## 2022-02-26 ENCOUNTER — Ambulatory Visit: Payer: 59 | Admitting: Pulmonary Disease

## 2022-02-26 VITALS — BP 124/76 | HR 72 | Temp 97.9°F | Ht 65.0 in | Wt 222.0 lb

## 2022-02-26 DIAGNOSIS — G4733 Obstructive sleep apnea (adult) (pediatric): Secondary | ICD-10-CM | POA: Diagnosis not present

## 2022-02-26 DIAGNOSIS — R0609 Other forms of dyspnea: Secondary | ICD-10-CM | POA: Diagnosis not present

## 2022-02-26 MED ORDER — BUDESONIDE-FORMOTEROL FUMARATE 160-4.5 MCG/ACT IN AERO: 2.0000 | INHALATION_SPRAY | Freq: Two times a day (BID) | RESPIRATORY_TRACT | 11 refills | Status: DC

## 2022-02-26 NOTE — Patient Instructions (Addendum)
Nice to meet you ? ?We will get a chest x-ray today ? ?We will get pulmonary function test at your next visit for further information about your symptoms ? ?I have ordered a heart ultrasound to further evaluate some changes that were seen in 2018 ? ?I ordered a repeat home sleep test to get you back on CPAP assuming the test demonstrates sleep apnea  ? ?For now, continue to use your budesonide inhaler 2 puffs twice a day every day.  I have prescribed a new medicine, Symbicort.  Use the Symbicort 2 puffs twice a day every day.  Rinse your mouth out after every use of both of these medications.  Once you get the Symbicort, please stop the budesonide. ? ?Continue albuterol every 6 hours as needed.  Rinse your mouth out after every use. ? ?Return to clinic in 4 weeks same day of PFT, after PFT so we can discuss results ?

## 2022-02-27 NOTE — Progress Notes (Signed)
? ?@Patient  ID: Erika Weaver, female    DOB: October 27, 1975, 46 y.o.   MRN: [id number] ? ?Chief Complaint  ?Patient presents with  ? Consult  ?  Pt states she has been having SOB for about over a month. Pt states that nothing that she knows triggered this. She went to PCP and it hasn't gotten any better. Pt states that she does have sleep apnea.   ? ? ?Referring provider: ?Raoul Pitch, Renee A, DO ? ?HPI:  ? ?46 y.o. woman whom we are seeing in consultation for evaluation of dyspnea on exertion.  Most recent PCP note reviewed. ? ?Patient notes several week history of dyspnea on exertion.  Worse on inclines or stairs.  Notable on flat surfaces as well.  No time of day when things are better or worse.  No seasonal environmental factors she can identify to make things better or worse.  She reports a history of prednisone with improvement in symptoms although fleeting.  Placed on inhalers recently without much benefit.  ICS as well as as needed [place].  She notes associated cough over the last week or 2 since starting inhalers.  Dry.  No other alleviating or bad exacerbating factors. ? ?She does have a history of seasonal allergies.  Patient is to take antihistamines.  Denies history of recurrent bronchitis.  No history of asthma as a child.  Sister had asthma when a child.  No chest imaging to review.  She does not think she has had chest imaging since onset of symptoms a few weeks ago.  Reviewed TTE 2018 that showed diastolic function, RV dysfunction etc. as outlined below. ? ?PMH: Seasonal allergies, hypertension, ?Surgical history: Rectal fistula repair, bowel surgery ?Family history: Mother with diabetes, hypertension, hyperlipidemia, father with hypertension, hyperlipidemia ?Social history: Never smoker, lives in [place], works at preschool at [employer] ? ?Questionaires / Pulmonary Flowsheets:  ? ?ACT:  ?   ? View : No data to display.  ?  ?  ?  ? ? ?MMRC: ?   ? View : No data to display.  ?  ?  ?   ? ? ?Epworth:  ?   ? View : No data to display.  ?  ?  ?  ? ? ?Tests:  ? ?FENO:  ?No results found for: NITRICOXIDE ? ?PFT: ?   ? View : No data to display.  ?  ?  ?  ? ? ?WALK:  ?   ? View : No data to display.  ?  ?  ?  ? ? ?Imaging: ?No results found. ? ?Lab Results: ?Personally reviewed ?CBC  ?   ?Component Value Date/Time  ? WBC 9.8 05/11/2021 0928  ? WBC 11.9 (H) 06/08/2019 1312  ? RBC 4.52 05/11/2021 0928  ? HGB 13.8 11/30/2021 0000  ? HGB 14.2 05/11/2021 0928  ? HGB 14.1 07/04/2017 0941  ? HCT 41.5 05/11/2021 0928  ? HCT 41.4 07/04/2017 0941  ? PLT 299 05/11/2021 0928  ? PLT 303 07/04/2017 0941  ? MCV 91.8 05/11/2021 0928  ? MCV 92 07/04/2017 0941  ? MCH 31.4 05/11/2021 0928  ? MCHC 34.2 05/11/2021 0928  ? RDW 12.5 05/11/2021 0928  ? RDW 12.3 07/04/2017 0941  ? LYMPHSABS 2.6 05/11/2021 0928  ? LYMPHSABS 2.6 07/04/2017 0941  ? MONOABS 0.8 05/11/2021 0928  ? EOSABS 0.2 05/11/2021 0928  ? EOSABS 0.1 07/04/2017 0941  ? BASOSABS 0.1 05/11/2021 0928  ? BASOSABS 0.0 07/04/2017 0941  ? ? ?BMET ?   ?  Component Value Date/Time  ? NA 136 05/11/2021 0928  ? K 4.4 05/11/2021 0928  ? CL 101 05/11/2021 0928  ? CO2 30 05/11/2021 0928  ? GLUCOSE 100 (H) 05/11/2021 2423  ? BUN 14 05/11/2021 0928  ? CREATININE 0.76 05/11/2021 0928  ? CREATININE 0.72 05/27/2014 0944  ? CALCIUM 9.7 05/11/2021 0928  ? GFRNONAA >60 05/11/2021 0928  ? GFRAA >60 05/07/2019 0956  ? ? ?BNP ?No results found for: BNP ? ?ProBNP ?No results found for: PROBNP ? ?Specialty Problems   ? ?  ? Pulmonary Problems  ? Allergic rhinitis  ?  Formatting of this note might be different from the original. ?Allergic Rhinitis ? ?10/1 IMO update ? ?  ?  ? Sleep apnea  ? ? ?Allergies  ?Allergen Reactions  ? Topamax [Topiramate]   ?  dizziness  ? Zonegran [Zonisamide]   ?  dizziness  ? ? ?Immunization History  ?Administered Date(s) Administered  ? Influenza Split 07/22/2009  ? Influenza Whole 07/23/2011  ? Influenza,inj,Quad PF,6+ Mos 08/18/2013, 12/09/2014, 09/22/2015,  08/30/2016, 09/05/2017, 07/31/2018, 08/25/2019, 09/08/2020, 11/15/2021  ? Influenza,inj,quad, With Preservative 08/22/2017  ? Influenza-Unspecified 07/27/2011  ? PFIZER(Purple Top)SARS-COV-2 Vaccination 07/01/2020, 07/22/2020  ? Tdap 12/16/2009, 08/18/2013  ? ? ?Past Medical History:  ?Diagnosis Date  ? Allergic state 11/22/2011  ? Anxiety 11/22/2011  ? Back pain 01/31/2013  ? Bartholin cyst 03/01/2013  ? BPV (benign positional vertigo) 08/23/2013  ? Chicken pox as a child  ? Headache 11/22/2011  ? Overview:  Headache  ICD-10 cut over    ? Hypertension   ? Kidney stone 12/21/2012  ? Other and unspecified hyperlipidemia 03/07/2014  ? Plantar fasciitis 01/11/2012  ? Rectal vaginal fistula 01/31/2013  ? Retrosternal chest pain 11/08/2015  ? Sinusitis 11/22/2011  ? Sleep apnea 04/03/2012  ? setting 2  ? Visual changes 11/22/2011  ? ? ?Tobacco History: ?Social History  ? ?Tobacco Use  ?Smoking Status Never  ?Smokeless Tobacco Never  ? ?Counseling given: Not Answered ? ? ?Continue to not smoke ? ?Outpatient Encounter Medications as of 02/26/2022  ?Medication Sig  ? albuterol (VENTOLIN HFA) 108 (90 Base) MCG/ACT inhaler Inhale 2 puffs into the lungs every 6 (six) hours as needed for wheezing or shortness of breath.  ? amLODipine (NORVASC) 5 MG tablet TAKE 1 TABLET(5 MG) BY MOUTH DAILY  ? budesonide-formoterol (SYMBICORT) 160-4.5 MCG/ACT inhaler Inhale 2 puffs into the lungs in the morning and at bedtime.  ? meclizine (ANTIVERT) 25 MG tablet Take by mouth.  ? Multiple Vitamin (MULTI-VITAMINS) TABS Take by mouth.  ? sertraline (ZOLOFT) 50 MG tablet Take 1 tablet (50 mg total) by mouth daily.  ? valACYclovir (VALTREX) 500 MG tablet Take by mouth.  ? Budesonide 90 MCG/ACT inhaler Inhale 1 puff into the lungs 2 (two) times daily. (Patient not taking: Reported on 02/26/2022)  ? ?No facility-administered encounter medications on file as of 02/26/2022.  ? ? ? ?Review of Systems ? ?Review of Systems  ?No chest pain with exertion.  No orthopnea or PND.   No lower extremity swelling.  Comprehensive review of systems otherwise negative. ?Physical Exam ? ?BP 124/76 (BP Location: Left Arm, Patient Position: Sitting, Cuff Size: Normal)   Pulse 72   Temp 97.9 ?F (36.6 ?C) (Oral)   Ht 5' 5"  (1.651 m)   Wt 222 lb (100.7 kg)   SpO2 100%   BMI 36.94 kg/m?  ? ?Wt Readings from Last 5 Encounters:  ?02/26/22 222 lb (100.7 kg)  ?02/12/22 220 lb (  99.8 kg)  ?11/15/21 219 lb (99.3 kg)  ?05/11/21 225 lb (102.1 kg)  ?09/08/20 223 lb (101.2 kg)  ? ? ?BMI Readings from Last 5 Encounters:  ?02/26/22 36.94 kg/m?  ?02/12/22 39.92 kg/m?  ?11/15/21 36.16 kg/m?  ?05/11/21 37.16 kg/m?  ?09/08/20 36.83 kg/m?  ? ? ? ?Physical Exam ?General: Well-appearing, no acute distress ?Eyes: EOMI, icterus ?Neck: Supple, no JVP appreciated sitting upright ?Cardiovascular: Regular rhythm, no murmur ?Pulmonary: Clear, normal work of breathing, good air movement ?Abdomen: Nondistended, bowel sounds present ?MSK: No synovitis, joint effusion ?Neuro: Normal gait, no weakness ?Psych: Normal mood, full affect ? ? ?Assessment & Plan:  ? ?Dyspnea on exertion: Suspect multifactorial and related to deconditioning, concern for cardiac contribution given history of LVH, diastolic dysfunction, RV dysfunction on TTE 2018, with atopic symptoms concern for asthma as well given relatively sudden onset.  No chest imaging that I can review, chest x-ray today.  PFTs ordered for further evaluation.  Echocardiogram ordered for further evaluation. ? ?Asthma: Based on history of atopy, relatively sudden onset of symptoms, temporary improvement with prior prednisone course.  Had been prescribed budesonide a week or 2 ago but on review of inhaler technique was not using correctly.  She was educated on correct inhaler technique.  Escalate to high-dose Symbicort 2 puffs twice a day.  Continue albuterol as needed. ? ? ?Return in about 4 weeks (around 03/26/2022). ? ? ?Lanier Clam, MD ?02/27/2022 ? ? ? ?

## 2022-02-27 NOTE — Progress Notes (Signed)
Lungs are clear, this is good news.

## 2022-02-28 ENCOUNTER — Telehealth: Payer: Self-pay | Admitting: Pulmonary Disease

## 2022-02-28 NOTE — Telephone Encounter (Signed)
Called patient back and informed her of her chest xray results. No questions noted from patient. Nothing further needed  ?

## 2022-03-27 ENCOUNTER — Ambulatory Visit (HOSPITAL_COMMUNITY): Payer: 59 | Attending: Internal Medicine

## 2022-03-27 DIAGNOSIS — R0609 Other forms of dyspnea: Secondary | ICD-10-CM

## 2022-03-27 LAB — ECHOCARDIOGRAM COMPLETE
AR max vel: 2.35 cm2
AV Peak grad: 9.1 mmHg
Ao pk vel: 1.51 m/s
Area-P 1/2: 3.24 cm2
S' Lateral: 2.4 cm

## 2022-03-28 NOTE — Progress Notes (Signed)
Echocardiogram is largely normal which is good!

## 2022-04-09 ENCOUNTER — Ambulatory Visit (INDEPENDENT_AMBULATORY_CARE_PROVIDER_SITE_OTHER): Payer: 59 | Admitting: Pulmonary Disease

## 2022-04-09 ENCOUNTER — Encounter: Payer: Self-pay | Admitting: Pulmonary Disease

## 2022-04-09 VITALS — BP 124/68 | HR 85 | Ht 66.0 in | Wt 222.8 lb

## 2022-04-09 DIAGNOSIS — J4520 Mild intermittent asthma, uncomplicated: Secondary | ICD-10-CM

## 2022-04-09 DIAGNOSIS — R0609 Other forms of dyspnea: Secondary | ICD-10-CM | POA: Diagnosis not present

## 2022-04-09 LAB — PULMONARY FUNCTION TEST
DL/VA % pred: 129 %
DL/VA: 5.53 ml/min/mmHg/L
DLCO cor % pred: 132 %
DLCO cor: 30.12 ml/min/mmHg
DLCO unc % pred: 132 %
DLCO unc: 30.12 ml/min/mmHg
FEF 25-75 Post: 4.06 L/sec
FEF 25-75 Pre: 4.21 L/sec
FEF2575-%Change-Post: -3 %
FEF2575-%Pred-Post: 135 %
FEF2575-%Pred-Pre: 140 %
FEV1-%Change-Post: 0 %
FEV1-%Pred-Post: 110 %
FEV1-%Pred-Pre: 110 %
FEV1-Post: 3.39 L
FEV1-Pre: 3.4 L
FEV1FVC-%Change-Post: 0 %
FEV1FVC-%Pred-Pre: 104 %
FEV6-%Change-Post: 0 %
FEV6-%Pred-Post: 106 %
FEV6-%Pred-Pre: 106 %
FEV6-Post: 3.98 L
FEV6-Pre: 4 L
FEV6FVC-%Pred-Post: 102 %
FEV6FVC-%Pred-Pre: 102 %
FVC-%Change-Post: 0 %
FVC-%Pred-Post: 103 %
FVC-%Pred-Pre: 104 %
FVC-Post: 3.98 L
FVC-Pre: 4.01 L
Post FEV1/FVC ratio: 85 %
Post FEV6/FVC ratio: 100 %
Pre FEV1/FVC ratio: 85 %
Pre FEV6/FVC Ratio: 100 %
RV % pred: 102 %
RV: 1.88 L
TLC % pred: 110 %
TLC: 5.92 L

## 2022-04-09 NOTE — Progress Notes (Signed)
PFT done today. 

## 2022-04-09 NOTE — Progress Notes (Signed)
@Patient  ID: Erika Weaver, female    DOB: 1976-09-05, 46 y.o.   MRN: [id number]  Chief Complaint  Patient presents with   Follow-up    Pt is here for follow up for DOE. Pt was confused about her inhaler and was only doing it as needed and not daily. Pt had full PFTs done in office today     Referring provider: Ma Hillock, DO  HPI:   46 y.o. woman whom we are seeing in follow up for evaluation of dyspnea on exertion.    Overall doing well.  Dyspnea much improved.  Using Symbicort intermittently.  Not quite as needed.  Hard remember to use twice a day.  Using intermittently.  Feels better after starting this medication.  Reviewed echocardiogram ordered last visit was normal.  Pulmonary function test performed today.  On my review interpretation is as normal with elevated DLCO that could be indicative of asthma.   HPI at initial visit: Patient notes several week history of dyspnea on exertion.  Worse on inclines or stairs.  Notable on flat surfaces as well.  No time of day when things are better or worse.  No seasonal environmental factors she can identify to make things better or worse.  She reports a history of prednisone with improvement in symptoms although fleeting.  Placed on inhalers recently without much benefit.  ICS as well as as needed [place].  She notes associated cough over the last week or 2 since starting inhalers.  Dry.  No other alleviating or bad exacerbating factors.  She does have a history of seasonal allergies.  Patient is to take antihistamines.  Denies history of recurrent bronchitis.  No history of asthma as a child.  Sister had asthma when a child.  No chest imaging to review.  She does not think she has had chest imaging since onset of symptoms a few weeks ago.  Reviewed TTE 2018 that showed diastolic function, RV dysfunction etc. as outlined below.  PMH: Seasonal allergies, hypertension, Surgical history: Rectal fistula repair, bowel surgery Family history:  Mother with diabetes, hypertension, hyperlipidemia, father with hypertension, hyperlipidemia Social history: Never smoker, lives in [place], works at preschool at [employer] / Pulmonary Flowsheets:   ACT:      No data to display           MMRC:     No data to display           Epworth:      No data to display           Tests:   FENO:  No results found for: "NITRICOXIDE"  PFT:    Latest Ref Rng & Units 04/09/2022    3:03 PM  PFT Results  FVC-Pre L 4.01  P  FVC-Predicted Pre % 104  P  FVC-Post L 3.98  P  FVC-Predicted Post % 103  P  Pre FEV1/FVC % % 85  P  Post FEV1/FCV % % 85  P  FEV1-Pre L 3.40  P  FEV1-Predicted Pre % 110  P  FEV1-Post L 3.39  P  DLCO uncorrected ml/min/mmHg 30.12  P  DLCO UNC% % 132  P  DLCO corrected ml/min/mmHg 30.12  P  DLCO COR %Predicted % 132  P  DLVA Predicted % 129  P  TLC L 5.92  P  TLC % Predicted % 110  P  RV % Predicted % 102  P    P Preliminary result  Personally reviewed and interpreted as normal spirometry, no bronchodilator spots, normal lung volumes, DLCO elevated this can be seen in asthma   WALK:      No data to display           Imaging: ECHOCARDIOGRAM COMPLETE  Result Date: 03/27/2022    ECHOCARDIOGRAM REPORT   Patient Name:   Erika Weaver Date of Exam: 03/27/2022 Medical Rec #:  [id number]         Height:       65.0 in Accession #:    [id number]        Weight:       222.0 lb Date of Birth:  05-Jul-1976         BSA:          2.068 m Patient Age:    29 years          BP:           126/76 mmHg Patient Gender: F                 HR:           68 bpm. Exam Location:  [facility] Procedure: 2D Echo, Cardiac Doppler and Color Doppler Indications:    Dyspnea  History:        Patient has prior history of Echocardiogram examinations, most                 recent 01/31/2017. Risk Factors:Hypertension and Sleep Apnea.  Sonographer:    Jefferey Pica Referring Phys: [place]  1. Left ventricular ejection fraction, by estimation, is 60 to 65%. The left ventricle has normal function. The left ventricle has no regional wall motion abnormalities. There is moderate asymmetric left ventricular hypertrophy of the septal segment. Left ventricular diastolic parameters are consistent with Grade I diastolic dysfunction (impaired relaxation).  2. Right ventricular systolic function is normal. The right ventricular size is normal.  3. The mitral valve is normal in structure. No evidence of mitral valve regurgitation. No evidence of mitral stenosis.  4. The aortic valve is tricuspid. Aortic valve regurgitation is not visualized. No aortic stenosis is present.  5. The inferior vena cava is normal in size with greater than 50% respiratory variability, suggesting right atrial pressure of 3 mmHg.  6. Technically difficult study: Off Axis A3C view. Comparison(s): RV function improved from prior report; unable to load images. FINDINGS  Left Ventricle: Left ventricular ejection fraction, by estimation, is 60 to 65%. The left ventricle has normal function. The left ventricle has no regional wall motion abnormalities. The left ventricular internal cavity size was small. There is moderate  asymmetric left ventricular hypertrophy of the septal segment. Left ventricular diastolic parameters are consistent with Grade I diastolic dysfunction (impaired relaxation). Right Ventricle: The right ventricular size is normal. No increase in right ventricular wall thickness. Right ventricular systolic function is normal. Left Atrium: Left atrial size was normal in size. Right Atrium: Right atrial size was normal in size. Pericardium: There is no evidence of pericardial effusion. Mitral Valve: The mitral valve is normal in structure. No evidence of mitral valve regurgitation. No evidence of mitral valve stenosis. Tricuspid Valve: The tricuspid valve is normal in structure. Tricuspid valve  regurgitation is not demonstrated. No evidence of tricuspid stenosis. Aortic Valve: The aortic valve is tricuspid. Aortic valve regurgitation is not visualized. No aortic stenosis is present. Aortic valve peak gradient measures 9.1 mmHg. Pulmonic Valve: The pulmonic valve was normal  in structure. Pulmonic valve regurgitation is not visualized. No evidence of pulmonic stenosis. Aorta: The aortic root and ascending aorta are structurally normal, with no evidence of dilitation. Venous: The inferior vena cava is normal in size with greater than 50% respiratory variability, suggesting right atrial pressure of 3 mmHg. IAS/Shunts: No atrial level shunt detected by color flow Doppler.  LEFT VENTRICLE PLAX 2D LVIDd:         3.70 cm   Diastology LVIDs:         2.40 cm   LV e' medial:    5.48 cm/s LV PW:         1.10 cm   LV E/e' medial:  11.2 LV IVS:        1.30 cm   LV e' lateral:   13.30 cm/s LVOT diam:     1.90 cm   LV E/e' lateral: 4.6 LV SV:         70 LV SV Index:   34 LVOT Area:     2.84 cm  RIGHT VENTRICLE             IVC RV Basal diam:  2.50 cm     IVC diam: 2.00 cm RV S prime:     12.30 cm/s TAPSE (M-mode): 1.7 cm LEFT ATRIUM             Index        RIGHT ATRIUM           Index LA diam:        3.80 cm 1.84 cm/m   RA Area:     14.30 cm LA Vol (A2C):   38.8 ml 18.76 ml/m  RA Volume:   31.10 ml  15.04 ml/m LA Vol (A4C):   42.8 ml 20.70 ml/m LA Biplane Vol: 40.6 ml 19.63 ml/m  AORTIC VALVE                 PULMONIC VALVE AV Area (Vmax): 2.35 cm     PV Vmax:       0.89 m/s AV Vmax:        151.00 cm/s  PV Peak grad:  3.1 mmHg AV Peak Grad:   9.1 mmHg LVOT Vmax:      125.00 cm/s LVOT Vmean:     83.300 cm/s LVOT VTI:       0.247 m  AORTA Ao Root diam: 3.20 cm Ao Asc diam:  3.10 cm MITRAL VALVE MV Area (PHT): 3.24 cm    SHUNTS MV Decel Time: 234 msec    Systemic VTI:  0.25 m MV E velocity: 61.30 cm/s  Systemic Diam: 1.90 cm MV A velocity: 78.60 cm/s MV E/A ratio:  0.78 Rudean Haskell MD Electronically signed  by Rudean Haskell MD Signature Date/Time: 03/27/2022/7:14:31 PM    Final     Lab Results: Personally reviewed CBC     Component Value Date/Time   WBC 9.8 05/11/2021 0928   WBC 11.9 (H) 06/08/2019 1312   RBC 4.52 05/11/2021 0928   HGB 13.8 11/30/2021 0000   HGB 14.2 05/11/2021 0928   HGB 14.1 07/04/2017 0941   HCT 41.5 05/11/2021 0928   HCT 41.4 07/04/2017 0941   PLT 299 05/11/2021 0928   PLT 303 07/04/2017 0941   MCV 91.8 05/11/2021 0928   MCV 92 07/04/2017 0941   MCH 31.4 05/11/2021 0928   MCHC 34.2 05/11/2021 0928   RDW 12.5 05/11/2021 0928   RDW 12.3 07/04/2017 0941   LYMPHSABS 2.6 05/11/2021 8101  LYMPHSABS 2.6 07/04/2017 0941   MONOABS 0.8 05/11/2021 0928   EOSABS 0.2 05/11/2021 0928   EOSABS 0.1 07/04/2017 0941   BASOSABS 0.1 05/11/2021 0928   BASOSABS 0.0 07/04/2017 0941    BMET    Component Value Date/Time   NA 136 05/11/2021 0928   K 4.4 05/11/2021 0928   CL 101 05/11/2021 0928   CO2 30 05/11/2021 0928   GLUCOSE 100 (H) 05/11/2021 0928   BUN 14 05/11/2021 0928   CREATININE 0.76 05/11/2021 0928   CREATININE 0.72 05/27/2014 0944   CALCIUM 9.7 05/11/2021 0928   GFRNONAA >60 05/11/2021 0928   GFRAA >60 05/07/2019 0956    BNP No results found for: "BNP"  ProBNP No results found for: "PROBNP"  Specialty Problems       Pulmonary Problems   Allergic rhinitis    Formatting of this note might be different from the original. Allergic Rhinitis  10/1 IMO update      Sleep apnea    Allergies  Allergen Reactions   Topamax [Topiramate]     dizziness   Zonegran [Zonisamide]     dizziness    Immunization History  Administered Date(s) Administered   Influenza Split 07/22/2009   Influenza Whole 07/23/2011   Influenza,inj,Quad PF,6+ Mos 08/18/2013, 12/09/2014, 09/22/2015, 08/30/2016, 09/05/2017, 07/31/2018, 08/25/2019, 09/08/2020, 11/15/2021   Influenza,inj,quad, With Preservative 08/22/2017   Influenza-Unspecified 07/27/2011   PFIZER(Purple  Top)SARS-COV-2 Vaccination 07/01/2020, 07/22/2020   Tdap 12/16/2009, 08/18/2013    Past Medical History:  Diagnosis Date   Allergic state 11/22/2011   Anxiety 11/22/2011   Back pain 01/31/2013   Bartholin cyst 03/01/2013   BPV (benign positional vertigo) 08/23/2013   Chicken pox as a child   Headache 11/22/2011   Overview:  Headache  ICD-10 cut over     Hypertension    Kidney stone 12/21/2012   Other and unspecified hyperlipidemia 03/07/2014   Plantar fasciitis 01/11/2012   Rectal vaginal fistula 01/31/2013   Retrosternal chest pain 11/08/2015   Sinusitis 11/22/2011   Sleep apnea 04/03/2012   setting 2   Visual changes 11/22/2011    Tobacco History: Social History   Tobacco Use  Smoking Status Never  Smokeless Tobacco Never   Counseling given: Not Answered   Continue to not smoke  Outpatient Encounter Medications as of 04/09/2022  Medication Sig   albuterol (VENTOLIN HFA) 108 (90 Base) MCG/ACT inhaler Inhale 2 puffs into the lungs every 6 (six) hours as needed for wheezing or shortness of breath.   amLODipine (NORVASC) 5 MG tablet TAKE 1 TABLET(5 MG) BY MOUTH DAILY   budesonide-formoterol (SYMBICORT) 160-4.5 MCG/ACT inhaler Inhale 2 puffs into the lungs in the morning and at bedtime.   meclizine (ANTIVERT) 25 MG tablet Take by mouth.   Multiple Vitamin (MULTI-VITAMINS) TABS Take by mouth.   sertraline (ZOLOFT) 50 MG tablet Take 1 tablet (50 mg total) by mouth daily.   valACYclovir (VALTREX) 500 MG tablet Take by mouth.   [DISCONTINUED] Budesonide 90 MCG/ACT inhaler Inhale 1 puff into the lungs 2 (two) times daily. (Patient not taking: Reported on 02/26/2022)   No facility-administered encounter medications on file as of 04/09/2022.     Review of Systems  Review of Systems  N/a Physical Exam  BP 124/68 (BP Location: Right Arm, Patient Position: Sitting, Cuff Size: Normal)   Pulse 85   Ht 5' 6"  (1.676 m)   Wt 222 lb 12.8 oz (101.1 kg)   SpO2 97%   BMI 35.96 kg/m   Wt  Readings  from Last 5 Encounters:  04/09/22 222 lb 12.8 oz (101.1 kg)  02/26/22 222 lb (100.7 kg)  02/12/22 220 lb (99.8 kg)  11/15/21 219 lb (99.3 kg)  05/11/21 225 lb (102.1 kg)    BMI Readings from Last 5 Encounters:  04/09/22 35.96 kg/m  02/26/22 36.94 kg/m  02/12/22 39.92 kg/m  11/15/21 36.16 kg/m  05/11/21 37.16 kg/m     Physical Exam General: Well-appearing, no acute distress Eyes: EOMI, icterus Neck: Supple, no JVP appreciated sitting upright Cardiovascular: Regular rhythm, no murmur Pulmonary: Clear, normal work of breathing, good air movement Abdomen: Nondistended, bowel sounds present MSK: No synovitis, joint effusion Neuro: Normal gait, no weakness Psych: Normal mood, full affect   Assessment & Plan:   Dyspnea on exertion: Suspect multifactorial and related to deconditioning, concern for cardiac contribution given history of LVH, diastolic dysfunction, RV dysfunction on TTE 2018, with atopic symptoms concern for asthma as well given relatively sudden onset.  Chest x-ray clear.  Echocardiogram normal.  PFTs normal.  Improved with Symbicort.  Asthma: Based on history of atopy, relatively sudden onset of symptoms, temporary improvement with prior prednisone course.  Improved with Symbicort.  Using intermittently.  Continue Symbicort, encouraged to try 2 puffs twice a day every day.  Continue albuterol as needed.  Return in about 6 months (around 10/09/2022).   Lanier Clam, MD 04/09/2022

## 2022-04-09 NOTE — Patient Instructions (Addendum)
Nice to see you again  I am glad you are feeling better  Consider using the Symbicort 2 puffs twice a day every day for about 4 weeks.  See if it makes things any better.  It may not.    Continue albuterol as needed  Return to clinic in 6 months or sooner as needed with Dr. Silas Flood

## 2022-05-03 ENCOUNTER — Encounter: Payer: 59 | Admitting: Family Medicine

## 2022-05-16 ENCOUNTER — Other Ambulatory Visit: Payer: Self-pay | Admitting: Family Medicine

## 2022-05-16 DIAGNOSIS — Z1231 Encounter for screening mammogram for malignant neoplasm of breast: Secondary | ICD-10-CM

## 2022-05-21 ENCOUNTER — Encounter: Payer: Self-pay | Admitting: Family Medicine

## 2022-05-21 ENCOUNTER — Ambulatory Visit (INDEPENDENT_AMBULATORY_CARE_PROVIDER_SITE_OTHER): Payer: 59 | Admitting: Family Medicine

## 2022-05-21 VITALS — BP 118/74 | HR 75 | Temp 98.0°F | Ht 65.25 in | Wt 221.0 lb

## 2022-05-21 DIAGNOSIS — I10 Essential (primary) hypertension: Secondary | ICD-10-CM

## 2022-05-21 DIAGNOSIS — F419 Anxiety disorder, unspecified: Secondary | ICD-10-CM | POA: Diagnosis not present

## 2022-05-21 DIAGNOSIS — D72823 Leukemoid reaction: Secondary | ICD-10-CM

## 2022-05-21 DIAGNOSIS — Z0001 Encounter for general adult medical examination with abnormal findings: Secondary | ICD-10-CM | POA: Diagnosis not present

## 2022-05-21 DIAGNOSIS — E782 Mixed hyperlipidemia: Secondary | ICD-10-CM

## 2022-05-21 DIAGNOSIS — H8110 Benign paroxysmal vertigo, unspecified ear: Secondary | ICD-10-CM

## 2022-05-21 DIAGNOSIS — Z79899 Other long term (current) drug therapy: Secondary | ICD-10-CM

## 2022-05-21 DIAGNOSIS — E559 Vitamin D deficiency, unspecified: Secondary | ICD-10-CM

## 2022-05-21 LAB — TSH: TSH: 1.81 u[IU]/mL (ref 0.35–5.50)

## 2022-05-21 LAB — CBC
HCT: 40.7 % (ref 36.0–46.0)
Hemoglobin: 13.5 g/dL (ref 12.0–15.0)
MCHC: 33.2 g/dL (ref 30.0–36.0)
MCV: 92.7 fl (ref 78.0–100.0)
Platelets: 250 10*3/uL (ref 150.0–400.0)
RBC: 4.38 Mil/uL (ref 3.87–5.11)
RDW: 13.2 % (ref 11.5–15.5)
WBC: 8.4 10*3/uL (ref 4.0–10.5)

## 2022-05-21 LAB — VITAMIN D 25 HYDROXY (VIT D DEFICIENCY, FRACTURES): VITD: 13.45 ng/mL — ABNORMAL LOW (ref 30.00–100.00)

## 2022-05-21 LAB — HEMOGLOBIN A1C: Hgb A1c MFr Bld: 5.7 % (ref 4.6–6.5)

## 2022-05-21 MED ORDER — AMLODIPINE BESYLATE 5 MG PO TABS: ORAL_TABLET | ORAL | 1 refills | Status: DC

## 2022-05-21 MED ORDER — SERTRALINE HCL 50 MG PO TABS: 50.0000 mg | ORAL_TABLET | Freq: Every day | ORAL | 1 refills | Status: DC

## 2022-05-21 NOTE — Patient Instructions (Signed)
No follow-ups on file.        Great to see you today.  I have refilled the medication(s) we provide.   If labs were collected, we will inform you of lab results once received either by echart message or telephone call.   - echart message- for normal results that have been seen by the patient already.   - telephone call: abnormal results or if patient has not viewed results in their echart.  Health Maintenance, Female Adopting a healthy lifestyle and getting preventive care are important in promoting health and wellness. Ask your health care provider about: The right schedule for you to have regular tests and exams. Things you can do on your own to prevent diseases and keep yourself healthy. What should I know about diet, weight, and exercise? Eat a healthy diet  Eat a diet that includes plenty of vegetables, fruits, low-fat dairy products, and lean protein. Do not eat a lot of foods that are high in solid fats, added sugars, or sodium. Maintain a healthy weight Body mass index (BMI) is used to identify weight problems. It estimates body fat based on height and weight. Your health care provider can help determine your BMI and help you achieve or maintain a healthy weight. Get regular exercise Get regular exercise. This is one of the most important things you can do for your health. Most adults should: Exercise for at least 150 minutes each week. The exercise should increase your heart rate and make you sweat (moderate-intensity exercise). Do strengthening exercises at least twice a week. This is in addition to the moderate-intensity exercise. Spend less time sitting. Even light physical activity can be beneficial. Watch cholesterol and blood lipids Have your blood tested for lipids and cholesterol at 48 years of age, then have this test every 5 years. Have your cholesterol levels checked more often if: Your lipid or cholesterol levels are high. You are older than 48 years of  age. You are at high risk for heart disease. What should I know about cancer screening? Depending on your health history and family history, you may need to have cancer screening at various ages. This may include screening for: Breast cancer. Cervical cancer. Colorectal cancer. Skin cancer. Lung cancer. What should I know about heart disease, diabetes, and high blood pressure? Blood pressure and heart disease High blood pressure causes heart disease and increases the risk of stroke. This is more likely to develop in people who have high blood pressure readings or are overweight. Have your blood pressure checked: Every 3-5 years if you are 18-39 years of age. Every year if you are 40 years old or older. Diabetes Have regular diabetes screenings. This checks your fasting blood sugar level. Have the screening done: Once every three years after age 40 if you are at a normal weight and have a low risk for diabetes. More often and at a younger age if you are overweight or have a high risk for diabetes. What should I know about preventing infection? Hepatitis B If you have a higher risk for hepatitis B, you should be screened for this virus. Talk with your health care provider to find out if you are at risk for hepatitis B infection. Hepatitis C Testing is recommended for: Everyone born from 1945 through 1965. Anyone with known risk factors for hepatitis C. Sexually transmitted infections (STIs) Get screened for STIs, including gonorrhea and chlamydia, if: You are sexually active and are younger than 48 years of age. You are   older than 48 years of age and your health care provider tells you that you are at risk for this type of infection. Your sexual activity has changed since you were last screened, and you are at increased risk for chlamydia or gonorrhea. Ask your health care provider if you are at risk. Ask your health care provider about whether you are at high risk for HIV. Your health  care provider may recommend a prescription medicine to help prevent HIV infection. If you choose to take medicine to prevent HIV, you should first get tested for HIV. You should then be tested every 3 months for as long as you are taking the medicine. Pregnancy If you are about to stop having your period (premenopausal) and you may become pregnant, seek counseling before you get pregnant. Take 400 to 800 micrograms (mcg) of folic acid every day if you become pregnant. Ask for birth control (contraception) if you want to prevent pregnancy. Osteoporosis and menopause Osteoporosis is a disease in which the bones lose minerals and strength with aging. This can result in bone fractures. If you are 65 years old or older, or if you are at risk for osteoporosis and fractures, ask your health care provider if you should: Be screened for bone loss. Take a calcium or vitamin D supplement to lower your risk of fractures. Be given hormone replacement therapy (HRT) to treat symptoms of menopause. Follow these instructions at home: Alcohol use Do not drink alcohol if: Your health care provider tells you not to drink. You are pregnant, may be pregnant, or are planning to become pregnant. If you drink alcohol: Limit how much you have to: 0-1 drink a day. Know how much alcohol is in your drink. In the [place] one drink equals one 12 oz bottle of beer (355 mL), one 5 oz glass of wine (148 mL), or one 1 oz glass of hard liquor (44 mL). Lifestyle Do not use any products that contain nicotine or tobacco. These products include cigarettes, chewing tobacco, and vaping devices, such as e-cigarettes. If you need help quitting, ask your health care provider. Do not use street drugs. Do not share needles. Ask your health care provider for help if you need support or information about quitting drugs. General instructions Schedule regular health, dental, and eye exams. Stay current with your vaccines. Tell your health  care provider if: You often feel depressed. You have ever been abused or do not feel safe at home. Summary Adopting a healthy lifestyle and getting preventive care are important in promoting health and wellness. Follow your health care provider's instructions about healthy diet, exercising, and getting tested or screened for diseases. Follow your health care provider's instructions on monitoring your cholesterol and blood pressure. This information is not intended to replace advice given to you by your health care provider. Make sure you discuss any questions you have with your health care provider. Document Revised: 02/27/2021 Document Reviewed: 02/27/2021 Elsevier Patient Education  2023 [employer]  

## 2022-05-21 NOTE — Progress Notes (Signed)
Patient ID: Erika Weaver, female  DOB: 1976/01/18, 46 y.o.   MRN: [id number] Patient Care Team    Relationship Specialty Notifications Start End  Natalia Leatherwood, DO PCP - General Family Medicine  07/15/19   Richarda Overlie, MD Consulting Physician Obstetrics and Gynecology  08/30/16   Josph Macho, MD Consulting Physician Oncology  08/25/19   Octavia Bruckner, MD Referring Physician Internal Medicine  08/25/19   Runell Gess, MD Consulting Physician Cardiology  08/25/19   Meryl Dare, MD Consulting Physician Gastroenterology  08/25/19     Chief Complaint  Patient presents with   Annual Exam    Pt is fasting    Subjective:  Erika Weaver is a 46 y.o.  Female  present for CPE. All past medical history, surgical history, allergies, family history, immunizations, medications and social history were updated in the electronic medical record today. All recent labs, ED visits and hospitalizations within the last year were reviewed.  Health maintenance:  Colonoscopy: MGM colon cancer in her 69s. Pt elected cologuard screening> 2021 normal, next 2024 Mammogram: completed:06/03/2020,[facility]> ordered for next year. Cervical cancer screening: last pap: 09/21/2018 with gyn. Immunizations: tdap 07/2013 UTD, Influenza UTD (encouraged yearly), covid series completed  Infectious disease screening: HIV completed, Hep C completed today DEXA:routine screen Assistive device: none Oxygen XBJ:YNWG Patient has a Dental home. Hospitalizations/ED visits: reviewed  Essential hypertension/Hyperlipidemia, mixed/Morbid obesity (HCC) Pt reports compliance with amlodipine 5 mg daily. Patient denies chest pain, shortness of breath, dizziness or lower extremity edema.  Pt does not daily baby ASA. Pt is not prescribed statin.   Anxiety/persistent postural perceptional vertigo Patient reports she was having dizziness and was getting frustrated secondary to diagnosis unable to be identified.   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  She reports they diagnosed her w/ PPPV and started her on low-dose Zoloft.   Since that time she has had a great response to the medication with only occasional recurrence.  She is compliant with zoloft 50 mg qd     02/12/2022    2:03 PM 11/15/2021    3:31 PM 09/08/2020    9:23 AM 08/25/2019    2:42 PM 06/05/2019    8:02 AM  Depression screen PHQ 2/9  Decreased Interest 0 0 0 1 0  Down, Depressed, Hopeless 0 0 0 0 0  PHQ - 2 Score 0 0 0 1 0  Altered sleeping 0 0   0  Tired, decreased energy 0 0   0  Change in appetite 0 0   0  Feeling bad or failure about yourself  0 0   0  Trouble concentrating 0 0   0  Moving slowly or fidgety/restless 0 0   0  Suicidal thoughts 0 0   0  PHQ-9 Score 0 0   0  Difficult doing work/chores     Not difficult at all      02/12/2022    2:06 PM 11/15/2021    3:31 PM 03/25/2020    9:46 AM 08/25/2019    2:42 PM  GAD 7 : Generalized Anxiety Score  Nervous, Anxious, on Edge 0 0 0 0  Control/stop worrying 0 0 0 0  Worry too much - different things 0 0 0 0  Trouble relaxing 0 0 0 0  Restless 0 0 0 0  Easily annoyed or irritable 0 0 0 0  Afraid - awful might  happen 0 0 0 0  Total GAD 7 Score 0 0 0 0  Anxiety Difficulty   Not difficult at all Not difficult at all     Immunization History  Administered Date(s) Administered   Influenza Split 07/22/2009   Influenza Whole 07/23/2011   Influenza,inj,Quad PF,6+ Mos 08/18/2013, 12/09/2014, 09/22/2015, 08/30/2016, 09/05/2017, 07/31/2018, 08/25/2019, 09/08/2020, 11/15/2021   Influenza,inj,quad, With Preservative 08/22/2017   Influenza-Unspecified 07/27/2011   PFIZER(Purple Top)SARS-COV-2 Vaccination 07/01/2020, 07/22/2020   Tdap 12/16/2009, 08/18/2013     Past Medical History:  Diagnosis Date   Allergic state 11/22/2011   Anxiety 11/22/2011   Back pain 01/31/2013   Bartholin cyst 03/01/2013   BPV (benign positional  vertigo) 08/23/2013   Chicken pox as a child   Headache 11/22/2011   Overview:  Headache  ICD-10 cut over     Hypertension    Kidney stone 12/21/2012   Other and unspecified hyperlipidemia 03/07/2014   Plantar fasciitis 01/11/2012   Rectal vaginal fistula 01/31/2013   Retrosternal chest pain 11/08/2015   Sinusitis 11/22/2011   Sleep apnea 04/03/2012   setting 2   Visual changes 11/22/2011   Allergies  Allergen Reactions   Topamax [Topiramate]     dizziness   Zonegran [Zonisamide]     dizziness   Past Surgical History:  Procedure Laterality Date   GUM SURGERY     PERINEAL BODY REPAIR     4 th degree tear with fistula between vagina and retum requiring 3 surgeries for correction   rectal fistula repair     x 2   WISDOM TOOTH EXTRACTION     Family History  Problem Relation Age of Onset   Diabetes Mother        type 2   Hypertension Mother    Hyperlipidemia Mother    Hypertension Father    Hyperlipidemia Father    Aneurysm Father        abdominal aorta   Cancer Maternal Grandmother 63       colon, ovarian   Other Maternal Grandfather 41       brain hemorrhage   Stroke Maternal Grandfather    Aneurysm Maternal Grandfather    Heart attack Paternal Grandmother    Heart disease Paternal Grandmother        MI   Alzheimer's disease Paternal Grandfather    Dementia Paternal Grandfather    Heart attack Paternal Aunt    Heart disease Paternal Aunt         AAA rupture   Other Paternal Uncle        cerebreal brain hemorrhage   Stroke Paternal Uncle    Aneurysm Paternal Uncle    Asthma Daughter    Other Daughter        peanut allergy   Heart disease Maternal Aunt        s/p cabg. brain aneurysm s/p coiling   Aneurysm Maternal Aunt        coil behind eye   Social History   Social History Narrative   Marital status/children/pets: 1 child.  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   Safety:      -smoke alarm in the home:Yes     - wears  seatbelt: Yes     - Feels safe in their relationships: Yes    Allergies as of 05/21/2022       Reactions   Topamax [topiramate]    dizziness   Zonegran [zonisamide]    dizziness  Medication List        Accurate as of May 21, 2022  8:44 AM. If you have any questions, ask your nurse or doctor.          STOP taking these medications    meclizine 25 MG tablet Commonly known as: ANTIVERT Stopped by: Felix Pacini, DO       TAKE these medications    albuterol 108 (90 Base) MCG/ACT inhaler Commonly known as: VENTOLIN HFA Inhale 2 puffs into the lungs every 6 (six) hours as needed for wheezing or shortness of breath.   amLODipine 5 MG tablet Commonly known as: NORVASC TAKE 1 TABLET(5 MG) BY MOUTH DAILY   budesonide-formoterol 160-4.5 MCG/ACT inhaler Commonly known as: Symbicort Inhale 2 puffs into the lungs in the morning and at bedtime.   Multi-Vitamins Tabs Take by mouth.   sertraline 50 MG tablet Commonly known as: ZOLOFT Take 1 tablet (50 mg total) by mouth daily.   valACYclovir 500 MG tablet Commonly known as: VALTREX Take by mouth.        All past medical history, surgical history, allergies, family history, immunizations andmedications were updated in the EMR today and reviewed under the history and medication portions of their EMR.      MM 3D SCREEN BREAST BILATERAL Result Date: 06/07/2021 RECOMMENDATION: Screening mammogram in one year. (Code:SM-B-01Y) BI-RADS CATEGORY  1: Negative.   ROS 14 pt review of systems performed and negative (unless mentioned in an HPI)  Objective: BP 118/74   Pulse 75   Temp 98 F (36.7 C) (Oral)   Ht 5' 5.25" (1.657 m)   Wt 221 lb (100.2 kg)   LMP 05/21/2022   SpO2 97%   BMI 36.50 kg/m  Physical Exam Vitals and nursing note reviewed.  Constitutional:      General: She is not in acute distress.    Appearance: Normal appearance. She is obese. She is not ill-appearing or toxic-appearing.  HENT:      Head: Normocephalic and atraumatic.     Right Ear: Tympanic membrane, ear canal and external ear normal. There is no impacted cerumen.     Left Ear: Tympanic membrane, ear canal and external ear normal. There is no impacted cerumen.     Nose: No congestion or rhinorrhea.     Mouth/Throat:     Mouth: Mucous membranes are moist.     Pharynx: Oropharynx is clear. No oropharyngeal exudate or posterior oropharyngeal erythema.  Eyes:     General: No scleral icterus.       Right eye: No discharge.        Left eye: No discharge.     Extraocular Movements: Extraocular movements intact.     Conjunctiva/sclera: Conjunctivae normal.     Pupils: Pupils are equal, round, and reactive to light.  Cardiovascular:     Rate and Rhythm: Normal rate and regular rhythm.     Pulses: Normal pulses.     Heart sounds: Normal heart sounds. No murmur heard.    No friction rub. No gallop.  Pulmonary:     Effort: Pulmonary effort is normal. No respiratory distress.     Breath sounds: Normal breath sounds. No stridor. No wheezing, rhonchi or rales.  Chest:     Chest wall: No tenderness.  Abdominal:     General: Abdomen is flat. Bowel sounds are normal. There is no distension.     Palpations: Abdomen is soft. There is no mass.     Tenderness: There is no abdominal tenderness.  There is no right CVA tenderness, left CVA tenderness, guarding or rebound.     Hernia: No hernia is present.  Musculoskeletal:        General: No swelling, tenderness or deformity. Normal range of motion.     Cervical back: Normal range of motion and neck supple. No rigidity or tenderness.     Right lower leg: No edema.     Left lower leg: No edema.  Lymphadenopathy:     Cervical: No cervical adenopathy.  Skin:    General: Skin is warm and dry.     Coloration: Skin is not jaundiced or pale.     Findings: No bruising, erythema, lesion or rash.  Neurological:     General: No focal deficit present.     Mental Status: She is alert and  oriented to person, place, and time. Mental status is at baseline.     Cranial Nerves: No cranial nerve deficit.     Sensory: No sensory deficit.     Motor: No weakness.     Coordination: Coordination normal.     Gait: Gait normal.     Deep Tendon Reflexes: Reflexes normal.  Psychiatric:        Mood and Affect: Mood normal.        Behavior: Behavior normal.        Thought Content: Thought content normal.        Judgment: Judgment normal.      No results found.  Assessment/plan: Erika Weaver is a 46 y.o. female present for CPE/CMC Essential hypertension/Hyperlipidemia, mixed/ Morbid obesity (HCC)/Body mass index is 36.16 kg/m. Stable Continue amlodipine 5 mg daily Low-sodium diet.   CBC, CMP, TSH and lipids collected today   Leukemoid reaction Patient with chronic elevation in WBCs for the past 2 years.  Follows with Dr. Myna HidalgoEnnever hematology.  No identifiable causes. CBC collected today   Benign paroxysmal positional vertigo, unspecified laterality/PPPD/anxiety Stable Patient has been evaluated by to local neurologist and [facility].   Continue zoloft to 50 mg  Vitamin D deficiency - VITAMIN D 25 Hydroxy (Vit-D Deficiency, Fractures) Encounter for long-term current use of medication - Hemoglobin A1c Encounter for general adult medical examination with abnormal findings Colonoscopy: MGM colon cancer in her 4370s. Pt elected cologuard screening> 2021 normal, next 2024 Mammogram: completed:06/03/2020,[facility]> ordered for next year. Cervical cancer screening: last pap: 09/21/2018 with gyn. Immunizations: tdap 07/2013 UTD, Influenza UTD (encouraged yearly), covid series completed  Infectious disease screening: HIV completed, Hep C completed today DEXA:routine screen Patient was encouraged to exercise greater than 150 minutes a week. Patient was encouraged to choose a diet filled with fresh fruits and vegetables, and lean meats. AVS provided to patient today for  education/recommendation on gender specific health and safety maintenance. Return in about 24 weeks (around 11/05/2022) for Routine chronic condition follow-up.  Orders Placed This Encounter  Procedures   CBC   Comprehensive metabolic panel   Hemoglobin A1c   Lipid panel   TSH   VITAMIN D 25 Hydroxy (Vit-D Deficiency, Fractures)    Orders Placed This Encounter  Procedures   CBC   Comprehensive metabolic panel   Hemoglobin A1c   Lipid panel   TSH   VITAMIN D 25 Hydroxy (Vit-D Deficiency, Fractures)   Meds ordered this encounter  Medications   amLODipine (NORVASC) 5 MG tablet    Sig: TAKE 1 TABLET(5 MG) BY MOUTH DAILY    Dispense:  90 tablet    Refill:  1   sertraline (  ZOLOFT) 50 MG tablet    Sig: Take 1 tablet (50 mg total) by mouth daily.    Dispense:  90 tablet    Refill:  1   Referral Orders  No referral(s) requested today     Electronically signed by: Felix Pacini, DO [facility] [place]

## 2022-05-22 ENCOUNTER — Telehealth: Payer: Self-pay | Admitting: Family Medicine

## 2022-05-22 LAB — COMPREHENSIVE METABOLIC PANEL
ALT: 15 U/L (ref 0–35)
AST: 13 U/L (ref 0–37)
Albumin: 3.8 g/dL (ref 3.5–5.2)
Alkaline Phosphatase: 75 U/L (ref 39–117)
BUN: 12 mg/dL (ref 6–23)
CO2: 26 mEq/L (ref 19–32)
Calcium: 9.9 mg/dL (ref 8.4–10.5)
Chloride: 104 mEq/L (ref 96–112)
Creatinine, Ser: 0.71 mg/dL (ref 0.40–1.20)
GFR: 101.03 mL/min (ref >60.00)
Glucose, Bld: 93 mg/dL (ref 70–99)
Potassium: 4.5 mEq/L (ref 3.5–5.1)
Sodium: 138 mEq/L (ref 135–145)
Total Bilirubin: 0.4 mg/dL (ref 0.2–1.2)
Total Protein: 7.2 g/dL (ref 6.0–8.3)

## 2022-05-22 LAB — LIPID PANEL
Cholesterol: 176 mg/dL (ref 0–200)
HDL: 42.6 mg/dL (ref >39.00)
LDL Cholesterol: 103 mg/dL — ABNORMAL HIGH (ref 0–99)
NonHDL: 133.26
Total CHOL/HDL Ratio: 4
Triglycerides: 150 mg/dL — ABNORMAL HIGH (ref 0.0–149.0)
VLDL: 30 mg/dL (ref 0.0–40.0)

## 2022-05-22 MED ORDER — VITAMIN D (ERGOCALCIFEROL) 1.25 MG (50000 UNIT) PO CAPS: ORAL_CAPSULE | ORAL | 1 refills | Status: DC

## 2022-05-22 NOTE — Telephone Encounter (Signed)
LVM for pt to CB regarding results.  

## 2022-05-22 NOTE — Telephone Encounter (Signed)
Patient returning call about her lab results from yesterday.  Please call back when available.

## 2022-05-22 NOTE — Telephone Encounter (Signed)
Please call patient Liver, kidney and thyroid function are normal Blood cell counts and electrolytes are normal Diabetes screening/A1c is normal Cholesterol panel looks good and is at goal for her.  Vitamin D is extremely low at 13.45.  Goal is at least 30 and ideally 40-50 for bone health. I have called in high-dose vitamin D to be taken twice a week with food, preferably spread out by few days, such as like Monday and Friday.  Follow-up in 12 weeks with provider for vitamin D recheck.

## 2022-05-22 NOTE — Telephone Encounter (Signed)
Spoke with pt regarding labs and instructions.   

## 2022-05-22 NOTE — Progress Notes (Signed)
Please supply the records requested.

## 2022-06-07 ENCOUNTER — Ambulatory Visit
Admission: RE | Admit: 2022-06-07 | Discharge: 2022-06-07 | Disposition: A | Discharge status: Home / Self Care | Payer: 59 | Source: Ambulatory Visit | Attending: Family Medicine | Admitting: Family Medicine

## 2022-06-07 DIAGNOSIS — Z1231 Encounter for screening mammogram for malignant neoplasm of breast: Secondary | ICD-10-CM

## 2022-08-15 ENCOUNTER — Ambulatory Visit: Payer: 59 | Admitting: Family Medicine

## 2022-10-10 ENCOUNTER — Ambulatory Visit (INDEPENDENT_AMBULATORY_CARE_PROVIDER_SITE_OTHER): Payer: 59

## 2022-10-10 DIAGNOSIS — Z23 Encounter for immunization: Secondary | ICD-10-CM

## 2022-10-18 ENCOUNTER — Encounter: Payer: Self-pay | Admitting: Family Medicine

## 2022-11-05 ENCOUNTER — Ambulatory Visit: Payer: 59 | Admitting: Family Medicine

## 2022-11-05 ENCOUNTER — Encounter: Payer: Self-pay | Admitting: Family Medicine

## 2022-11-05 VITALS — BP 123/82 | HR 81 | Temp 98.0°F | Ht 65.25 in | Wt 213.0 lb

## 2022-11-05 DIAGNOSIS — H8110 Benign paroxysmal vertigo, unspecified ear: Secondary | ICD-10-CM | POA: Diagnosis not present

## 2022-11-05 DIAGNOSIS — I10 Essential (primary) hypertension: Secondary | ICD-10-CM

## 2022-11-05 DIAGNOSIS — E782 Mixed hyperlipidemia: Secondary | ICD-10-CM | POA: Diagnosis not present

## 2022-11-05 DIAGNOSIS — F419 Anxiety disorder, unspecified: Secondary | ICD-10-CM

## 2022-11-05 DIAGNOSIS — E559 Vitamin D deficiency, unspecified: Secondary | ICD-10-CM

## 2022-11-05 MED ORDER — AMLODIPINE BESYLATE 5 MG PO TABS: ORAL_TABLET | ORAL | 1 refills | Status: DC

## 2022-11-05 MED ORDER — SERTRALINE HCL 50 MG PO TABS: 50.0000 mg | ORAL_TABLET | Freq: Every day | ORAL | 1 refills | Status: DC

## 2022-11-05 NOTE — Progress Notes (Signed)
Patient ID: Erika Weaver, female  DOB: 08/22/1976, 47 y.o.   MRN: [id number] Patient Care Team    Relationship Specialty Notifications Start End  Natalia Leatherwood, DO PCP - General Family Medicine  07/15/19   Richarda Overlie, MD Consulting Physician Obstetrics and Gynecology  08/30/16   Josph Macho, MD Consulting Physician Oncology  08/25/19   Octavia Bruckner, MD Referring Physician Internal Medicine  08/25/19   Runell Gess, MD Consulting Physician Cardiology  08/25/19   Meryl Dare, MD Consulting Physician Gastroenterology  08/25/19     Chief Complaint  Patient presents with   Hypertension    Cmc; pt is not fasting    Subjective:  Erika Weaver is a 47 y.o.  Female  present for [facility] All past medical history, surgical history, allergies, family history, immunizations, medications and social history were updated in the electronic medical record today. All recent labs, ED visits and hospitalizations within the last year were reviewed.   Essential hypertension/Hyperlipidemia, mixed/Morbid obesity (HCC) Pt reports compliance with amlodipine 5 mg daily. Patient denies chest pain, shortness of breath, dizziness or lower extremity edema.  Pt does not daily baby ASA. Pt is not prescribed statin.   Anxiety/persistent postural perceptional vertigo Patient reports she was having dizziness and was getting frustrated secondary to diagnosis unable to be identified.  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  She reports they diagnosed her w/ PPPV and started her on low-dose Zoloft.   Since that time she has had a great response to the medication with only occasional recurrence.  She is compliant with zoloft 50 mg qd, and feels it is working well for her today.     11/05/2022    2:29 PM 02/12/2022    2:03 PM 11/15/2021    3:31 PM 09/08/2020    9:23 AM 08/25/2019    2:42 PM  Depression screen PHQ 2/9  Decreased Interest 0 0  0 0 1  Down, Depressed, Hopeless 0 0 0 0 0  PHQ - 2 Score 0 0 0 0 1  Altered sleeping 0 0 0    Tired, decreased energy 0 0 0    Change in appetite 0 0 0    Feeling bad or failure about yourself  0 0 0    Trouble concentrating 0 0 0    Moving slowly or fidgety/restless 0 0 0    Suicidal thoughts 0 0 0    PHQ-9 Score 0 0 0        11/05/2022    2:30 PM 02/12/2022    2:06 PM 11/15/2021    3:31 PM 03/25/2020    9:46 AM  GAD 7 : Generalized Anxiety Score  Nervous, Anxious, on Edge 0 0 0 0  Control/stop worrying 0 0 0 0  Worry too much - different things 0 0 0 0  Trouble relaxing 0 0 0 0  Restless 0 0 0 0  Easily annoyed or irritable 0 0 0 0  Afraid - awful might happen  0 0 0  Total GAD 7 Score  0 0 0  Anxiety Difficulty    Not difficult at all     Immunization History  Administered Date(s) Administered   Influenza Split 07/22/2009   Influenza Whole 07/23/2011   Influenza,inj,Quad PF,6+ Mos 08/18/2013, 12/09/2014, 09/22/2015, 08/30/2016, 09/05/2017, 07/31/2018, 08/25/2019, 09/08/2020, 11/15/2021, 10/10/2022   Influenza,inj,quad, With Preservative 08/22/2017   Influenza-Unspecified 07/27/2011  PFIZER(Purple Top)SARS-COV-2 Vaccination 07/01/2020, 07/22/2020   Tdap 12/16/2009, 08/18/2013     Past Medical History:  Diagnosis Date   Allergic state 11/22/2011   Anxiety 11/22/2011   Back pain 01/31/2013   Bartholin cyst 03/01/2013   BPV (benign positional vertigo) 08/23/2013   Chicken pox as a child   Headache 11/22/2011   Overview:  Headache  ICD-10 cut over     Herpes zoster 12/18/2010   Hypertension    Kidney stone 12/21/2012   Other and unspecified hyperlipidemia 03/07/2014   Plantar fasciitis 01/11/2012   Rectal vaginal fistula 01/31/2013   Retrosternal chest pain 11/08/2015   Sinusitis 11/22/2011   Sleep apnea 04/03/2012   setting 2   Visual changes 11/22/2011   Allergies  Allergen Reactions   Topamax [Topiramate]     dizziness   Zonegran [Zonisamide]      dizziness   Past Surgical History:  Procedure Laterality Date   GUM SURGERY     PERINEAL BODY REPAIR     4 th degree tear with fistula between vagina and retum requiring 3 surgeries for correction   rectal fistula repair     x 2   WISDOM TOOTH EXTRACTION     Family History  Problem Relation Age of Onset   Diabetes Mother        type 2   Hypertension Mother    Hyperlipidemia Mother    Hypertension Father    Hyperlipidemia Father    Aneurysm Father        abdominal aorta   Cancer Maternal Grandmother 82       colon, ovarian   Other Maternal Grandfather 63       brain hemorrhage   Stroke Maternal Grandfather    Aneurysm Maternal Grandfather    Heart attack Paternal Grandmother    Heart disease Paternal Grandmother        MI   Alzheimer's disease Paternal Grandfather    Dementia Paternal Grandfather    Heart attack Paternal Aunt    Heart disease Paternal Aunt         AAA rupture   Other Paternal Uncle        cerebreal brain hemorrhage   Stroke Paternal Uncle    Aneurysm Paternal Uncle    Asthma Daughter    Other Daughter        peanut allergy   Heart disease Maternal Aunt        s/p cabg. brain aneurysm s/p coiling   Aneurysm Maternal Aunt        coil behind eye   Social History   Social History Narrative   Marital status/children/pets: 1 child.  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   Safety:      -smoke alarm in the home:Yes     - wears seatbelt: Yes     - Feels safe in their relationships: Yes    Allergies as of 11/05/2022       Reactions   Topamax [topiramate]    dizziness   Zonegran [zonisamide]    dizziness        Medication List        Accurate as of November 05, 2022  2:45 PM. If you have any questions, ask your nurse or doctor.          albuterol 108 (90 Base) MCG/ACT inhaler Commonly known as: VENTOLIN HFA Inhale 2 puffs into the lungs every 6 (six) hours as needed for wheezing or shortness of  breath.   amLODipine 5 MG tablet Commonly known as: NORVASC TAKE 1 TABLET(5 MG) BY MOUTH DAILY   budesonide-formoterol 160-4.5 MCG/ACT inhaler Commonly known as: Symbicort Inhale 2 puffs into the lungs in the morning and at bedtime.   Multi-Vitamins Tabs Take by mouth.   sertraline 50 MG tablet Commonly known as: ZOLOFT Take 1 tablet (50 mg total) by mouth daily.   valACYclovir 500 MG tablet Commonly known as: VALTREX Take by mouth.   Vitamin D (Ergocalciferol) 1.25 MG (50000 UNIT) Caps capsule Commonly known as: DRISDOL 1 capsule p.o. with food on Mondays and Fridays.        All past medical history, surgical history, allergies, family history, immunizations andmedications were updated in the EMR today and reviewed under the history and medication portions of their EMR.       ROS 14 pt review of systems performed and negative (unless mentioned in an HPI)  Objective: BP 123/82   Pulse 81   Temp 98 F (36.7 C) (Oral)   Ht 5' 5.25" (1.657 m)   Wt 213 lb (96.6 kg)   SpO2 99%   BMI 35.17 kg/m  Physical Exam Vitals and nursing note reviewed.  Constitutional:      General: She is not in acute distress.    Appearance: Normal appearance. She is not ill-appearing, toxic-appearing or diaphoretic.  HENT:     Head: Normocephalic and atraumatic.  Eyes:     General: No scleral icterus.       Right eye: No discharge.        Left eye: No discharge.     Extraocular Movements: Extraocular movements intact.     Conjunctiva/sclera: Conjunctivae normal.     Pupils: Pupils are equal, round, and reactive to light.  Cardiovascular:     Rate and Rhythm: Normal rate and regular rhythm.  Pulmonary:     Effort: Pulmonary effort is normal. No respiratory distress.     Breath sounds: Normal breath sounds. No wheezing, rhonchi or rales.  Musculoskeletal:     Right lower leg: No edema.     Left lower leg: No edema.  Skin:    General: Skin is warm.     Findings: No rash.   Neurological:     Mental Status: She is alert and oriented to person, place, and time. Mental status is at baseline.     Motor: No weakness.     Gait: Gait normal.  Psychiatric:        Mood and Affect: Mood normal.        Behavior: Behavior normal.        Thought Content: Thought content normal.        Judgment: Judgment normal.      No results found.  Assessment/plan: Erika Weaver is a 47 y.o. female present for CPE/CMC Essential hypertension/Hyperlipidemia, mixed/ Morbid obesity (HCC)/Body mass index is 36.16 kg/m. Stable Continue  amlodipine 5 mg daily Low-sodium diet.   Labs due next visit  Leukemoid reaction Patient with chronic elevation in WBCs for the past 2 years.  Follows with Dr. Marin Olp hematology.  No identifiable causes.  Benign paroxysmal positional vertigo, unspecified laterality/PPPD/anxiety Stable Patient has been evaluated by to local neurologist and [facility].   Continue  zoloft to 50 mg  Vit d deficiency: Vit d collected today Will guide on supplement after results.  Currently 2x weekly high dose 50K.   Return in about 7 months (around 05/23/2023) for cpe (20 min), Routine chronic condition follow-up.  Orders Placed This Encounter  Procedures   Vitamin D (25 hydroxy)   Meds ordered this encounter  Medications   amLODipine (NORVASC) 5 MG tablet    Sig: TAKE 1 TABLET(5 MG) BY MOUTH DAILY    Dispense:  90 tablet    Refill:  1   sertraline (ZOLOFT) 50 MG tablet    Sig: Take 1 tablet (50 mg total) by mouth daily.    Dispense:  90 tablet    Refill:  1   Referral Orders  No referral(s) requested today     Electronically signed by: Felix Pacini, DO [facility] [place]

## 2022-11-05 NOTE — Patient Instructions (Addendum)
Return in about 7 months (around 05/23/2023) for cpe (20 min), Routine chronic condition follow-up.        Great to see you today.  I have refilled the medication(s) we provide.   If labs were collected, we will inform you of lab results once received either by echart message or telephone call.   - echart message- for normal results that have been seen by the patient already.   - telephone call: abnormal results or if patient has not viewed results in their echart.

## 2022-11-06 ENCOUNTER — Telehealth: Payer: Self-pay | Admitting: Family Medicine

## 2022-11-06 LAB — VITAMIN D 25 HYDROXY (VIT D DEFICIENCY, FRACTURES): Vit D, 25-Hydroxy: 45 ng/mL (ref 30–100)

## 2022-11-06 MED ORDER — VITAMIN D (ERGOCALCIFEROL) 1.25 MG (50000 UNIT) PO CAPS: 50000.0000 [IU] | ORAL_CAPSULE | ORAL | 1 refills | Status: DC

## 2022-11-06 NOTE — Telephone Encounter (Signed)
Spoke with pt regarding labs and instructions.   

## 2022-11-06 NOTE — Telephone Encounter (Signed)
Please inform patient her vitamin D is much improved and is now in normal range in the mid 40s. Continue vitamin D supplement once weekly to keep levels into normal range.  Refill has been called in for her.

## 2022-11-12 ENCOUNTER — Ambulatory Visit: Payer: 59

## 2022-11-12 DIAGNOSIS — G4733 Obstructive sleep apnea (adult) (pediatric): Secondary | ICD-10-CM

## 2022-11-14 DIAGNOSIS — G4733 Obstructive sleep apnea (adult) (pediatric): Secondary | ICD-10-CM | POA: Diagnosis not present

## 2022-12-03 ENCOUNTER — Telehealth: Payer: Self-pay | Admitting: Pulmonary Disease

## 2022-12-04 ENCOUNTER — Other Ambulatory Visit: Payer: Self-pay | Admitting: Pulmonary Disease

## 2022-12-04 DIAGNOSIS — G4730 Sleep apnea, unspecified: Secondary | ICD-10-CM

## 2022-12-04 DIAGNOSIS — J4520 Mild intermittent asthma, uncomplicated: Secondary | ICD-10-CM

## 2022-12-04 NOTE — Telephone Encounter (Signed)
Already called and went over sleep study results. Nothing further needed

## 2022-12-04 NOTE — Progress Notes (Signed)
Sleep study reviewed - mild sleep apnea - recommend auto-titrating CPAP 5-15 cm H2O. Will need f/u visit with me 31-90 days after receiving machine for review.

## 2022-12-24 ENCOUNTER — Ambulatory Visit: Payer: 59 | Admitting: Internal Medicine

## 2022-12-24 ENCOUNTER — Other Ambulatory Visit: Payer: 59

## 2022-12-24 ENCOUNTER — Encounter: Payer: Self-pay | Admitting: Internal Medicine

## 2022-12-24 ENCOUNTER — Telehealth: Payer: Self-pay

## 2022-12-24 VITALS — BP 126/88 | HR 76 | Temp 98.0°F | Ht 65.25 in | Wt 215.4 lb

## 2022-12-24 DIAGNOSIS — R0789 Other chest pain: Secondary | ICD-10-CM | POA: Insufficient documentation

## 2022-12-24 MED ORDER — IBUPROFEN 800 MG PO TABS: 800.0000 mg | ORAL_TABLET | Freq: Three times a day (TID) | ORAL | 0 refills | Status: DC | PRN

## 2022-12-24 NOTE — Progress Notes (Signed)
[facility]: [id number]   Routine Medical Office Visit  Patient:  Erika Weaver      Age: 47 y.o.       Sex:  female  Date:   12/24/2022  PCP:    Ma Hillock, DO   [place] Provider: Loralee Pacas, MD   Assessment and Plan:   [place] was seen today for pain under right breast.  Other chest pain Assessment & Plan:  Performed above EKG EKG: normal sinus rhythm with rate 58, normal axis, normal  intervals, left atrial  hypertrophy, nonspecific probably normal twi in v1/v2   Patients chart review and interview were used to generate a prompt for artificial intelligence analysis ([employer] artificial intelligence) clinical decision support.  AI output was reviewed and is provided in red:   Comprehensive Review of the Case: A 47 year old female with a complex medical history including anxiety, sleep apnea, rectal vaginal fistula, benign paroxysmal positional vertigo, mixed hyperlipidemia, essential hypertension, GERD, vitamin D deficiency, morbid obesity, leukocytosis, and allergic rhinitis presents with intermittent, sharp pain under the right breast radiating to the right posterior side for about a month. The pain has gradually worsened, becoming more intense and frequent, especially during the weekend and today. Her past medical history is significant for allergic state, back pain, Bartholin cyst, BPV, chicken pox in childhood, headache, herpes zoster, hypertension, kidney stone, plantar fasciitis, retrosternal chest pain, sinusitis, sleep apnea, and visual changes. Her current medications include albuterol, amlodipine, budesonide-formoterol, multivitamins, sertraline, vitamin D (ergocalciferol), and valacyclovir (not currently taking). Her blood pressure is 126/88 mmHg. The summary does not provide specific details on physical exam findings beyond vitals, diagnostic study results, or initial treatments and their responses, which are crucial for a  comprehensive assessment.  Clinical Problem Representation: A 47 year old female with a history of multiple comorbidities including anxiety, sleep apnea, and morbid obesity, presents with a month-long history of intermittent, sharp pain under the right breast radiating to the right posterior side, which has been worsening in intensity and frequency. The patient's complex medical background and current medication regimen suggest a multifactorial etiology that may involve musculoskeletal, gastrointestinal, or cardiopulmonary systems.   Most Likely Diagnoses: - Costochondritis: This diagnosis is considered due to the location and description of the pain (sharp, under the right breast, radiating posteriorly), which could be consistent with inflammation of the costal cartilage. The patient's obesity and history of physical strain or unusual activities could contribute to this condition. However, the absence of physical examination findings, such as tenderness over the costochondral junctions, makes this diagnosis less certain without further evaluation.  - Gastroesophageal Reflux Disease (GERD) with possible esophageal spasm: Given the patient's history of GERD, the pain described could be related to acid reflux or esophageal spasms, which can present as sharp pain and may radiate. The worsening of symptoms during the weekend could correlate with dietary changes or increased stress. However, the typical presentation of GERD involves more burning pain, and esophageal spasms usually have a different character of pain.  - Gallbladder disease (e.g., cholecystitis or biliary colic): The location of the pain could also suggest gallbladder disease, especially if it is related to meal intake or specific types of food. The patient's demographic (age and obesity) increases the risk for gallstones and gallbladder disease. However, the lack of information on the relationship of pain with meals, as well as the absence of  other symptoms such as nausea, vomiting, or fever, makes this diagnosis less certain without further diagnostic imaging.  Expanded Differential Diagnoses: - Pleuritic pain: This could be secondary to a pulmonary process such as a pleural effusion or pneumothorax, especially given the sharp nature of the pain and its radiation to the posterior side. However, the absence of respiratory symptoms like shortness of breath or cough, and the lack of physical exam findings or imaging results, limits the ability to fully assess this possibility.  - Musculoskeletal strain: Given the patient's obesity and potential for altered biomechanics, a musculoskeletal strain or sprain could present with sharp, radiating pain. This diagnosis would be supported by a history of recent physical activity or trauma, but the lack of specific examination findings or history details makes this less certain.  - Herpes zoster (Shingles): Although less likely without a rash or dermatomal distribution of pain, herpes zoster could present with sharp, radiating pain before the appearance of the rash. The patient's history of herpes zoster increases the risk of recurrence. However, the typical presentation involves a vesicular rash, which has not been reported.   Can't Miss Differential Diagnosis: - Pulmonary embolism (PE): While not the most likely diagnosis given the presentation, it is a critical "can't miss" diagnosis due to its potential for rapid deterioration and fatality. The patient's obesity and potentially sedentary lifestyle due to sleep apnea and other comorbidities increase her risk for venous thromboembolism. The absence of respiratory symptoms or risk factors such as recent surgery or immobilization makes this less likely, but it should be considered in the absence of a clear diagnosis and with appropriate clinical suspicion.  - Acute coronary syndrome (ACS): Although the pain description and location are not typical for  ACS, it cannot be entirely ruled out without further cardiac evaluation, especially considering the patient's risk factors (hypertension, hyperlipidemia, obesity). The absence of associated symptoms like diaphoresis, nausea, or radiation to the arm or jaw makes this less likely.  - Aortic dissection: This is a less likely diagnosis given the patient's age and the absence of reported risk factors such as connective tissue disease or a history of hypertension. However, it is a life-threatening condition that can present with sharp, radiating pain and should be considered in the differential until ruled out by appropriate imaging.  This case requires further diagnostic evaluation, including physical examination focused on the areas of pain, diagnostic imaging (e.g., ultrasound for gallbladder disease, chest X-ray for pulmonary processes), and possibly gastroenterology consultation for evaluation of GERD complications or esophageal disorders.  Assessment & Plan  Clinical Problem Representation: A 47 year old female with a complex medical history including anxiety, sleep apnea, rectal vaginal fistula, benign paroxysmal positional vertigo (BPPV), mixed hyperlipidemia, essential hypertension, gastroesophageal reflux disease (GERD), vitamin D deficiency, morbid obesity, leukocytosis, and allergic rhinitis presents with intermittent, sharp pain under the right breast radiating to the right posterior side for about a month. The pain has gradually worsened, becoming more intense and frequent, especially during the weekend and today. Her current medications include albuterol, amlodipine, budesonide-formoterol, multivitamins, sertraline, vitamin D, and valacyclovir (not currently taking).  Differential Diagnosis (DDx): 1. Costochondritis: Considering the location and description of the pain, costochondritis is a likely diagnosis. It is characterized by pain associated with the costal cartilage and can be exacerbated  by physical activity or stress, potentially explaining the increased pain frequency and intensity. 2. Gallbladder disease (e.g., cholecystitis or gallstones): The right-sided pain radiating to the back could also suggest gallbladder pathology, especially given the patient's profile of obesity and hyperlipidemia, which are risk factors. 3. Pulmonary conditions (e.g., pleurisy or pulmonary embolism): Given the  patient's history of sleep apnea and obesity, pulmonary causes should be considered, although the lack of respiratory symptoms makes this less likely. 4. Musculoskeletal strain: Given the patient's obesity and potential for altered posture or strain, a musculoskeletal origin of the pain is possible. 5. GERD-related pain: Although typically central, GERD can sometimes present with right-sided pain, especially if there is esophageal spasm.  Plan:  1. Costochondritis: - Dx: Clinical diagnosis based on history and physical examination, focusing on tenderness at the costosternal junctions. - Tx: Recommend NSAIDs for pain management and advise on gentle stretching exercises. Consider a short course of physical therapy if symptoms persist.  2. Gallbladder Disease: - Dx: Order an abdominal ultrasound to evaluate for gallstones or signs of cholecystitis. Consider a hepatobiliary iminodiacetic acid (HIDA) scan if ultrasound is inconclusive and symptoms persist. - Tx: If gallstones or cholecystitis are confirmed, refer to general surgery for evaluation for cholecystectomy. Advise on a low-fat diet in the interim.  3. Pulmonary Conditions: - Dx: Given the low suspicion, reserve diagnostic imaging such as chest X-ray or CT scan for if new respiratory symptoms develop or if initial evaluations for other causes are negative. - Tx: Symptomatic treatment and close monitoring for the development of any respiratory symptoms.  4. Musculoskeletal Strain: - Dx: Assessment through physical examination for  localized tenderness or pain on movement. - Tx: Recommend rest, application of heat or cold as preferred by the patient, and NSAIDs for pain relief. Physical therapy may be beneficial.  5. GERD-related Pain: - Dx: Consider esophageal manometry and 24-hour pH monitoring if symptoms suggestive of GERD do not respond to empirical treatment. - Tx: Optimize GERD management by ensuring compliance with current medication, reviewing and adjusting medication as necessary, and reinforcing lifestyle modifications.  Monitoring and Follow-Up: - Schedule a follow-up appointment in 2-4 weeks to assess response to interventions and review diagnostic study results. - Advise the patient to seek immediate care if she experiences any new or worsening symptoms, particularly those suggestive of a more urgent condition such as pulmonary embolism (e.g., sudden onset shortness of breath, chest pain).  This comprehensive approach addresses the most likely causes of the patient's symptoms, prioritizes immediate interventions, and outlines a clear follow-up plan to ensure the patient's safety and symptom resolution.  Orders: -     CBC with Differential/Platelet -     Comprehensive metabolic panel -     Troponin I - -     Lipase -     Amylase -     EKG 12-Lead -     DG Chest 2 View; Future -     US ABDOMEN LIMITED RUQ (LIVER/GB); Future -     Ibuprofen; Take 1 tablet (800 mg total) by mouth every 8 (eight) hours as needed.  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  Take some Maalox to evaluate unlikely possibility of gastroesophageal reflux disease and to treat ibuprofen side effect(s). Go to ER if worsening despite this or other new symptom(s).  Printed out the artificial  intelligence analysis for her to reference.  If she feels satisfied its responding well to conservative measures, ok to not complete labs/XR/ultrasound. Important to follow up with Primary Care Provider (PCP) regardless soon 2-4 weeks, she will call to make appointment tomorrow.  Clinical Presentation:   The patient is a 47 y.o. female: Active Ambulatory Problems    Diagnosis Date Noted   Anxiety 11/22/2011   Sleep apnea 04/03/2012   Rectal vaginal fistula 01/31/2013   Benign paroxysmal positional vertigo 08/23/2013   Hyperlipidemia, mixed 03/07/2014   Essential hypertension 09/09/2014   GERD (gastroesophageal reflux disease) 11/08/2015   Vitamin D deficiency 06/27/2010   Morbid obesity ([place]) 06/05/2019   Leukocytosis 08/25/2019   Allergic rhinitis 07/20/2010   Other chest pain 12/24/2022   Resolved Ambulatory Problems    Diagnosis Date Noted   Obesity (BMI 35.0-39.9 without comorbidity)    Headache 06/27/2010   Allergic state 11/22/2011   Visual changes 11/22/2011   HTN (hypertension) 11/22/2011   Sinusitis 11/22/2011   Plantar fasciitis 01/11/2012   Fatigue 01/11/2012   Pharyngitis 01/11/2012   Kidney stone 12/21/2012   Back pain 01/31/2013   Dehydration 01/31/2013   Acute low back pain 07/06/2013   Preventative health care 08/18/2013   Streptococcal sore throat 09/16/2013   Pharyngitis, streptococcal, acute 09/16/2013   Otitis media 02/14/2014   Disequilibrium syndrome 09/09/2014   Maxillary sinusitis, acute 10/21/2015   Retrosternal chest pain 11/08/2015   Bartholin cyst 03/01/2013   BP (high blood pressure) 02/23/2013   Obstructive apnea 02/23/2013   Dizziness 12/29/2015   Other headache syndrome 12/29/2015   Generalized abdominal pain 02/16/2016   Elevated blood-pressure reading without diagnosis of hypertension 06/27/2010   Herpes zoster 12/18/2010   Past Medical History:  Diagnosis Date   BPV (benign positional vertigo) 08/23/2013   Chicken pox as a  child   Hypertension    Other and unspecified hyperlipidemia 03/07/2014    Outpatient Medications Prior to Visit  Medication Sig   albuterol (VENTOLIN HFA) 108 (90 Base) MCG/ACT inhaler Inhale 2 puffs into the lungs every 6 (six) hours as needed for wheezing or shortness of breath.   amLODipine (NORVASC) 5 MG tablet TAKE 1 TABLET(5 MG) BY MOUTH DAILY   budesonide-formoterol (SYMBICORT) 160-4.5 MCG/ACT inhaler Inhale 2 puffs into the lungs in the morning and at bedtime.   Multiple Vitamin (MULTI-VITAMINS) TABS Take by mouth.   sertraline (ZOLOFT) 50 MG tablet Take 1 tablet (50 mg total) by mouth daily.   Vitamin D, Ergocalciferol, (DRISDOL) 1.25 MG (50000 UNIT) CAPS capsule Take 1 capsule (50,000 Units total) by mouth every 7 (seven) days.   valACYclovir (VALTREX) 500 MG tablet Take by mouth. (Patient not taking: Reported on 12/24/2022)   No facility-administered medications prior to visit.     Chief Complaint  Patient presents with   Pain under right breast    Intermittent, sharp pain radiates to right posterior side. For about a month and has gradually gotten worse, more intense and frequently during the weekend and today.   Bad over this weekend The pain in the back is kind of like shooting through from the chest to the back    Review of Systems  Constitutional:  Negative for chills, diaphoresis, fever, malaise/fatigue and weight loss.  HENT:  Negative for congestion, ear discharge, ear pain, hearing loss, nosebleeds, sinus pain, sore throat and tinnitus.   Eyes:  Negative for blurred vision, double vision, photophobia, pain, discharge and redness.  Respiratory:  Negative for cough, hemoptysis, sputum production, shortness of breath, wheezing and stridor.   Cardiovascular:  Positive for chest pain (comes and goes, more so over the weekend). Negative for palpitations, orthopnea, claudication, leg swelling and PND.  Gastrointestinal:  Negative for abdominal pain, blood in stool,  constipation, diarrhea, heartburn, melena, nausea and vomiting.  Genitourinary:  Negative for dysuria, flank pain, frequency, hematuria and urgency.  Musculoskeletal:  Positive for back pain. Negative for falls, joint pain, myalgias and neck pain.  Skin:  Negative for itching and rash.  Neurological:  Negative for dizziness, tingling, tremors, sensory change, speech change, focal weakness, seizures, loss of consciousness, weakness and headaches.  Endo/Heme/Allergies:  Negative for environmental allergies and polydipsia. Does not bruise/bleed easily.  Psychiatric/Behavioral:  Negative for depression, hallucinations, memory loss, substance abuse and suicidal ideas. The patient is not nervous/anxious and does not have insomnia.         Clinical Data Analysis:  Physical Exam  BP 126/88 (BP Location: Left Arm, Patient Position: Sitting)   Pulse 76   Temp 98 F (36.7 C) (Temporal)   Ht 5' 5.25" (1.657 m)   Wt 215 lb 6.4 oz (97.7 kg)   SpO2 97%   BMI 35.57 kg/m  Wt Readings from Last 10 Encounters:  12/24/22 215 lb 6.4 oz (97.7 kg)  11/05/22 213 lb (96.6 kg)  05/21/22 221 lb (100.2 kg)  04/09/22 222 lb 12.8 oz (101.1 kg)  02/26/22 222 lb (100.7 kg)  02/12/22 220 lb (99.8 kg)  11/15/21 219 lb (99.3 kg)  05/11/21 225 lb (102.1 kg)  09/08/20 223 lb (101.2 kg)  05/06/20 221 lb (100.2 kg)  Vital signs reviewed.  Nursing notes reviewed. Weight trend reviewed. Abnormalities noted: Body mass index is 35.57 kg/m. Obese  by BMI criteria is noted but in my medical opinion, BMI is an unreliable indicator of healthy body composition due to its inability to reflect lean muscle mass.  General Appearance:  Well developed, well nourished female in no acute distress.   Pulmonary:  Normal work of breathing at rest, no respiratory distress apparent. SpO2: 97 % no cough.  Able to complete long sentences and laugh Musculoskeletal: All extremities are intact.  Neurological:  Awake, alert. No obvious focal  neurological deficits or cognitive impairments.  Sensorium seems unclouded. Psychiatric:  Appropriate mood, pleasant demeanor Problem-specific findings:  s1 and s2 heart sounds have regular rate and rhythm.    Results Reviewed: (reviewed labs/imaging may be also be found in the assessment / plan section):  No results found for any visits on 12/24/22.  Recent Results (from the past 2160 hour(s))  Vitamin D (25 hydroxy)     Status: None   Collection Time: 11/05/22  2:46 PM  Result Value Ref Range   Vit D, 25-Hydroxy 45 30 - 100 ng/mL    Comment: Vitamin D Status         25-OH Vitamin D: . Deficiency:                    <20 ng/mL Insufficiency:             20 - 29 ng/mL Optimal:                 > or = 30 ng/mL . For 25-OH Vitamin D testing on patients on  D2-supplementation and patients for whom quantitation  of D2 and D3 fractions is required, the [facility](TM) 25-OH VIT D, (D2,D3), LC/MS/MS is recommended: order  code [phone] (patients >41yr). . See Note 1 . Note 1 . For additional information, please refer to  http://education.[facility].com/faq/FAQ199  (This link is being provided for informational/ educational purposes only.)         Signed: RLoralee Pacas MD 12/24/2022 6:39 PM

## 2022-12-24 NOTE — Patient Instructions (Addendum)
To enhance the efficiency and accuracy of your care plan, I've utilized a secure artificial intelligence (AI) tool to analyze your recent exam findings and lab results. This AI tool helps identify trends and personalize treatment pathways based on established medical guidelines. Importantly, no personally identifiable information was shared with the AI.  I have thoroughly reviewed the AI-generated analysis and agree with its recommendations.  This analysis is included in your MyChart app for your reference.  If you have any questions or require further explanation of the analysis or plan, please don't hesitate to schedule an appointment for a more in-depth discussion.  Other chest pain Patients chart review and interview were used to generate a prompt for artificial intelligence analysis ([facility] artificial intelligence) clinical decision support.  AI output was reviewed and is provided in red:   Comprehensive Review of the Case: A 47 year old female with a complex medical history including anxiety, sleep apnea, rectal vaginal fistula, benign paroxysmal positional vertigo, mixed hyperlipidemia, essential hypertension, GERD, vitamin D deficiency, morbid obesity, leukocytosis, and allergic rhinitis presents with intermittent, sharp pain under the right breast radiating to the right posterior side for about a month. The pain has gradually worsened, becoming more intense and frequent, especially during the weekend and today. Her past medical history is significant for allergic state, back pain, Bartholin cyst, BPV, chicken pox in childhood, headache, herpes zoster, hypertension, kidney stone, plantar fasciitis, retrosternal chest pain, sinusitis, sleep apnea, and visual changes. Her current medications include albuterol, amlodipine, budesonide-formoterol, multivitamins, sertraline, vitamin D (ergocalciferol), and valacyclovir (not currently taking). Her blood pressure is 126/88 mmHg. The summary does not  provide specific details on physical exam findings beyond vitals, diagnostic study results, or initial treatments and their responses, which are crucial for a comprehensive assessment.  Clinical Problem Representation: A 47 year old female with a history of multiple comorbidities including anxiety, sleep apnea, and morbid obesity, presents with a month-long history of intermittent, sharp pain under the right breast radiating to the right posterior side, which has been worsening in intensity and frequency. The patient's complex medical background and current medication regimen suggest a multifactorial etiology that may involve musculoskeletal, gastrointestinal, or cardiopulmonary systems.   Most Likely Diagnoses: - Costochondritis: This diagnosis is considered due to the location and description of the pain (sharp, under the right breast, radiating posteriorly), which could be consistent with inflammation of the costal cartilage. The patient's obesity and history of physical strain or unusual activities could contribute to this condition. However, the absence of physical examination findings, such as tenderness over the costochondral junctions, makes this diagnosis less certain without further evaluation.  - Gastroesophageal Reflux Disease (GERD) with possible esophageal spasm: Given the patient's history of GERD, the pain described could be related to acid reflux or esophageal spasms, which can present as sharp pain and may radiate. The worsening of symptoms during the weekend could correlate with dietary changes or increased stress. However, the typical presentation of GERD involves more burning pain, and esophageal spasms usually have a different character of pain.  - Gallbladder disease (e.g., cholecystitis or biliary colic): The location of the pain could also suggest gallbladder disease, especially if it is related to meal intake or specific types of food. The patient's demographic (age and obesity)  increases the risk for gallstones and gallbladder disease. However, the lack of information on the relationship of pain with meals, as well as the absence of other symptoms such as nausea, vomiting, or fever, makes this diagnosis less certain without further diagnostic imaging.  Expanded Differential Diagnoses: - Pleuritic pain: This could be secondary to a pulmonary process such as a pleural effusion or pneumothorax, especially given the sharp nature of the pain and its radiation to the posterior side. However, the absence of respiratory symptoms like shortness of breath or cough, and the lack of physical exam findings or imaging results, limits the ability to fully assess this possibility.  - Musculoskeletal strain: Given the patient's obesity and potential for altered biomechanics, a musculoskeletal strain or sprain could present with sharp, radiating pain. This diagnosis would be supported by a history of recent physical activity or trauma, but the lack of specific examination findings or history details makes this less certain.  - Herpes zoster (Shingles): Although less likely without a rash or dermatomal distribution of pain, herpes zoster could present with sharp, radiating pain before the appearance of the rash. The patient's history of herpes zoster increases the risk of recurrence. However, the typical presentation involves a vesicular rash, which has not been reported.   Can't Miss Differential Diagnosis: - Pulmonary embolism (PE): While not the most likely diagnosis given the presentation, it is a critical "can't miss" diagnosis due to its potential for rapid deterioration and fatality. The patient's obesity and potentially sedentary lifestyle due to sleep apnea and other comorbidities increase her risk for venous thromboembolism. The absence of respiratory symptoms or risk factors such as recent surgery or immobilization makes this less likely, but it should be considered in the absence of a  clear diagnosis and with appropriate clinical suspicion.  - Acute coronary syndrome (ACS): Although the pain description and location are not typical for ACS, it cannot be entirely ruled out without further cardiac evaluation, especially considering the patient's risk factors (hypertension, hyperlipidemia, obesity). The absence of associated symptoms like diaphoresis, nausea, or radiation to the arm or jaw makes this less likely.  - Aortic dissection: This is a less likely diagnosis given the patient's age and the absence of reported risk factors such as connective tissue disease or a history of hypertension. However, it is a life-threatening condition that can present with sharp, radiating pain and should be considered in the differential until ruled out by appropriate imaging.  This case requires further diagnostic evaluation, including physical examination focused on the areas of pain, diagnostic imaging (e.g., ultrasound for gallbladder disease, chest X-ray for pulmonary processes), and possibly gastroenterology consultation for evaluation of GERD complications or esophageal disorders.  Assessment & Plan  Clinical Problem Representation: A 47 year old female with a complex medical history including anxiety, sleep apnea, rectal vaginal fistula, benign paroxysmal positional vertigo (BPPV), mixed hyperlipidemia, essential hypertension, gastroesophageal reflux disease (GERD), vitamin D deficiency, morbid obesity, leukocytosis, and allergic rhinitis presents with intermittent, sharp pain under the right breast radiating to the right posterior side for about a month. The pain has gradually worsened, becoming more intense and frequent, especially during the weekend and today. Her current medications include albuterol, amlodipine, budesonide-formoterol, multivitamins, sertraline, vitamin D, and valacyclovir (not currently taking).  Differential Diagnosis (DDx): 1. Costochondritis: Considering the location  and description of the pain, costochondritis is a likely diagnosis. It is characterized by pain associated with the costal cartilage and can be exacerbated by physical activity or stress, potentially explaining the increased pain frequency and intensity. 2. Gallbladder disease (e.g., cholecystitis or gallstones): The right-sided pain radiating to the back could also suggest gallbladder pathology, especially given the patient's profile of obesity and hyperlipidemia, which are risk factors. 3. Pulmonary conditions (e.g., pleurisy or pulmonary embolism): Given the  patient's history of sleep apnea and obesity, pulmonary causes should be considered, although the lack of respiratory symptoms makes this less likely. 4. Musculoskeletal strain: Given the patient's obesity and potential for altered posture or strain, a musculoskeletal origin of the pain is possible. 5. GERD-related pain: Although typically central, GERD can sometimes present with right-sided pain, especially if there is esophageal spasm.  Plan:  1. Costochondritis: - Dx: Clinical diagnosis based on history and physical examination, focusing on tenderness at the costosternal junctions. - Tx: Recommend NSAIDs for pain management and advise on gentle stretching exercises. Consider a short course of physical therapy if symptoms persist.  2. Gallbladder Disease: - Dx: Order an abdominal ultrasound to evaluate for gallstones or signs of cholecystitis. Consider a hepatobiliary iminodiacetic acid (HIDA) scan if ultrasound is inconclusive and symptoms persist. - Tx: If gallstones or cholecystitis are confirmed, refer to general surgery for evaluation for cholecystectomy. Advise on a low-fat diet in the interim.  3. Pulmonary Conditions: - Dx: Given the low suspicion, reserve diagnostic imaging such as chest X-ray or CT scan for if new respiratory symptoms develop or if initial evaluations for other causes are negative. - Tx: Symptomatic treatment and  close monitoring for the development of any respiratory symptoms.  4. Musculoskeletal Strain: - Dx: Assessment through physical examination for localized tenderness or pain on movement. - Tx: Recommend rest, application of heat or cold as preferred by the patient, and NSAIDs for pain relief. Physical therapy may be beneficial.  5. GERD-related Pain: - Dx: Consider esophageal manometry and 24-hour pH monitoring if symptoms suggestive of GERD do not respond to empirical treatment. - Tx: Optimize GERD management by ensuring compliance with current medication, reviewing and adjusting medication as necessary, and reinforcing lifestyle modifications.  Monitoring and Follow-Up: - Schedule a follow-up appointment in 2-4 weeks to assess response to interventions and review diagnostic study results. - Advise the patient to seek immediate care if she experiences any new or worsening symptoms, particularly those suggestive of a more urgent condition such as pulmonary embolism (e.g., sudden onset shortness of breath, chest pain).  This comprehensive approach addresses the most likely causes of the patient's symptoms, prioritizes immediate interventions, and outlines a clear follow-up plan to ensure the patient's safety and symptom resolution.

## 2022-12-24 NOTE — Assessment & Plan Note (Addendum)
Performed above EKG EKG: normal sinus rhythm with rate 58, normal axis, normal  intervals, left atrial  hypertrophy, nonspecific probably normal twi in v1/v2   Patients chart review and interview were used to generate a prompt for artificial intelligence analysis ([facility] artificial intelligence) clinical decision support.  AI output was reviewed and is provided in red:   Comprehensive Review of the Case: A 47 year old female with a complex medical history including anxiety, sleep apnea, rectal vaginal fistula, benign paroxysmal positional vertigo, mixed hyperlipidemia, essential hypertension, GERD, vitamin D deficiency, morbid obesity, leukocytosis, and allergic rhinitis presents with intermittent, sharp pain under the right breast radiating to the right posterior side for about a month. The pain has gradually worsened, becoming more intense and frequent, especially during the weekend and today. Her past medical history is significant for allergic state, back pain, Bartholin cyst, BPV, chicken pox in childhood, headache, herpes zoster, hypertension, kidney stone, plantar fasciitis, retrosternal chest pain, sinusitis, sleep apnea, and visual changes. Her current medications include albuterol, amlodipine, budesonide-formoterol, multivitamins, sertraline, vitamin D (ergocalciferol), and valacyclovir (not currently taking). Her blood pressure is 126/88 mmHg. The summary does not provide specific details on physical exam findings beyond vitals, diagnostic study results, or initial treatments and their responses, which are crucial for a comprehensive assessment.  Clinical Problem Representation: A 47 year old female with a history of multiple comorbidities including anxiety, sleep apnea, and morbid obesity, presents with a month-long history of intermittent, sharp pain under the right breast radiating to the right posterior side, which has been worsening in intensity and frequency. The patient's complex  medical background and current medication regimen suggest a multifactorial etiology that may involve musculoskeletal, gastrointestinal, or cardiopulmonary systems.   Most Likely Diagnoses: - Costochondritis: This diagnosis is considered due to the location and description of the pain (sharp, under the right breast, radiating posteriorly), which could be consistent with inflammation of the costal cartilage. The patient's obesity and history of physical strain or unusual activities could contribute to this condition. However, the absence of physical examination findings, such as tenderness over the costochondral junctions, makes this diagnosis less certain without further evaluation.  - Gastroesophageal Reflux Disease (GERD) with possible esophageal spasm: Given the patient's history of GERD, the pain described could be related to acid reflux or esophageal spasms, which can present as sharp pain and may radiate. The worsening of symptoms during the weekend could correlate with dietary changes or increased stress. However, the typical presentation of GERD involves more burning pain, and esophageal spasms usually have a different character of pain.  - Gallbladder disease (e.g., cholecystitis or biliary colic): The location of the pain could also suggest gallbladder disease, especially if it is related to meal intake or specific types of food. The patient's demographic (age and obesity) increases the risk for gallstones and gallbladder disease. However, the lack of information on the relationship of pain with meals, as well as the absence of other symptoms such as nausea, vomiting, or fever, makes this diagnosis less certain without further diagnostic imaging.   Expanded Differential Diagnoses: - Pleuritic pain: This could be secondary to a pulmonary process such as a pleural effusion or pneumothorax, especially given the sharp nature of the pain and its radiation to the posterior side. However, the absence of  respiratory symptoms like shortness of breath or cough, and the lack of physical exam findings or imaging results, limits the ability to fully assess this possibility.  - Musculoskeletal strain: Given the patient's obesity and potential for altered biomechanics, a  musculoskeletal strain or sprain could present with sharp, radiating pain. This diagnosis would be supported by a history of recent physical activity or trauma, but the lack of specific examination findings or history details makes this less certain.  - Herpes zoster (Shingles): Although less likely without a rash or dermatomal distribution of pain, herpes zoster could present with sharp, radiating pain before the appearance of the rash. The patient's history of herpes zoster increases the risk of recurrence. However, the typical presentation involves a vesicular rash, which has not been reported.   Can't Miss Differential Diagnosis: - Pulmonary embolism (PE): While not the most likely diagnosis given the presentation, it is a critical "can't miss" diagnosis due to its potential for rapid deterioration and fatality. The patient's obesity and potentially sedentary lifestyle due to sleep apnea and other comorbidities increase her risk for venous thromboembolism. The absence of respiratory symptoms or risk factors such as recent surgery or immobilization makes this less likely, but it should be considered in the absence of a clear diagnosis and with appropriate clinical suspicion.  - Acute coronary syndrome (ACS): Although the pain description and location are not typical for ACS, it cannot be entirely ruled out without further cardiac evaluation, especially considering the patient's risk factors (hypertension, hyperlipidemia, obesity). The absence of associated symptoms like diaphoresis, nausea, or radiation to the arm or jaw makes this less likely.  - Aortic dissection: This is a less likely diagnosis given the patient's age and the absence of  reported risk factors such as connective tissue disease or a history of hypertension. However, it is a life-threatening condition that can present with sharp, radiating pain and should be considered in the differential until ruled out by appropriate imaging.  This case requires further diagnostic evaluation, including physical examination focused on the areas of pain, diagnostic imaging (e.g., ultrasound for gallbladder disease, chest X-ray for pulmonary processes), and possibly gastroenterology consultation for evaluation of GERD complications or esophageal disorders.  Assessment & Plan  Clinical Problem Representation: A 47 year old female with a complex medical history including anxiety, sleep apnea, rectal vaginal fistula, benign paroxysmal positional vertigo (BPPV), mixed hyperlipidemia, essential hypertension, gastroesophageal reflux disease (GERD), vitamin D deficiency, morbid obesity, leukocytosis, and allergic rhinitis presents with intermittent, sharp pain under the right breast radiating to the right posterior side for about a month. The pain has gradually worsened, becoming more intense and frequent, especially during the weekend and today. Her current medications include albuterol, amlodipine, budesonide-formoterol, multivitamins, sertraline, vitamin D, and valacyclovir (not currently taking).  Differential Diagnosis (DDx): 1. Costochondritis: Considering the location and description of the pain, costochondritis is a likely diagnosis. It is characterized by pain associated with the costal cartilage and can be exacerbated by physical activity or stress, potentially explaining the increased pain frequency and intensity. 2. Gallbladder disease (e.g., cholecystitis or gallstones): The right-sided pain radiating to the back could also suggest gallbladder pathology, especially given the patient's profile of obesity and hyperlipidemia, which are risk factors. 3. Pulmonary conditions (e.g., pleurisy  or pulmonary embolism): Given the patient's history of sleep apnea and obesity, pulmonary causes should be considered, although the lack of respiratory symptoms makes this less likely. 4. Musculoskeletal strain: Given the patient's obesity and potential for altered posture or strain, a musculoskeletal origin of the pain is possible. 5. GERD-related pain: Although typically central, GERD can sometimes present with right-sided pain, especially if there is esophageal spasm.  Plan:  1. Costochondritis: - Dx: Clinical diagnosis based on history and physical examination, focusing on  tenderness at the costosternal junctions. - Tx: Recommend NSAIDs for pain management and advise on gentle stretching exercises. Consider a short course of physical therapy if symptoms persist.  2. Gallbladder Disease: - Dx: Order an abdominal ultrasound to evaluate for gallstones or signs of cholecystitis. Consider a hepatobiliary iminodiacetic acid (HIDA) scan if ultrasound is inconclusive and symptoms persist. - Tx: If gallstones or cholecystitis are confirmed, refer to general surgery for evaluation for cholecystectomy. Advise on a low-fat diet in the interim.  3. Pulmonary Conditions: - Dx: Given the low suspicion, reserve diagnostic imaging such as chest X-ray or CT scan for if new respiratory symptoms develop or if initial evaluations for other causes are negative. - Tx: Symptomatic treatment and close monitoring for the development of any respiratory symptoms.  4. Musculoskeletal Strain: - Dx: Assessment through physical examination for localized tenderness or pain on movement. - Tx: Recommend rest, application of heat or cold as preferred by the patient, and NSAIDs for pain relief. Physical therapy may be beneficial.  5. GERD-related Pain: - Dx: Consider esophageal manometry and 24-hour pH monitoring if symptoms suggestive of GERD do not respond to empirical treatment. - Tx: Optimize GERD management by ensuring  compliance with current medication, reviewing and adjusting medication as necessary, and reinforcing lifestyle modifications.  Monitoring and Follow-Up: - Schedule a follow-up appointment in 2-4 weeks to assess response to interventions and review diagnostic study results. - Advise the patient to seek immediate care if she experiences any new or worsening symptoms, particularly those suggestive of a more urgent condition such as pulmonary embolism (e.g., sudden onset shortness of breath, chest pain).  This comprehensive approach addresses the most likely causes of the patient's symptoms, prioritizes immediate interventions, and outlines a clear follow-up plan to ensure the patient's safety and symptom resolution.

## 2022-12-24 NOTE — Telephone Encounter (Signed)
\  [facility] - Client Nonclinical Telephone Record  [facility] - Client Client Site [place] - Day Provider Raoul Pitch, [place] Type Call Who Is Calling Patient / Member / Family / Caregiver Caller Name Carol Sidney Caller Phone Number [phone] Patient Name Erika Weaver Patient DOB November 18, 1975 Call Type Message Only Information Provided Reason for Call Request to Schedule Office Appointment Initial Comment The caller states she has a pain under right breast that radiates into her back. and would like to make an appt. Disp. Time Disposition Final User 12/24/2022 12:15:25 PM General Information Provided Yes Quitman Livings Call Closed By: Quitman Livings Transaction Date/Time: 12/24/2022 12:12:39 PM (ET)

## 2022-12-24 NOTE — Telephone Encounter (Signed)
Pt is schedule at [facility] [address]

## 2022-12-25 ENCOUNTER — Other Ambulatory Visit (INDEPENDENT_AMBULATORY_CARE_PROVIDER_SITE_OTHER): Payer: 59

## 2022-12-25 DIAGNOSIS — R0789 Other chest pain: Secondary | ICD-10-CM

## 2022-12-25 LAB — COMPREHENSIVE METABOLIC PANEL
ALT: 16 U/L (ref 0–35)
AST: 15 U/L (ref 0–37)
Albumin: 3.5 g/dL (ref 3.5–5.2)
Alkaline Phosphatase: 75 U/L (ref 39–117)
BUN: 13 mg/dL (ref 6–23)
CO2: 29 mEq/L (ref 19–32)
Calcium: 9.5 mg/dL (ref 8.4–10.5)
Chloride: 102 mEq/L (ref 96–112)
Creatinine, Ser: 0.82 mg/dL (ref 0.40–1.20)
GFR: 84.64 mL/min (ref >60.00)
Glucose, Bld: 90 mg/dL (ref 70–99)
Potassium: 4.2 mEq/L (ref 3.5–5.1)
Sodium: 139 mEq/L (ref 135–145)
Total Bilirubin: 0.6 mg/dL (ref 0.2–1.2)
Total Protein: 6.9 g/dL (ref 6.0–8.3)

## 2022-12-25 LAB — CBC WITH DIFFERENTIAL/PLATELET
Basophils Absolute: 0.1 10*3/uL (ref 0.0–0.1)
Basophils Relative: 1.2 % (ref 0.0–3.0)
Eosinophils Absolute: 0.3 10*3/uL (ref 0.0–0.7)
Eosinophils Relative: 3.5 % (ref 0.0–5.0)
HCT: 40.9 % (ref 36.0–46.0)
Hemoglobin: 13.8 g/dL (ref 12.0–15.0)
Lymphocytes Relative: 27 % (ref 12.0–46.0)
Lymphs Abs: 2.2 10*3/uL (ref 0.7–4.0)
MCHC: 33.7 g/dL (ref 30.0–36.0)
MCV: 90.3 fl (ref 78.0–100.0)
Monocytes Absolute: 0.7 10*3/uL (ref 0.1–1.0)
Monocytes Relative: 8.1 % (ref 3.0–12.0)
Neutro Abs: 4.9 10*3/uL (ref 1.4–7.7)
Neutrophils Relative %: 60.2 % (ref 43.0–77.0)
Platelets: 314 10*3/uL (ref 150.0–400.0)
RBC: 4.54 Mil/uL (ref 3.87–5.11)
RDW: 13.1 % (ref 11.5–15.5)
WBC: 8.1 10*3/uL (ref 4.0–10.5)

## 2022-12-25 LAB — LIPASE: Lipase: 98 U/L — ABNORMAL HIGH (ref 11.0–59.0)

## 2022-12-25 LAB — AMYLASE: Amylase: 67 U/L (ref 27–131)

## 2022-12-25 NOTE — Progress Notes (Signed)
Notify that these all essentially normal... although lipase slightly up suggesting maybe there is a gall stone causing the problem.

## 2022-12-25 NOTE — Addendum Note (Signed)
Addended by: Joanette Gula on: 12/25/2022 08:17 AM   Modules accepted: Orders

## 2022-12-26 LAB — TROPONIN I: Troponin I: <3 ng/L (ref <=47)

## 2022-12-27 NOTE — Progress Notes (Signed)
It took a while to return but this just confirms that your chest pain was not associated with any cardiac damage or cell death.   You did have t wave inversion(s) in v1/v2 but I think this is normal variant.  That said - if your pain is persisting -cardiologist might do stress test if you are interested.

## 2022-12-31 ENCOUNTER — Telehealth: Payer: Self-pay | Admitting: Family Medicine

## 2022-12-31 ENCOUNTER — Telehealth: Payer: Self-pay | Admitting: Pulmonary Disease

## 2022-12-31 DIAGNOSIS — G4733 Obstructive sleep apnea (adult) (pediatric): Secondary | ICD-10-CM

## 2022-12-31 NOTE — Telephone Encounter (Signed)
Pt states the order that was sent did not have everything that the order needed. It should say - abdominal complete, it will do the gallbladder, liver, pancreas and spleen - Please advise.

## 2022-12-31 NOTE — Telephone Encounter (Signed)
Patient would like order for full face mask for CPAP machine. Patient uses [facility] for CPAP machine. Patient phone number is [phone].

## 2023-01-01 NOTE — Telephone Encounter (Signed)
Called and spoke with patient. She stated that she received her cpap machine from [place] but has been struggling with the mask. She wants to try the full face mask instead of the nasal pillow. I advised her I would go ahead and place the order. She verbalized understanding.   Nothing further needed at time of call.

## 2023-01-04 ENCOUNTER — Telehealth: Payer: Self-pay | Admitting: Family Medicine

## 2023-01-04 NOTE — Telephone Encounter (Signed)
Pt called back again for the information that was sent before. Please advise.

## 2023-01-04 NOTE — Telephone Encounter (Signed)
Patient requests to be called re: Ultrasound Order

## 2023-01-07 ENCOUNTER — Other Ambulatory Visit: Payer: Self-pay

## 2023-01-07 DIAGNOSIS — R1011 Right upper quadrant pain: Secondary | ICD-10-CM

## 2023-01-08 ENCOUNTER — Other Ambulatory Visit: Payer: Self-pay | Admitting: Internal Medicine

## 2023-01-08 DIAGNOSIS — R1011 Right upper quadrant pain: Secondary | ICD-10-CM

## 2023-02-07 ENCOUNTER — Ambulatory Visit
Admission: RE | Admit: 2023-02-07 | Discharge: 2023-02-07 | Disposition: A | Discharge status: Home / Self Care | Payer: 59 | Source: Ambulatory Visit | Attending: Internal Medicine | Admitting: Internal Medicine

## 2023-02-07 ENCOUNTER — Encounter: Payer: Self-pay | Admitting: Internal Medicine

## 2023-02-07 DIAGNOSIS — K760 Fatty (change of) liver, not elsewhere classified: Secondary | ICD-10-CM | POA: Insufficient documentation

## 2023-02-07 DIAGNOSIS — R1011 Right upper quadrant pain: Secondary | ICD-10-CM

## 2023-02-07 NOTE — Progress Notes (Signed)
We did this ultrasound for right upper quadrant abdomen vs R lower chest pain.  It found incidental finding of  steatosis that I added to the problem list, but no gallbladder issue that might explain the pain. If the pain is persisting and related to meals it might still be a gallbladder issue.  Please notify patient I encourage her to discuss both the pain and the steatosis (fatty liver) finding in her planned follow up with Dr. Claiborne Billings.

## 2023-02-12 ENCOUNTER — Telehealth: Payer: Self-pay | Admitting: Family Medicine

## 2023-02-12 NOTE — Telephone Encounter (Signed)
Pt would like a call back with imaging results 

## 2023-02-13 NOTE — Telephone Encounter (Signed)
Pt would like call back asap with [place] results

## 2023-02-14 ENCOUNTER — Telehealth: Payer: Self-pay | Admitting: Family Medicine

## 2023-02-14 NOTE — Telephone Encounter (Signed)
Erika Weaver was seen at Dr. Jon Billings office back in march and had an ultra sound. She has called their office everyday this week and have yet to have the results explained to her.  She is asking if Dr. Claiborne Billings can read the results and someone give her a call to let her know what the results were. She has not had any luck with Dr. Jon Billings office.

## 2023-02-14 NOTE — Telephone Encounter (Signed)
Spoke with patient regarding results/recommendations.  

## 2023-02-15 NOTE — Telephone Encounter (Signed)
Spoke with pt, is aware of results and is encouraged to discuss with pcp at future scheduled visit.

## 2023-05-15 ENCOUNTER — Other Ambulatory Visit: Payer: Self-pay | Admitting: Family Medicine

## 2023-05-21 ENCOUNTER — Other Ambulatory Visit: Payer: Self-pay | Admitting: Family Medicine

## 2023-05-21 DIAGNOSIS — Z1231 Encounter for screening mammogram for malignant neoplasm of breast: Secondary | ICD-10-CM

## 2023-05-23 ENCOUNTER — Encounter: Payer: 59 | Admitting: Family Medicine

## 2023-06-10 ENCOUNTER — Encounter: Payer: 59 | Admitting: Family Medicine

## 2023-06-13 ENCOUNTER — Ambulatory Visit
Admission: RE | Admit: 2023-06-13 | Discharge: 2023-06-13 | Disposition: A | Discharge status: Home / Self Care | Payer: 59 | Source: Ambulatory Visit | Attending: Family Medicine | Admitting: Family Medicine

## 2023-06-13 DIAGNOSIS — Z1231 Encounter for screening mammogram for malignant neoplasm of breast: Secondary | ICD-10-CM

## 2023-07-17 ENCOUNTER — Ambulatory Visit (INDEPENDENT_AMBULATORY_CARE_PROVIDER_SITE_OTHER): Payer: 59 | Admitting: Family Medicine

## 2023-07-17 ENCOUNTER — Encounter: Payer: Self-pay | Admitting: Family Medicine

## 2023-07-17 VITALS — BP 125/79 | HR 73 | Temp 97.8°F | Ht 65.0 in | Wt 230.8 lb

## 2023-07-17 DIAGNOSIS — Z131 Encounter for screening for diabetes mellitus: Secondary | ICD-10-CM | POA: Diagnosis not present

## 2023-07-17 DIAGNOSIS — Z0000 Encounter for general adult medical examination without abnormal findings: Secondary | ICD-10-CM

## 2023-07-17 DIAGNOSIS — I10 Essential (primary) hypertension: Secondary | ICD-10-CM | POA: Diagnosis not present

## 2023-07-17 DIAGNOSIS — F419 Anxiety disorder, unspecified: Secondary | ICD-10-CM

## 2023-07-17 DIAGNOSIS — Z23 Encounter for immunization: Secondary | ICD-10-CM | POA: Diagnosis not present

## 2023-07-17 DIAGNOSIS — E782 Mixed hyperlipidemia: Secondary | ICD-10-CM

## 2023-07-17 DIAGNOSIS — Z1231 Encounter for screening mammogram for malignant neoplasm of breast: Secondary | ICD-10-CM | POA: Diagnosis not present

## 2023-07-17 DIAGNOSIS — E559 Vitamin D deficiency, unspecified: Secondary | ICD-10-CM

## 2023-07-17 MED ORDER — SERTRALINE HCL 50 MG PO TABS: 50.0000 mg | ORAL_TABLET | Freq: Every day | ORAL | 1 refills | Status: DC

## 2023-07-17 MED ORDER — AMLODIPINE BESYLATE 5 MG PO TABS: ORAL_TABLET | ORAL | 1 refills | Status: DC

## 2023-07-17 NOTE — Progress Notes (Signed)
Patient ID: Erika Weaver, female  DOB: 1976/01/25, 47 y.o.   MRN: [id number] Patient Care Team    Relationship Specialty Notifications Start End  Natalia Leatherwood, DO PCP - General Family Medicine  07/15/19   Richarda Overlie, MD Consulting Physician Obstetrics and Gynecology  08/30/16   Josph Macho, MD Consulting Physician Oncology  08/25/19   Octavia Bruckner, MD Referring Physician Internal Medicine  08/25/19   Runell Gess, MD Consulting Physician Cardiology  08/25/19   Meryl Dare, MD Consulting Physician Gastroenterology  08/25/19     Chief Complaint  Patient presents with   Annual Exam    Pt is not fasting    Subjective:  Erika Weaver is a 47 y.o.  Female  present for CPE and Chronic Conditions/illness Management All past medical history, surgical history, allergies, family history, immunizations, medications and social history were updated in the electronic medical record today. All recent labs, ED visits and hospitalizations within the last year were reviewed.  Health maintenance:  Colonoscopy: MGM colon cancer in her 56s. Pt elected cologuard screening> 03/2021 normal, next 2025 Mammogram: completed:06/13/2023,[facility]> ordered for next year. Cervical cancer screening: last pap: 09/21/2018 with gyn.> she will call them.  Immunizations: tdap administered, Influenza administered today (encouraged yearly) Infectious disease screening: HIV completed, Hep C completed today DEXA:routine screen Assistive device: none Oxygen JYN:[facility] Patient has a Dental home. Hospitalizations/ED visits:reviewed  Essential hypertension/Hyperlipidemia, mixed/Morbid obesity (HCC) Pt reports compliance with amlodipine 5 mg daily.  Patient denies chest pain, shortness of breath, dizziness or lower extremity edema.  Pt does not daily baby ASA. Pt is not prescribed statin.   Anxiety/persistent postural perceptional vertigo Patient reports she was having dizziness and was getting  frustrated secondary to diagnosis unable to be identified.  She reports she still is est. cardiologist and then went on to a neurologist in [place] system.  She reports they diagnosed her w/ PPPV and started her on low-dose Zoloft.   Since that time she has had a great response to the medication with only occasional recurrence.  She is compliant with zoloft 50 mg qd     07/17/2023    1:56 PM 11/05/2022    2:29 PM 02/12/2022    2:03 PM 11/15/2021    3:31 PM 09/08/2020    9:23 AM  Depression screen PHQ 2/9  Decreased Interest 0 0 0 0 0  Down, Depressed, Hopeless 0 0 0 0 0  PHQ - 2 Score 0 0 0 0 0  Altered sleeping 0 0 0 0   Tired, decreased energy 0 0 0 0   Change in appetite 0 0 0 0   Feeling bad or failure about yourself  0 0 0 0   Trouble concentrating 0 0 0 0   Moving slowly or fidgety/restless 0 0 0 0   Suicidal thoughts 0 0 0 0   PHQ-9 Score 0 0 0 0   Difficult doing work/chores Not difficult at all          07/17/2023    1:57 PM 11/05/2022    2:30 PM 02/12/2022    2:06 PM 11/15/2021    3:31 PM  GAD 7 : Generalized Anxiety Score  Nervous, Anxious, on Edge 0 0 0 0  Control/stop worrying 0 0 0 0  Worry too much - different things 0 0 0 0  Trouble relaxing 0 0 0 0  Restless 0 0 0 0  Easily annoyed or irritable 0 0 0  0  Afraid - awful might happen 0  0 0  Total GAD 7 Score 0  0 0     Immunization History  Administered Date(s) Administered   Influenza Split 07/22/2009   Influenza Whole 07/23/2011   Influenza, Seasonal, Injecte, Preservative Fre 07/17/2023   Influenza,inj,Quad PF,6+ Mos 08/18/2013, 12/09/2014, 09/22/2015, 08/30/2016, 09/05/2017, 07/31/2018, 08/25/2019, 09/08/2020, 11/15/2021, 10/10/2022   Influenza,inj,quad, With Preservative 08/22/2017   Influenza-Unspecified 07/27/2011   PFIZER(Purple Top)SARS-COV-2 Vaccination 07/01/2020, 07/22/2020   Tdap 12/16/2009, 08/18/2013, 07/17/2023     Past Medical History:  Diagnosis Date   Allergic state 11/22/2011    Anxiety 11/22/2011   Back pain 01/31/2013   Bartholin cyst 03/01/2013   BPV (benign positional vertigo) 08/23/2013   Chicken pox as a child   Headache 11/22/2011   Overview:  Headache  ICD-10 cut over     Herpes zoster 12/18/2010   Hypertension    Kidney stone 12/21/2012   Other and unspecified hyperlipidemia 03/07/2014   Plantar fasciitis 01/11/2012   Rectal vaginal fistula 01/31/2013   Retrosternal chest pain 11/08/2015   Sinusitis 11/22/2011   Sleep apnea 04/03/2012   setting 2   Visual changes 11/22/2011   Allergies  Allergen Reactions   Topamax [Topiramate]     dizziness   Zonegran [Zonisamide]     dizziness   Past Surgical History:  Procedure Laterality Date   GUM SURGERY     PERINEAL BODY REPAIR     4 th degree tear with fistula between vagina and retum requiring 3 surgeries for correction   rectal fistula repair     x 2   WISDOM TOOTH EXTRACTION     Family History  Problem Relation Age of Onset   Diabetes Mother        type 2   Hypertension Mother    Hyperlipidemia Mother    Hypertension Father    Hyperlipidemia Father    Aneurysm Father        abdominal aorta   Cancer Maternal Grandmother 10       colon, ovarian   Other Maternal Grandfather 41       brain hemorrhage   Stroke Maternal Grandfather    Aneurysm Maternal Grandfather    Heart attack Paternal Grandmother    Heart disease Paternal Grandmother        MI   Alzheimer's disease Paternal Grandfather    Dementia Paternal Grandfather    Heart attack Paternal Aunt    Heart disease Paternal Aunt         AAA rupture   Other Paternal Uncle        cerebreal brain hemorrhage   Stroke Paternal Uncle    Aneurysm Paternal Uncle    Asthma Daughter    Other Daughter        peanut allergy   Heart disease Maternal Aunt        s/p cabg. brain aneurysm s/p coiling   Aneurysm Maternal Aunt        coil behind eye   Social History   Social History Narrative   Marital status/children/pets: 1 child.   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   Safety:      -smoke alarm in the home:Yes     - wears seatbelt: Yes     - Feels safe in their relationships: Yes    Allergies as of 07/17/2023       Reactions   Topamax [topiramate]    dizziness   Zonegran [zonisamide]  dizziness        Medication List        Accurate as of July 17, 2023  2:10 PM. If you have any questions, ask your nurse or doctor.          STOP taking these medications    ibuprofen 800 MG tablet Commonly known as: ADVIL Stopped by: Felix Pacini   valACYclovir 500 MG tablet Commonly known as: VALTREX Stopped by: Felix Pacini       TAKE these medications    albuterol 108 (90 Base) MCG/ACT inhaler Commonly known as: VENTOLIN HFA Inhale 2 puffs into the lungs every 6 (six) hours as needed for wheezing or shortness of breath.   amLODipine 5 MG tablet Commonly known as: NORVASC TAKE 1 TABLET BY MOUTH EVERY DAY   budesonide-formoterol 160-4.5 MCG/ACT inhaler Commonly known as: Symbicort Inhale 2 puffs into the lungs in the morning and at bedtime.   Multi-Vitamins Tabs Take by mouth.   sertraline 50 MG tablet Commonly known as: ZOLOFT Take 1 tablet (50 mg total) by mouth daily.   Vitamin D (Ergocalciferol) 1.25 MG (50000 UNIT) Caps capsule Commonly known as: DRISDOL Take 1 capsule (50,000 Units total) by mouth every 7 (seven) days.        All past medical history, surgical history, allergies, family history, immunizations andmedications were updated in the EMR today and reviewed under the history and medication portions of their EMR.      ROS 14 pt review of systems performed and negative (unless mentioned in an HPI)  Objective: BP 125/79   Pulse 73   Temp 97.8 F (36.6 C)   Ht 5\' 5"  (1.651 m)   Wt 230 lb 12.8 oz (104.7 kg)   LMP 07/16/2023   SpO2 98%   BMI 38.41 kg/m  Physical Exam Vitals and nursing note reviewed.  Constitutional:       General: She is not in acute distress.    Appearance: Normal appearance. She is obese. She is not ill-appearing or toxic-appearing.  HENT:     Head: Normocephalic and atraumatic.     Right Ear: Tympanic membrane, ear canal and external ear normal. There is no impacted cerumen.     Left Ear: Tympanic membrane, ear canal and external ear normal. There is no impacted cerumen.     Nose: No congestion or rhinorrhea.     Mouth/Throat:     Mouth: Mucous membranes are moist.     Pharynx: Oropharynx is clear. No oropharyngeal exudate or posterior oropharyngeal erythema.  Eyes:     General: No scleral icterus.       Right eye: No discharge.        Left eye: No discharge.     Extraocular Movements: Extraocular movements intact.     Conjunctiva/sclera: Conjunctivae normal.     Pupils: Pupils are equal, round, and reactive to light.  Cardiovascular:     Rate and Rhythm: Normal rate and regular rhythm.     Pulses: Normal pulses.     Heart sounds: Normal heart sounds. No murmur heard.    No friction rub. No gallop.  Pulmonary:     Effort: Pulmonary effort is normal. No respiratory distress.     Breath sounds: Normal breath sounds. No stridor. No wheezing, rhonchi or rales.  Chest:     Chest wall: No tenderness.  Abdominal:     General: Abdomen is flat. Bowel sounds are normal. There is no distension.     Palpations: Abdomen is soft.  There is no mass.     Tenderness: There is no abdominal tenderness. There is no right CVA tenderness, left CVA tenderness, guarding or rebound.     Hernia: No hernia is present.  Musculoskeletal:        General: No swelling, tenderness or deformity. Normal range of motion.     Cervical back: Normal range of motion and neck supple. No rigidity or tenderness.     Right lower leg: No edema.     Left lower leg: No edema.  Lymphadenopathy:     Cervical: No cervical adenopathy.  Skin:    General: Skin is warm and dry.     Coloration: Skin is not jaundiced or  pale.     Findings: No bruising, erythema, lesion or rash.  Neurological:     General: No focal deficit present.     Mental Status: She is alert and oriented to person, place, and time. Mental status is at baseline.     Cranial Nerves: No cranial nerve deficit.     Sensory: No sensory deficit.     Motor: No weakness.     Coordination: Coordination normal.     Gait: Gait normal.     Deep Tendon Reflexes: Reflexes normal.  Psychiatric:        Mood and Affect: Mood normal.        Behavior: Behavior normal.        Thought Content: Thought content normal.        Judgment: Judgment normal.      No results found.  Assessment/plan: Erika Weaver is a 47 y.o. female present for CPE and Chronic Conditions/illness Management Essential hypertension/Hyperlipidemia, mixed/ Morbid obesity (HCC)/Body mass index is 36.16 kg/m. Stable Continue amlodipine 5 mg daily Low-sodium diet.   CBC, CMP, TSH and lipids collected today   Leukemoid reaction Patient with chronic elevation in WBCs for the past 2 years.  Follows with Dr. Myna Hidalgo hematology.  No identifiable causes. CBC with differential collected today   Benign paroxysmal positional vertigo, unspecified laterality/PPPD/anxiety Stable Patient has been evaluated by to local neurologist and [facility] neurology.  Continue zoloft to 50 mg  Vitamin D deficiency -Vitamin D levels collected today Continue supplement  Encounter for general adult medical examination with abnormal findings Patient was encouraged to exercise greater than 150 minutes a week. Patient was encouraged to choose a diet filled with fresh fruits and vegetables, and lean meats. AVS provided to patient today for education/recommendation on gender specific health and safety maintenance. Colonoscopy: MGM colon cancer in her 74s. Pt elected cologuard screening> 03/2021 normal, next 2025 Mammogram: completed:06/13/2023,[facility]> ordered for next year. Cervical cancer screening: last  pap: 09/21/2018 with gyn. Immunizations: tdap administered, Influenza administered today (encouraged yearly) Infectious disease screening: HIV completed, Hep C completed today DEXA:routine screen  Return in about 24 weeks (around 01/01/2024) for Routine chronic condition follow-up.  Orders Placed This Encounter  Procedures   MM 3D SCREENING MAMMOGRAM BILATERAL BREAST   Flu vaccine trivalent PF, 6mos and older(Flulaval,Afluria,Fluarix,Fluzone)   Tdap vaccine greater than or equal to 7yo IM   Vitamin D (25 hydroxy)   Comp Met (CMET)   TSH   Lipid panel   Hemoglobin A1c   CBC w/Diff   Meds ordered this encounter  Medications   amLODipine (NORVASC) 5 MG tablet    Sig: TAKE 1 TABLET BY MOUTH EVERY DAY    Dispense:  90 tablet    Refill:  1   sertraline (ZOLOFT) 50 MG tablet  Sig: Take 1 tablet (50 mg total) by mouth daily.    Dispense:  90 tablet    Refill:  1   Referral Orders  No referral(s) requested today     Electronically signed by: Felix Pacini, DO [facility]

## 2023-07-17 NOTE — Patient Instructions (Signed)

## 2023-07-18 LAB — LIPID PANEL
Cholesterol: 169 mg/dL (ref 0–200)
HDL: 38.9 mg/dL — ABNORMAL LOW (ref >39.00)
LDL Cholesterol: 75 mg/dL (ref 0–99)
NonHDL: 129.67
Total CHOL/HDL Ratio: 4
Triglycerides: 275 mg/dL — ABNORMAL HIGH (ref 0.0–149.0)
VLDL: 55 mg/dL — ABNORMAL HIGH (ref 0.0–40.0)

## 2023-07-18 LAB — CBC WITH DIFFERENTIAL/PLATELET
Basophils Absolute: 0.1 10*3/uL (ref 0.0–0.1)
Basophils Relative: 0.5 % (ref 0.0–3.0)
Eosinophils Absolute: 0.3 10*3/uL (ref 0.0–0.7)
Eosinophils Relative: 2.6 % (ref 0.0–5.0)
HCT: 40.7 % (ref 36.0–46.0)
Hemoglobin: 13.1 g/dL (ref 12.0–15.0)
Lymphocytes Relative: 23.3 % (ref 12.0–46.0)
Lymphs Abs: 2.7 10*3/uL (ref 0.7–4.0)
MCHC: 32.3 g/dL (ref 30.0–36.0)
MCV: 94.5 fl (ref 78.0–100.0)
Monocytes Absolute: 0.9 10*3/uL (ref 0.1–1.0)
Monocytes Relative: 7.6 % (ref 3.0–12.0)
Neutro Abs: 7.5 10*3/uL (ref 1.4–7.7)
Neutrophils Relative %: 66 % (ref 43.0–77.0)
Platelets: 266 10*3/uL (ref 150.0–400.0)
RBC: 4.31 Mil/uL (ref 3.87–5.11)
RDW: 12.8 % (ref 11.5–15.5)
WBC: 11.4 10*3/uL — ABNORMAL HIGH (ref 4.0–10.5)

## 2023-07-18 LAB — HEMOGLOBIN A1C: Hgb A1c MFr Bld: 5.6 % (ref 4.6–6.5)

## 2023-07-18 LAB — COMPREHENSIVE METABOLIC PANEL
ALT: 16 U/L (ref 0–35)
AST: 12 U/L (ref 0–37)
Albumin: 3.6 g/dL (ref 3.5–5.2)
Alkaline Phosphatase: 70 U/L (ref 39–117)
BUN: 12 mg/dL (ref 6–23)
CO2: 27 mEq/L (ref 19–32)
Calcium: 9.4 mg/dL (ref 8.4–10.5)
Chloride: 103 mEq/L (ref 96–112)
Creatinine, Ser: 0.63 mg/dL (ref 0.40–1.20)
GFR: 104.55 mL/min (ref >60.00)
Glucose, Bld: 96 mg/dL (ref 70–99)
Potassium: 3.9 mEq/L (ref 3.5–5.1)
Sodium: 136 mEq/L (ref 135–145)
Total Bilirubin: 0.3 mg/dL (ref 0.2–1.2)
Total Protein: 6.6 g/dL (ref 6.0–8.3)

## 2023-07-18 LAB — TSH: TSH: 1.3 u[IU]/mL (ref 0.35–5.50)

## 2023-07-18 LAB — VITAMIN D 25 HYDROXY (VIT D DEFICIENCY, FRACTURES): VITD: 10.11 ng/mL — ABNORMAL LOW (ref 30.00–100.00)

## 2023-07-19 ENCOUNTER — Telehealth: Payer: Self-pay | Admitting: Family Medicine

## 2023-07-19 MED ORDER — VITAMIN D (ERGOCALCIFEROL) 1.25 MG (50000 UNIT) PO CAPS: 50000.0000 [IU] | ORAL_CAPSULE | ORAL | 1 refills | Status: DC

## 2023-07-19 NOTE — Telephone Encounter (Signed)
Please call patient Erika Weaver, kidney and thyroid function are normal Blood cell counts and electrolytes are normal Diabetes screening/A1c is normal  Cholesterol panel looks good and is at goal with exception of triglycerides being elevated.  If she was not fasting for 9 hours that could be the cause of her elevated triglycerides.  If she was fasting I would encourage her to increase the fiber content in her diet.   Her vitamin D is severely low again at 10.  I have called in the high-dose once weekly vitamin D for her to start again.  I also recommend after completing his high-dose vitamin D she start 3000 units of vitamin D daily. We need to get her vitamin D up into normal range so that she does not develop low bone density as she ages.

## 2023-07-19 NOTE — Telephone Encounter (Signed)
Spoke with patient regarding results/recommendations.  

## 2023-08-19 ENCOUNTER — Telehealth: Payer: Self-pay | Admitting: Family Medicine

## 2023-08-19 NOTE — Telephone Encounter (Signed)
Patient reports she has not had meclizine (ANTIVERT) 25 MG tablet  in a while, but is needing it and would like to know if it can be called into [facility] in [place]. Please let the patient know if this request can be honored.

## 2023-08-21 NOTE — Telephone Encounter (Signed)
Pt was on medication in 2017 and it was prescribed by someone else. Pt would need to schedule an appt

## 2023-08-21 NOTE — Telephone Encounter (Signed)
She is going to call back to schedule

## 2023-08-21 NOTE — Telephone Encounter (Signed)
noted 

## 2023-08-27 ENCOUNTER — Telehealth: Payer: Self-pay | Admitting: Family Medicine

## 2023-08-27 NOTE — Telephone Encounter (Signed)
Prescription Request  08/27/2023  LOV: 07/17/2023  What is the name of the medication or equipment?   amLODipine (NORVASC) 5 MG tablet    Have you contacted your pharmacy to request a refill? Yes   Which pharmacy would you like this sent to?  [id number] - [place], [place] - [address] [place] [address] Phone: [phone] Fax: [phone]    Patient notified that their request is being sent to the clinical staff for review and that they should receive a response within 2 business days.   Please advise at Mobile [phone] (mobile)

## 2023-08-28 ENCOUNTER — Other Ambulatory Visit: Payer: Self-pay

## 2023-08-28 MED ORDER — AMLODIPINE BESYLATE 5 MG PO TABS: ORAL_TABLET | ORAL | 1 refills | Status: DC

## 2023-08-28 NOTE — Telephone Encounter (Signed)
Confirmed that pharmacy did not get last Rx. Rx resent (90,1)

## 2023-10-14 ENCOUNTER — Other Ambulatory Visit: Payer: Self-pay | Admitting: Family Medicine

## 2023-10-14 MED ORDER — MECLIZINE HCL 25 MG PO TABS: 25.0000 mg | ORAL_TABLET | Freq: Three times a day (TID) | ORAL | 5 refills | Status: DC | PRN

## 2023-10-14 NOTE — Progress Notes (Signed)
Refilled meclizine for patient's vertigo per patient request

## 2023-12-18 IMAGING — DX DG CHEST 2V
2 series · 2 of 2 positions shown · non-contrast
Comparison: Chest radiograph 03/29/2017

CLINICAL DATA: Shortness of breath with exertion.

EXAM:
CHEST - 2 VIEW

[chest pa]
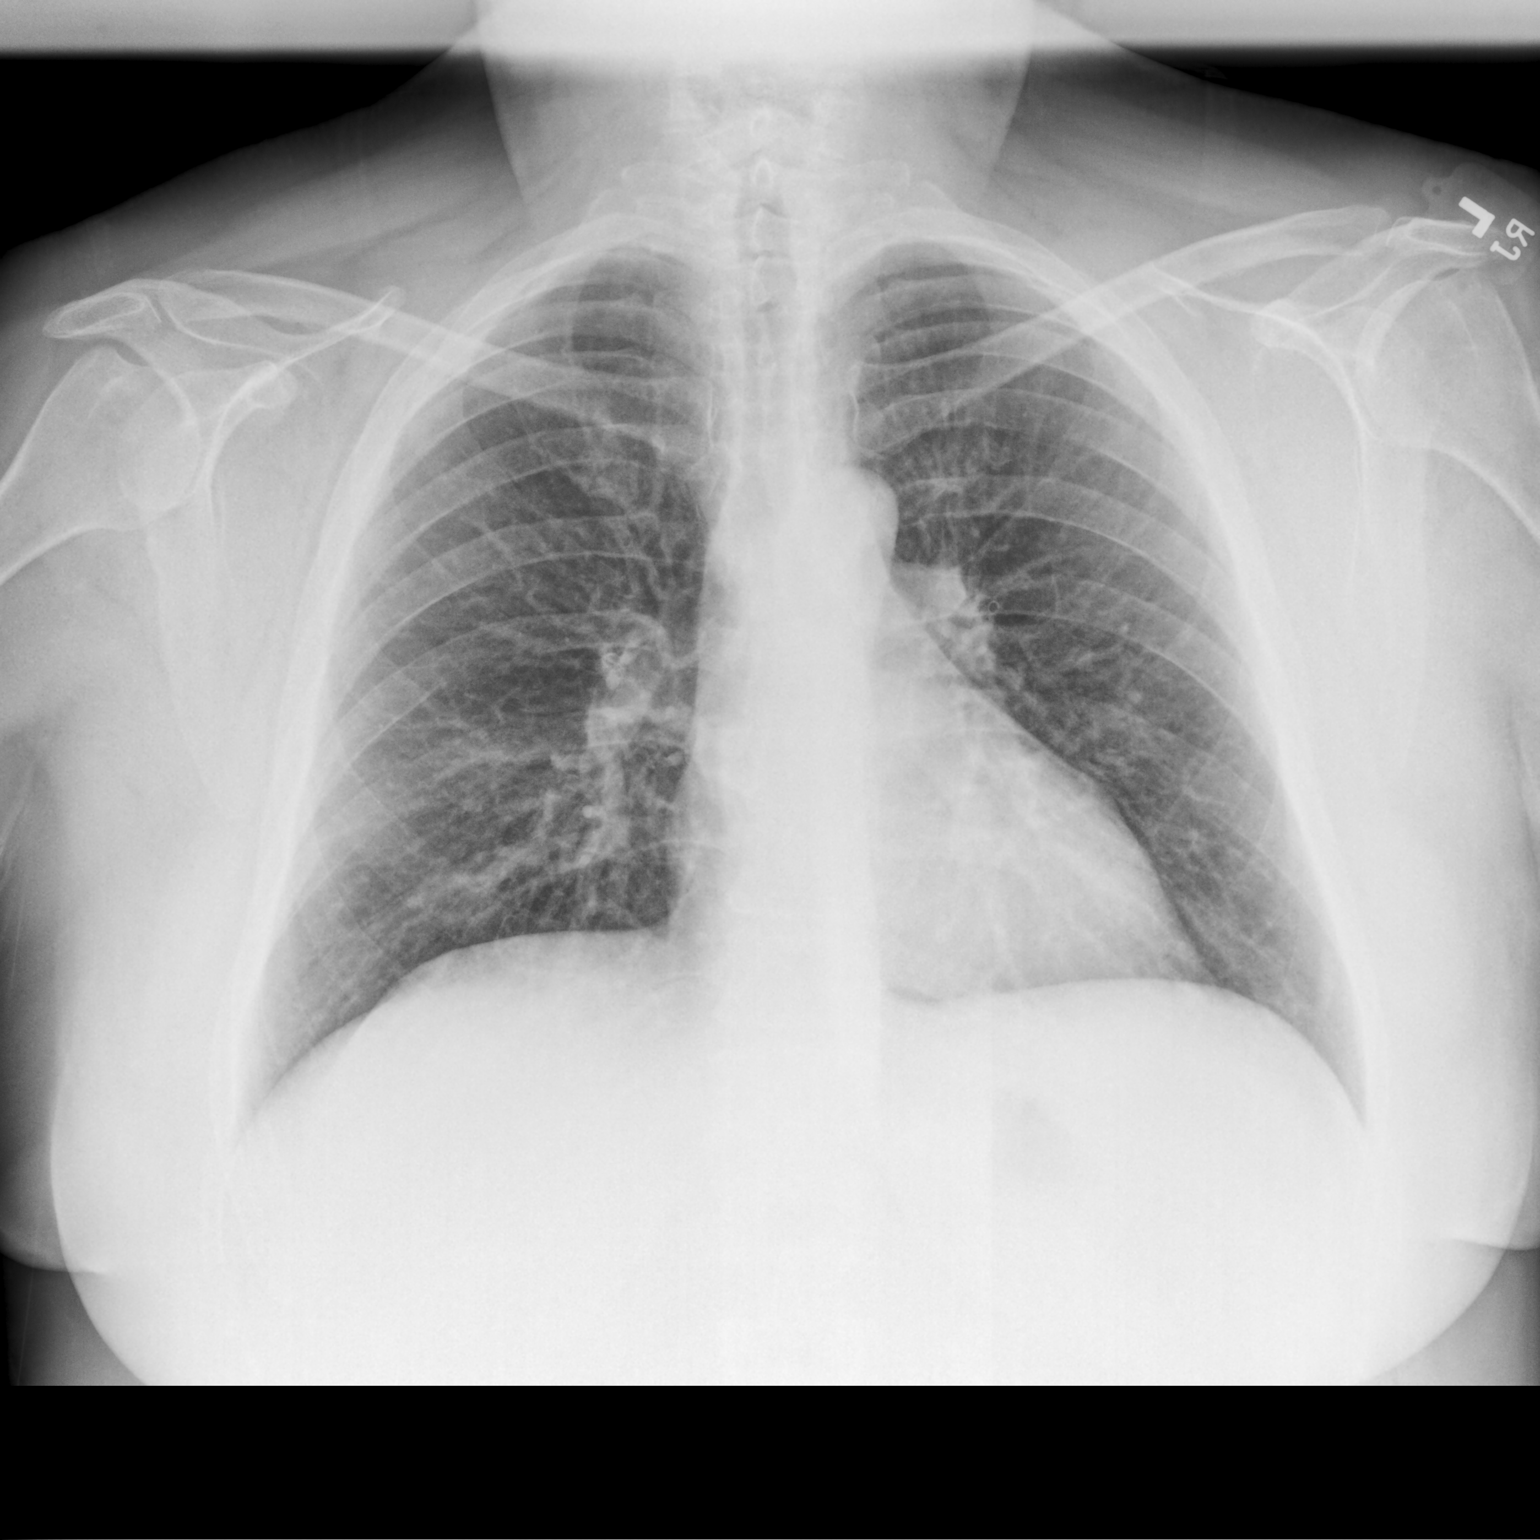

[chest lat]
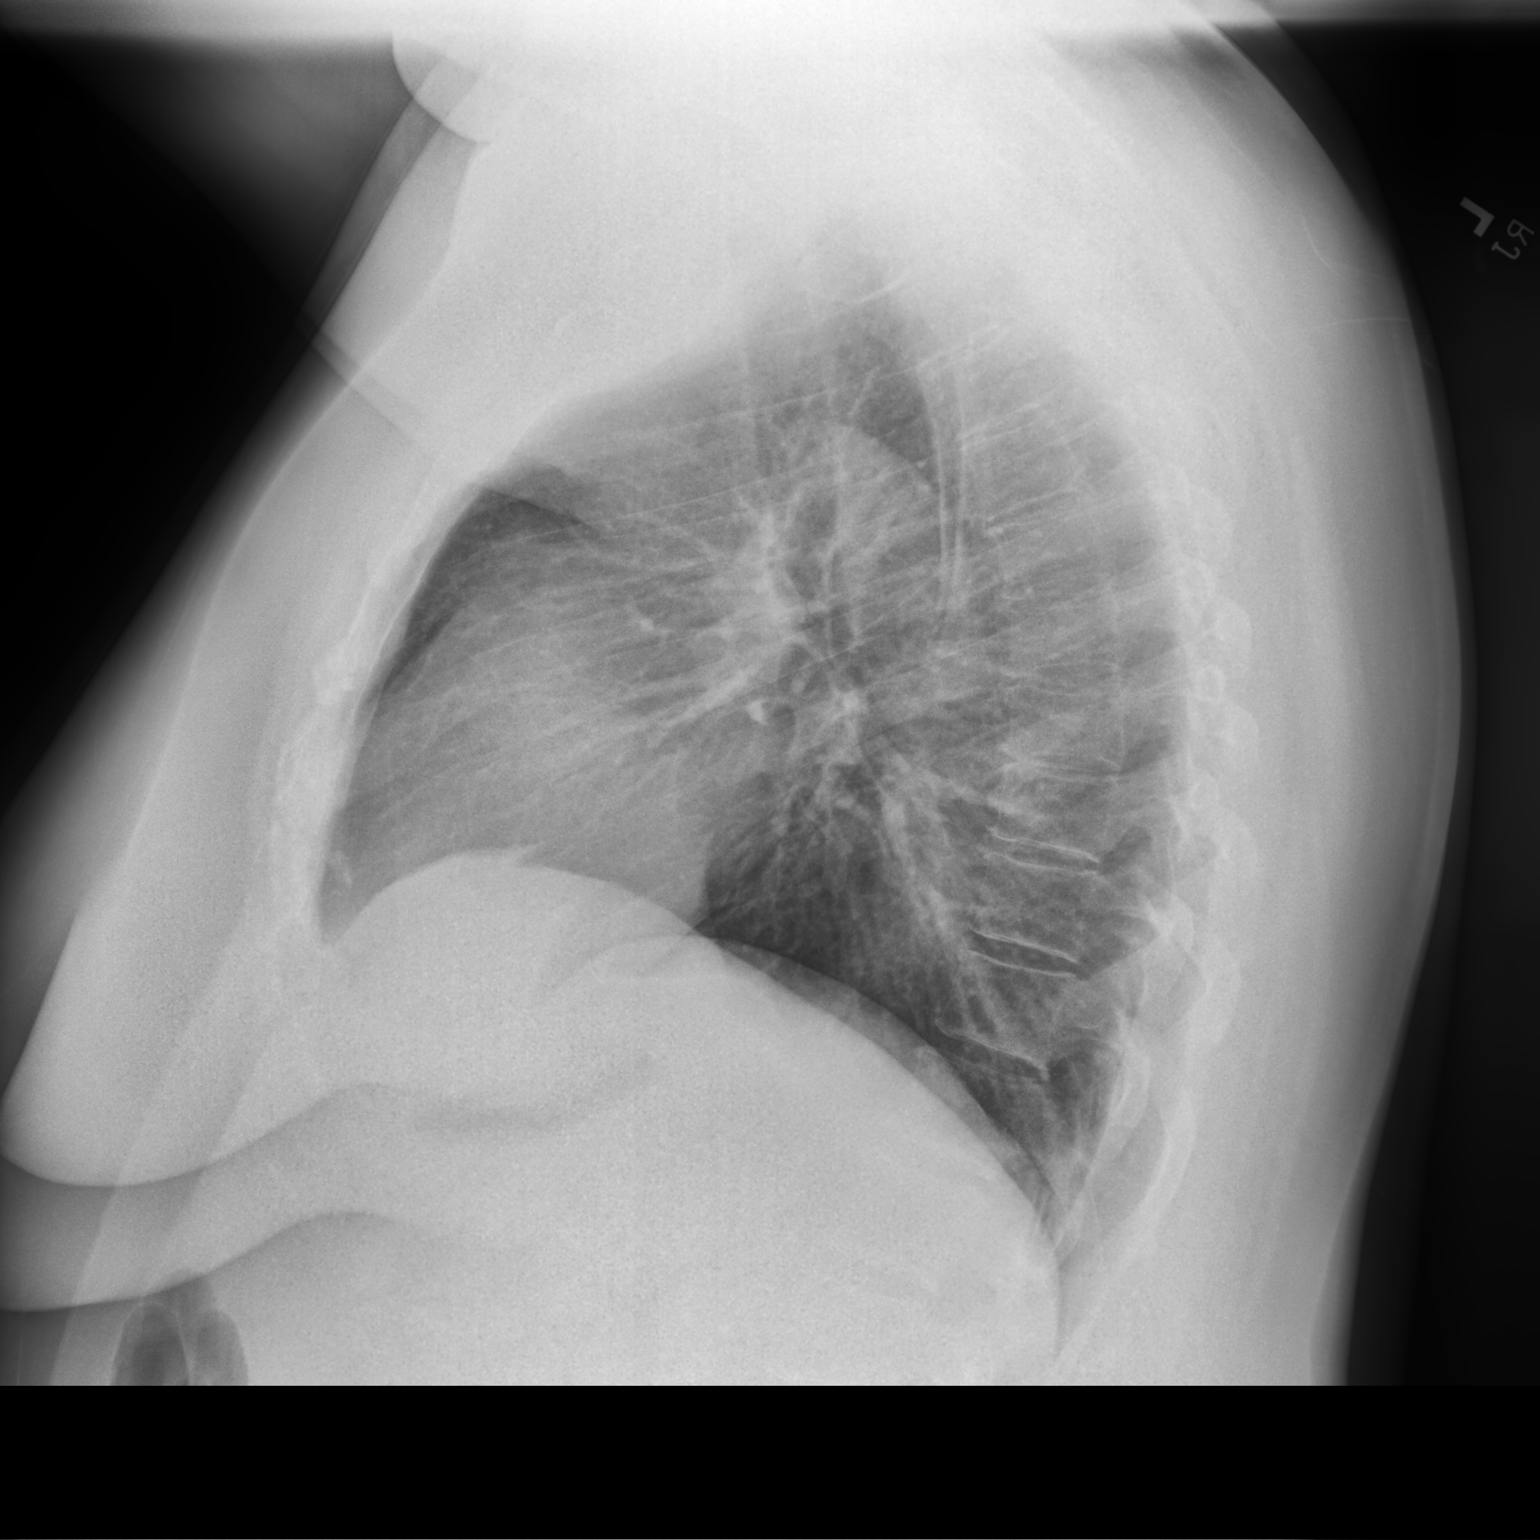

[2 of 2 positions shown; findings below may reference images not displayed]

FINDINGS: The heart size and mediastinal contours are within normal limits.
Both lungs are clear. The visualized skeletal structures are
unremarkable.
IMPRESSION: No active cardiopulmonary disease.

## 2024-01-01 ENCOUNTER — Ambulatory Visit: Payer: 59 | Admitting: Family Medicine

## 2024-01-01 ENCOUNTER — Encounter: Payer: Self-pay | Admitting: Family Medicine

## 2024-01-01 VITALS — BP 132/82 | HR 87 | Temp 98.2°F | Wt 228.2 lb

## 2024-01-01 DIAGNOSIS — F419 Anxiety disorder, unspecified: Secondary | ICD-10-CM | POA: Diagnosis not present

## 2024-01-01 DIAGNOSIS — H8110 Benign paroxysmal vertigo, unspecified ear: Secondary | ICD-10-CM | POA: Diagnosis not present

## 2024-01-01 DIAGNOSIS — E782 Mixed hyperlipidemia: Secondary | ICD-10-CM

## 2024-01-01 DIAGNOSIS — I10 Essential (primary) hypertension: Secondary | ICD-10-CM

## 2024-01-01 DIAGNOSIS — E559 Vitamin D deficiency, unspecified: Secondary | ICD-10-CM

## 2024-01-01 MED ORDER — AMLODIPINE BESYLATE 5 MG PO TABS: ORAL_TABLET | ORAL | 1 refills | Status: DC

## 2024-01-01 MED ORDER — VITAMIN D (ERGOCALCIFEROL) 1.25 MG (50000 UNIT) PO CAPS: 50000.0000 [IU] | ORAL_CAPSULE | ORAL | 3 refills | Status: DC

## 2024-01-01 MED ORDER — SERTRALINE HCL 50 MG PO TABS: 50.0000 mg | ORAL_TABLET | Freq: Every day | ORAL | 1 refills | Status: DC

## 2024-01-01 NOTE — Progress Notes (Signed)
 Patient ID: Erika Weaver, female  DOB: [birth year], 48 y.o.   MRN: [id number] Patient Care Team    Relationship Specialty Notifications Start End  Natalia Leatherwood, DO PCP - General Family Medicine  07/15/19   Richarda Overlie, MD Consulting Physician Obstetrics and Gynecology  08/30/16   Josph Macho, MD Consulting Physician Oncology  08/25/19   Octavia Bruckner, MD Referring Physician Internal Medicine  08/25/19   Runell Gess, MD Consulting Physician Cardiology  08/25/19   Meryl Dare, MD (Inactive) Consulting Physician Gastroenterology  08/25/19     Chief Complaint  Patient presents with   Hypertension    Subjective:  Erika Weaver is a 48 y.o.  Female  present for  Chronic Conditions/illness Management All past medical history, surgical history, allergies, family history, immunizations, medications and social history were updated in the electronic medical record today. All recent labs, ED visits and hospitalizations within the last year were reviewed.   Essential hypertension/Hyperlipidemia, mixed/Morbid obesity (HCC) Pt reports compliance with amlodipine 5 mg daily.  Patient denies chest pain, shortness of breath, dizziness or lower extremity edema.  Pt does not daily baby ASA. Pt is not prescribed statin.   Anxiety/persistent postural perceptional vertigo Patient reports she was having dizziness and was getting frustrated secondary to diagnosis unable to be identified.   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  She reports they diagnosed her w/ PPPV and started her on low-dose Zoloft.   Since that time she has had a great response to the medication with only occasional recurrence.  She is compliant with zoloft 50 mg qd     01/01/2024    1:50 PM 07/17/2023    1:56 PM 11/05/2022    2:29 PM 02/12/2022    2:03 PM 11/15/2021    3:31 PM  Depression screen PHQ 2/9  Decreased Interest 0 0 0 0 0  Down,  Depressed, Hopeless 0 0 0 0 0  PHQ - 2 Score 0 0 0 0 0  Altered sleeping 0 0 0 0 0  Tired, decreased energy 0 0 0 0 0  Change in appetite 0 0 0 0 0  Feeling bad or failure about yourself  0 0 0 0 0  Trouble concentrating 0 0 0 0 0  Moving slowly or fidgety/restless 0 0 0 0 0  Suicidal thoughts 0 0 0 0 0  PHQ-9 Score 0 0 0 0 0  Difficult doing work/chores Not difficult at all Not difficult at all         01/01/2024    1:50 PM 07/17/2023    1:57 PM 11/05/2022    2:30 PM 02/12/2022    2:06 PM  GAD 7 : Generalized Anxiety Score  Nervous, Anxious, on Edge 0 0 0 0  Control/stop worrying 0 0 0 0  Worry too much - different things 0 0 0 0  Trouble relaxing 0 0 0 0  Restless 0 0 0 0  Easily annoyed or irritable 0 0 0 0  Afraid - awful might happen 0 0  0  Total GAD 7 Score 0 0  0  Anxiety Difficulty Not difficult at all        Immunization History  Administered Date(s) Administered   Influenza Split 07/22/2009   Influenza Whole 07/23/2011   Influenza, Seasonal, Injecte, Preservative Fre 07/17/2023   Influenza,inj,Quad PF,6+ Mos 08/18/2013, 12/09/2014, 09/22/2015, 08/30/2016, 09/05/2017, 07/31/2018, 08/25/2019, 09/08/2020, 11/15/2021,  10/10/2022   Influenza,inj,quad, With Preservative 08/22/2017   Influenza-Unspecified 07/27/2011   PFIZER(Purple Top)SARS-COV-2 Vaccination 07/01/2020, 07/22/2020   Tdap 12/16/2009, 08/18/2013, 07/17/2023     Past Medical History:  Diagnosis Date   Allergic state 11/22/2011   Anxiety 11/22/2011   Back pain 01/31/2013   Bartholin cyst 03/01/2013   BPV (benign positional vertigo) 08/23/2013   Chicken pox as a child   Headache 11/22/2011   Overview:  Headache  ICD-10 cut over     Herpes zoster 12/18/2010   Hypertension    Kidney stone 12/21/2012   Other and unspecified hyperlipidemia 03/07/2014   Plantar fasciitis 01/11/2012   Rectal vaginal fistula 01/31/2013   Retrosternal chest pain 11/08/2015   Sinusitis 11/22/2011   Sleep apnea  04/03/2012   setting 2   Visual changes 11/22/2011   Allergies  Allergen Reactions   Topamax [Topiramate]     dizziness   Zonegran [Zonisamide]     dizziness   Past Surgical History:  Procedure Laterality Date   GUM SURGERY     PERINEAL BODY REPAIR     4 th degree tear with fistula between vagina and retum requiring 3 surgeries for correction   rectal fistula repair     x 2   WISDOM TOOTH EXTRACTION     Family History  Problem Relation Age of Onset   Diabetes Mother        type 2   Hypertension Mother    Hyperlipidemia Mother    Hypertension Father    Hyperlipidemia Father    Aneurysm Father        abdominal aorta   Cancer Maternal Grandmother 53       colon, ovarian   Other Maternal Grandfather 41       brain hemorrhage   Stroke Maternal Grandfather    Aneurysm Maternal Grandfather    Heart attack Paternal Grandmother    Heart disease Paternal Grandmother        MI   Alzheimer's disease Paternal Grandfather    Dementia Paternal Grandfather    Heart attack Paternal Aunt    Heart disease Paternal Aunt         AAA rupture   Other Paternal Uncle        cerebreal brain hemorrhage   Stroke Paternal Uncle    Aneurysm Paternal Uncle    Asthma Daughter    Other Daughter        peanut allergy   Heart disease Maternal Aunt        s/p cabg. brain aneurysm s/p coiling   Aneurysm Maternal Aunt        coil behind eye   Social History   Social History Narrative   Marital status/children/pets: 1 child.  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   Safety:      -smoke alarm in the home:Yes     - wears seatbelt: Yes     - Feels safe in their relationships: Yes    Allergies as of 01/01/2024       Reactions   Topamax [topiramate]    dizziness   Zonegran [zonisamide]    dizziness        Medication List        Accurate as of January 01, 2024  2:14 PM. If you have any questions, ask your nurse or doctor.          STOP  taking these medications    albuterol 108 (90 Base) MCG/ACT inhaler Commonly  known as: VENTOLIN HFA Stopped by: Felix Pacini   budesonide-formoterol 160-4.5 MCG/ACT inhaler Commonly known as: Symbicort Stopped by: Felix Pacini       TAKE these medications    amLODipine 5 MG tablet Commonly known as: NORVASC TAKE 1 TABLET BY MOUTH EVERY DAY   meclizine 25 MG tablet Commonly known as: ANTIVERT Take 1 tablet (25 mg total) by mouth 3 (three) times daily as needed.   Multi-Vitamins Tabs Take by mouth.   sertraline 50 MG tablet Commonly known as: ZOLOFT Take 1 tablet (50 mg total) by mouth daily.   Vitamin D (Ergocalciferol) 1.25 MG (50000 UNIT) Caps capsule Commonly known as: DRISDOL Take 1 capsule (50,000 Units total) by mouth every 7 (seven) days.        All past medical history, surgical history, allergies, family history, immunizations andmedications were updated in the EMR today and reviewed under the history and medication portions of their EMR.      ROS 14 pt review of systems performed and negative (unless mentioned in an HPI)  Objective: BP 132/82   Pulse 87   Temp 98.2 F (36.8 C)   Wt 228 lb 3.2 oz (103.5 kg)   SpO2 97%   BMI 37.97 kg/m  Physical Exam Vitals and nursing note reviewed.  Constitutional:      General: She is not in acute distress.    Appearance: Normal appearance. She is not ill-appearing, toxic-appearing or diaphoretic.  HENT:     Head: Normocephalic and atraumatic.  Eyes:     General: No scleral icterus.       Right eye: No discharge.        Left eye: No discharge.     Extraocular Movements: Extraocular movements intact.     Conjunctiva/sclera: Conjunctivae normal.     Pupils: Pupils are equal, round, and reactive to light.  Cardiovascular:     Rate and Rhythm: Normal rate and regular rhythm.  Pulmonary:     Effort: Pulmonary effort is normal. No respiratory distress.     Breath sounds: Normal breath sounds. No wheezing,  rhonchi or rales.  Musculoskeletal:     Right lower leg: No edema.     Left lower leg: No edema.  Skin:    General: Skin is warm.     Findings: No rash.  Neurological:     Mental Status: She is alert and oriented to person, place, and time. Mental status is at baseline.     Motor: No weakness.     Gait: Gait normal.  Psychiatric:        Mood and Affect: Mood normal.        Behavior: Behavior normal.        Thought Content: Thought content normal.        Judgment: Judgment normal.      No results found.  Assessment/plan: Erika Weaver is a 48 y.o. female present for Chronic Conditions/illness Management Essential hypertension/Hyperlipidemia, mixed/ Morbid obesity (HCC)/Body mass index is 36.16 kg/m. Stable Continue amlodipine 5 mg daily Low-sodium diet.   CBC, CMP, TSH and lipids due next visit   Leukemoid reaction Patient with chronic elevation in WBCs for the past 2 years.  Follows with Dr. Myna Hidalgo hematology.  No identifiable causes.    Benign paroxysmal positional vertigo, unspecified laterality/PPPD/anxiety stable Patient has been evaluated by to local neurologist and [facility].  Continue  zoloft to 50 mg  Vitamin D deficiency -Vitamin D levels collected today Continue supplement   Return in  about 7 months (around 07/17/2024) for cpe (20 min), Routine chronic condition follow-up.  Orders Placed This Encounter  Procedures   Vitamin D (25 hydroxy)   Meds ordered this encounter  Medications   amLODipine (NORVASC) 5 MG tablet    Sig: TAKE 1 TABLET BY MOUTH EVERY DAY    Dispense:  90 tablet    Refill:  1   sertraline (ZOLOFT) 50 MG tablet    Sig: Take 1 tablet (50 mg total) by mouth daily.    Dispense:  90 tablet    Refill:  1   Vitamin D, Ergocalciferol, (DRISDOL) 1.25 MG (50000 UNIT) CAPS capsule    Sig: Take 1 capsule (50,000 Units total) by mouth every 7 (seven) days.    Dispense:  12 capsule    Refill:  3   Referral Orders  No referral(s)  requested today     Electronically signed by: Felix Pacini, DO [facility] [place]

## 2024-01-01 NOTE — Patient Instructions (Addendum)
 Return in about 7 months (around 07/17/2024) for cpe (20 min), Routine chronic condition follow-up.        Great to see you today.  I have refilled the medication(s) we provide.   If labs were collected or images ordered, we will inform you of  results once we have received them and reviewed. We will contact you either by echart message, or telephone call.  Please give ample time to the testing facility, and our office to run,  receive and review results. Please do not call inquiring of results, even if you can see them in your chart. We will contact you as soon as we are able. If it has been over 1 week since the test was completed, and you have not yet heard from [place], then please call us.    - echart message- for normal results that have been seen by the patient already.   - telephone call: abnormal results or if patient has not viewed results in their echart.  If a referral to a specialist was entered for you, please call us in 2 weeks if you have not heard from the specialist office to schedule.

## 2024-01-02 LAB — VITAMIN D 25 HYDROXY (VIT D DEFICIENCY, FRACTURES): VITD: 20.02 ng/mL — ABNORMAL LOW (ref 30.00–100.00)

## 2024-01-06 ENCOUNTER — Telehealth: Payer: Self-pay

## 2024-01-06 NOTE — Telephone Encounter (Signed)
 Reason for CRM: Returning phone call from [facility] - relayed message, patient understood and had no additional questions.      Noted.

## 2024-06-10 ENCOUNTER — Encounter: Payer: Self-pay | Admitting: Family Medicine

## 2024-06-10 ENCOUNTER — Ambulatory Visit: Admitting: Family Medicine

## 2024-06-10 VITALS — BP 126/82 | HR 74 | Temp 97.9°F | Wt 222.6 lb

## 2024-06-10 DIAGNOSIS — R109 Unspecified abdominal pain: Secondary | ICD-10-CM

## 2024-06-10 DIAGNOSIS — M7989 Other specified soft tissue disorders: Secondary | ICD-10-CM

## 2024-06-10 NOTE — Patient Instructions (Addendum)
         Great to see you today.  I have refilled the medication(s) we provide.   If labs were collected or images ordered, we will inform you of  results once we have received them and reviewed. We will contact you either by echart message, or telephone call.  Please give ample time to the testing facility, and our office to run,  receive and review results. Please do not call inquiring of results, even if you can see them in your chart. We will contact you as soon as we are able. If it has been over 1 week since the test was completed, and you have not yet heard from us , then please call us .    - echart message- for normal results that have been seen by the patient already.   - telephone call: abnormal results or if patient has not viewed results in their echart.  If a referral to a specialist was entered for you, please call us  in 2 weeks if you have not heard from the specialist office to schedule.

## 2024-06-10 NOTE — Progress Notes (Signed)
 Erika Weaver , 11/08/1975, 48 y.o., female MRN: [id number] Patient Care Team    Relationship Specialty Notifications Start End  Catherine Charlies LABOR, DO PCP - General Family Medicine  07/15/19   Johnnye Ade, MD Consulting Physician Obstetrics and Gynecology  08/30/16   Timmy Maude SAUNDERS, MD Consulting Physician Oncology  08/25/19   Girard Sharper, MD Referring Physician Internal Medicine  08/25/19   Court Dorn PARAS, MD Consulting Physician Cardiology  08/25/19   Aneita Gwendlyn DASEN, MD (Inactive) Consulting Physician Gastroenterology  08/25/19     Chief Complaint  Patient presents with   Abdominal Pain    1 week; RLQ; burning sensation. Pt feels a knot in the area.      Subjective: Erika Weaver is a 48 y.o. Pt presents for an OV with complaints of RLQ abd pain  of 1 week duration.  Associated symptoms include burning sensation at location and feels a knot at the location.  She reports she continues to feel a burning sensation in this location intermittently throughout the day.  She states burning can occur no matter what she is doing including sleeping.  It has awaken her from sleep.  No fevers or chills.  No urinary discomfort.  Bowel movements normal, last bowel movement yesterday.  Pt has tried nothing to ease their symptoms.      01/01/2024    1:50 PM 07/17/2023    1:56 PM 11/05/2022    2:29 PM 02/12/2022    2:03 PM 11/15/2021    3:31 PM  Depression screen PHQ 2/9  Decreased Interest 0 0 0 0 0  Down, Depressed, Hopeless 0 0 0 0 0  PHQ - 2 Score 0 0 0 0 0  Altered sleeping 0 0 0 0 0  Tired, decreased energy 0 0 0 0 0  Change in appetite 0 0 0 0 0  Feeling bad or failure about yourself  0 0 0 0 0  Trouble concentrating 0 0 0 0 0  Moving slowly or fidgety/restless 0 0 0 0 0  Suicidal thoughts 0 0 0 0 0  PHQ-9 Score 0 0 0 0 0  Difficult doing work/chores Not difficult at all Not difficult at all       Allergies  Allergen Reactions   Topamax  [Topiramate ]      dizziness   Zonegran  [Zonisamide ]     dizziness   Social History   Social History Narrative   Marital status/children/pets: 1 child.  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   Safety:      -smoke alarm in the home:Yes     - wears seatbelt: Yes     - Feels safe in their relationships: Yes   Past Medical History:  Diagnosis Date   Allergic state 11/22/2011   Anxiety 11/22/2011   Back pain 01/31/2013   Bartholin cyst 03/01/2013   BPV (benign positional vertigo) 08/23/2013   Chicken pox as a child   Headache 11/22/2011   Overview:  Headache  ICD-10 cut over     Herpes zoster 12/18/2010   Hypertension    Kidney stone 12/21/2012   Other and unspecified hyperlipidemia 03/07/2014   Plantar fasciitis 01/11/2012   Rectal vaginal fistula 01/31/2013   Retrosternal chest pain 11/08/2015   Sinusitis 11/22/2011   Sleep apnea 04/03/2012   setting 2   Visual changes 11/22/2011   Past Surgical History:  Procedure Laterality Date   GUM SURGERY  PERINEAL BODY REPAIR     4 th degree tear with fistula between vagina and retum requiring 3 surgeries for correction   rectal fistula repair     x 2   WISDOM TOOTH EXTRACTION     Family History  Problem Relation Age of Onset   Diabetes Mother        type 2   Hypertension Mother    Hyperlipidemia Mother    Hypertension Father    Hyperlipidemia Father    Aneurysm Father        abdominal aorta   Cancer Maternal Grandmother 55       colon, ovarian   Other Maternal Grandfather 41       brain hemorrhage   Stroke Maternal Grandfather    Aneurysm Maternal Grandfather    Heart attack Paternal Grandmother    Heart disease Paternal Grandmother        MI   Alzheimer's disease Paternal Grandfather    Dementia Paternal Grandfather    Heart attack Paternal Aunt    Heart disease Paternal Aunt         AAA rupture   Other Paternal Uncle        cerebreal brain hemorrhage   Stroke Paternal Uncle     Aneurysm Paternal Uncle    Asthma Daughter    Other Daughter        peanut allergy   Heart disease Maternal Aunt        s/p cabg. brain aneurysm s/p coiling   Aneurysm Maternal Aunt        coil behind eye   Allergies as of 06/10/2024       Reactions   Topamax  [topiramate ]    dizziness   Zonegran  [zonisamide ]    dizziness        Medication List        Accurate as of June 10, 2024  3:15 PM. If you have any questions, ask your nurse or doctor.          amLODipine  5 MG tablet Commonly known as: NORVASC  TAKE 1 TABLET BY MOUTH EVERY DAY   meclizine  25 MG tablet Commonly known as: ANTIVERT  Take 1 tablet (25 mg total) by mouth 3 (three) times daily as needed.   Multi-Vitamins Tabs Take by mouth.   sertraline  50 MG tablet Commonly known as: ZOLOFT  Take 1 tablet (50 mg total) by mouth daily.   Vitamin D  (Ergocalciferol ) 1.25 MG (50000 UNIT) Caps capsule Commonly known as: DRISDOL  Take 1 capsule (50,000 Units total) by mouth every 7 (seven) days.        All past medical history, surgical history, allergies, family history, immunizations andmedications were updated in the EMR today and reviewed under the history and medication portions of their EMR.     ROS Negative, with the exception of above mentioned in HPI   Objective:  BP 126/82   Pulse 74   Temp 97.9 F (36.6 C)   Wt 222 lb 9.6 oz (101 kg)   SpO2 98%   BMI 37.04 kg/m  Body mass index is 37.04 kg/m.  Physical Exam Vitals and nursing note reviewed.  Constitutional:      General: She is not in acute distress.    Appearance: Normal appearance. She is normal weight. She is not ill-appearing or toxic-appearing.  HENT:     Head: Normocephalic and atraumatic.  Eyes:     General: No scleral icterus.       Right eye: No discharge.  Left eye: No discharge.     Extraocular Movements: Extraocular movements intact.     Conjunctiva/sclera: Conjunctivae normal.     Pupils: Pupils are equal, round,  and reactive to light.  Abdominal:     General: Abdomen is protuberant. Bowel sounds are normal. There is no distension. There are no signs of injury.     Palpations: Abdomen is soft. There is mass. There is no shifting dullness or hepatomegaly.     Tenderness: There is no abdominal tenderness.      Comments: ~2-3 inch irreguar shaped mass palpated. Size of mass unchanged with valsalva. Mass is positioned above muscle layer. No ttp on exam today. No skin changes or bruising   Skin:    Findings: No rash.  Neurological:     Mental Status: She is alert and oriented to person, place, and time. Mental status is at baseline.     Motor: No weakness.     Coordination: Coordination normal.     Gait: Gait normal.  Psychiatric:        Mood and Affect: Mood normal.        Behavior: Behavior normal.        Thought Content: Thought content normal.        Judgment: Judgment normal.      No results found. No results found. No results found for this or any previous visit (from the past 24 hours).  Assessment/Plan: Erika Weaver is a 48 y.o. female present for OV for  Soft tissue mass (Primary)/abdominal pain We elected to perform a soft tissue ultrasound of the abdomen.  Mass is above the muscle layer and in the soft tissue.  We discussed possible lipoma, neurofibroma or injury.  She may have injured the area without knowing. NSAIDs as needed for comfort. - US  Abdomen Limited; Future   Reviewed expectations re: course of current medical issues. Discussed self-management of symptoms. Outlined signs and symptoms indicating need for more acute intervention. Patient verbalized understanding and all questions were answered. Patient received an After-Visit Summary.    Orders Placed This Encounter  Procedures   US  Abdomen Limited   No orders of the defined types were placed in this encounter.  Referral Orders  No referral(s) requested today     Note is dictated utilizing voice  recognition software. Although note has been proof read prior to signing, occasional typographical errors still can be missed. If any questions arise, please do not hesitate to call for verification.   electronically signed by:  Charlies Bellini, DO  [facility] [place]

## 2024-06-12 ENCOUNTER — Ambulatory Visit
Admission: RE | Admit: 2024-06-12 | Discharge: 2024-06-12 | Disposition: A | Discharge status: Home / Self Care | Source: Ambulatory Visit | Attending: Family Medicine | Admitting: Family Medicine

## 2024-06-12 DIAGNOSIS — M7989 Other specified soft tissue disorders: Secondary | ICD-10-CM

## 2024-06-12 DIAGNOSIS — R109 Unspecified abdominal pain: Secondary | ICD-10-CM

## 2024-06-15 ENCOUNTER — Ambulatory Visit
Admission: RE | Admit: 2024-06-15 | Discharge: 2024-06-15 | Disposition: A | Discharge status: Home / Self Care | Source: Ambulatory Visit | Attending: Family Medicine | Admitting: Family Medicine

## 2024-06-15 DIAGNOSIS — Z1231 Encounter for screening mammogram for malignant neoplasm of breast: Secondary | ICD-10-CM

## 2024-06-17 ENCOUNTER — Ambulatory Visit: Payer: Self-pay | Admitting: Family Medicine

## 2024-06-25 ENCOUNTER — Other Ambulatory Visit: Payer: Self-pay | Admitting: Family Medicine

## 2024-07-17 ENCOUNTER — Encounter: Admitting: Family Medicine

## 2024-08-02 ENCOUNTER — Other Ambulatory Visit: Payer: Self-pay | Admitting: Family Medicine

## 2024-08-21 ENCOUNTER — Encounter: Payer: Self-pay | Admitting: Family Medicine

## 2024-08-21 ENCOUNTER — Ambulatory Visit (INDEPENDENT_AMBULATORY_CARE_PROVIDER_SITE_OTHER): Admitting: Family Medicine

## 2024-08-21 VITALS — BP 124/82 | HR 75 | Temp 98.1°F | Ht 65.34 in | Wt 222.0 lb

## 2024-08-21 DIAGNOSIS — E782 Mixed hyperlipidemia: Secondary | ICD-10-CM

## 2024-08-21 DIAGNOSIS — Z1211 Encounter for screening for malignant neoplasm of colon: Secondary | ICD-10-CM | POA: Diagnosis not present

## 2024-08-21 DIAGNOSIS — Z131 Encounter for screening for diabetes mellitus: Secondary | ICD-10-CM

## 2024-08-21 DIAGNOSIS — E559 Vitamin D deficiency, unspecified: Secondary | ICD-10-CM

## 2024-08-21 DIAGNOSIS — D72823 Leukemoid reaction: Secondary | ICD-10-CM

## 2024-08-21 DIAGNOSIS — Z1231 Encounter for screening mammogram for malignant neoplasm of breast: Secondary | ICD-10-CM

## 2024-08-21 DIAGNOSIS — Z23 Encounter for immunization: Secondary | ICD-10-CM

## 2024-08-21 DIAGNOSIS — H8110 Benign paroxysmal vertigo, unspecified ear: Secondary | ICD-10-CM

## 2024-08-21 DIAGNOSIS — I10 Essential (primary) hypertension: Secondary | ICD-10-CM

## 2024-08-21 DIAGNOSIS — Z0000 Encounter for general adult medical examination without abnormal findings: Secondary | ICD-10-CM | POA: Diagnosis not present

## 2024-08-21 DIAGNOSIS — F419 Anxiety disorder, unspecified: Secondary | ICD-10-CM

## 2024-08-21 MED ORDER — SERTRALINE HCL 50 MG PO TABS: 50.0000 mg | ORAL_TABLET | Freq: Every day | ORAL | 1 refills | Status: AC

## 2024-08-21 MED ORDER — VITAMIN D (ERGOCALCIFEROL) 1.25 MG (50000 UNIT) PO CAPS: 50000.0000 [IU] | ORAL_CAPSULE | ORAL | 3 refills | Status: AC

## 2024-08-21 MED ORDER — AMLODIPINE BESYLATE 5 MG PO TABS: ORAL_TABLET | ORAL | 1 refills | Status: AC

## 2024-08-21 MED ORDER — MECLIZINE HCL 25 MG PO TABS: 25.0000 mg | ORAL_TABLET | Freq: Three times a day (TID) | ORAL | 5 refills | Status: AC | PRN

## 2024-08-21 NOTE — Patient Instructions (Addendum)
 Return in about 24 weeks (around 02/05/2025) for Routine chronic condition follow-up.        Great to see you today.  I have refilled the medication(s) we provide.   If labs were collected or images ordered, we will inform you of  results once we have received them and reviewed. We will contact you either by echart message, or telephone call.  Please give ample time to the testing facility, and our office to run,  receive and review results. Please do not call inquiring of results, even if you can see them in your chart. We will contact you as soon as we are able. If it has been over 1 week since the test was completed, and you have not yet heard from us , then please call us .    - echart message- for normal results that have been seen by the patient already.   - telephone call: abnormal results or if patient has not viewed results in their echart.  If a referral to a specialist was entered for you, please call us  in 2 weeks if you have not heard from the specialist office to schedule.

## 2024-08-21 NOTE — Progress Notes (Signed)
 Patient ID: Erika Weaver, female  DOB: 1976-09-02, 48 y.o.   MRN: [id number] Patient Care Team    Relationship Specialty Notifications Start End  Catherine Charlies LABOR, DO PCP - General Family Medicine  07/15/19   Johnnye Ade, MD Consulting Physician Obstetrics and Gynecology  08/30/16   Timmy Maude SAUNDERS, MD Consulting Physician Oncology  08/25/19   Girard Sharper, MD Referring Physician Internal Medicine  08/25/19   Court Dorn PARAS, MD Consulting Physician Cardiology  08/25/19   Aneita Gwendlyn DASEN, MD (Inactive) Consulting Physician Gastroenterology  08/25/19     Chief Complaint  Patient presents with   Annual Exam    Chronic condition management Influenza vaccine- given    Subjective:  Erika Weaver is a 48 y.o.  Female  present for  CPE and Chronic Conditions/illness Management All past medical history, surgical history, allergies, family history, immunizations, medications and social history were updated in the electronic medical record today. All recent labs, ED visits and hospitalizations within the last year were reviewed.  Health maintenance Colonoscopy: MGM colon cancer in her 54s.  Cologuard 03/2021 normal.  Screening due- cologuard again Mammogram: completed: 06/15/2024,[facility]> ordered for 2026 Cervical cancer screening: last pap: 09/21/2018 with gyn.> requested- had last year Immunizations: tdap UTD 06/2023, Influenza- given today (encouraged yearly) Infectious disease screening: HIV completed, Hep C completed  DEXA: Routine screening to start at 60  Essential hypertension/Hyperlipidemia, mixed/Morbid obesity (HCC) Pt reports c compliance with amlodipine  5 mg daily.  Patient denies chest pain, shortness of breath, dizziness or lower extremity edema.   Pt does not daily baby ASA. Pt is not prescribed statin.   Anxiety/persistent postural perceptional vertigo Patient reports she was having dizziness and was getting frustrated secondary to diagnosis unable to be  identified.   She reports she still is est. cardiologist and then went on to a neurologist in [place] system.  She reports they diagnosed her w/ PPPV and started her on low-dose Zoloft .   Since that time she has had a great response to the medication with only occasional recurrence.  She is compliant with zoloft  50 mg qd and feels medication is working well for her.     01/01/2024    1:50 PM 07/17/2023    1:56 PM 11/05/2022    2:29 PM 02/12/2022    2:03 PM 11/15/2021    3:31 PM  Depression screen PHQ 2/9  Decreased Interest 0 0 0 0 0  Down, Depressed, Hopeless 0 0 0 0 0  PHQ - 2 Score 0 0 0 0 0  Altered sleeping 0 0 0 0 0  Tired, decreased energy 0 0 0 0 0  Change in appetite 0 0 0 0 0  Feeling bad or failure about yourself  0 0 0 0 0  Trouble concentrating 0 0 0 0 0  Moving slowly or fidgety/restless 0 0 0 0 0  Suicidal thoughts 0 0 0 0 0  PHQ-9 Score 0 0 0 0 0  Difficult doing work/chores Not difficult at all Not difficult at all         01/01/2024    1:50 PM 07/17/2023    1:57 PM 11/05/2022    2:30 PM 02/12/2022    2:06 PM  GAD 7 : Generalized Anxiety Score  Nervous, Anxious, on Edge 0 0 0 0  Control/stop worrying 0 0 0 0  Worry too much - different things 0 0 0 0  Trouble relaxing 0 0 0 0  Restless 0  0 0 0  Easily annoyed or irritable 0 0 0 0  Afraid - awful might happen 0 0  0  Total GAD 7 Score 0 0  0  Anxiety Difficulty Not difficult at all        Immunization History  Administered Date(s) Administered   Influenza Split 07/22/2009   Influenza Whole 07/23/2011   Influenza, Seasonal, Injecte, Preservative Fre 07/17/2023, 08/21/2024   Influenza,inj,Quad PF,6+ Mos 08/18/2013, 12/09/2014, 09/22/2015, 08/30/2016, 09/05/2017, 07/31/2018, 08/25/2019, 09/08/2020, 11/15/2021, 10/10/2022   Influenza,inj,quad, With Preservative 08/22/2017   Influenza-Unspecified 07/27/2011   PFIZER(Purple Top)SARS-COV-2 Vaccination 07/01/2020, 07/22/2020   Tdap 12/16/2009, 08/18/2013, 07/17/2023      Past Medical History:  Diagnosis Date   Allergic state 11/22/2011   Anxiety 11/22/2011   Back pain 01/31/2013   Bartholin cyst 03/01/2013   BPV (benign positional vertigo) 08/23/2013   Chicken pox as a child   Headache 11/22/2011   Overview:  Headache  ICD-10 cut over     Herpes zoster 12/18/2010   Hypertension    Kidney stone 12/21/2012   Other and unspecified hyperlipidemia 03/07/2014   Plantar fasciitis 01/11/2012   Rectal vaginal fistula 01/31/2013   Retrosternal chest pain 11/08/2015   Sinusitis 11/22/2011   Sleep apnea 04/03/2012   setting 2   Visual changes 11/22/2011   Allergies  Allergen Reactions   Topamax  [Topiramate ]     dizziness   Zonegran  [Zonisamide ]     dizziness   Past Surgical History:  Procedure Laterality Date   GUM SURGERY     PERINEAL BODY REPAIR     4 th degree tear with fistula between vagina and retum requiring 3 surgeries for correction   rectal fistula repair     x 2   WISDOM TOOTH EXTRACTION     Family History  Problem Relation Age of Onset   Diabetes Mother        type 2   Hypertension Mother    Hyperlipidemia Mother    Hypertension Father    Hyperlipidemia Father    Aneurysm Father        abdominal aorta   Cancer Maternal Grandmother 71       colon, ovarian   Other Maternal Grandfather 41       brain hemorrhage   Stroke Maternal Grandfather    Aneurysm Maternal Grandfather    Heart attack Paternal Grandmother    Heart disease Paternal Grandmother        MI   Alzheimer's disease Paternal Grandfather    Dementia Paternal Grandfather    Heart attack Paternal Aunt    Heart disease Paternal Aunt         AAA rupture   Other Paternal Uncle        cerebreal brain hemorrhage   Stroke Paternal Uncle    Aneurysm Paternal Uncle    Asthma Daughter    Other Daughter        peanut allergy   Heart disease Maternal Aunt        s/p cabg. brain aneurysm s/p coiling   Aneurysm Maternal Aunt        coil behind eye   Social  History   Social History Narrative   Marital status/children/pets: 1 child.  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   Safety:      -smoke alarm in the home:Yes     - wears seatbelt: Yes     - Feels safe in their relationships: Yes    Allergies  as of 08/21/2024       Reactions   Topamax  [topiramate ]    dizziness   Zonegran  [zonisamide ]    dizziness        Medication List        Accurate as of August 21, 2024  2:12 PM. If you have any questions, ask your nurse or doctor.          amLODipine  5 MG tablet Commonly known as: NORVASC  TAKE 1 TABLET BY MOUTH EVERY DAY   meclizine  25 MG tablet Commonly known as: ANTIVERT  Take 1 tablet (25 mg total) by mouth 3 (three) times daily as needed.   Multi-Vitamins Tabs Take by mouth.   sertraline  50 MG tablet Commonly known as: ZOLOFT  Take 1 tablet (50 mg total) by mouth daily.   Vitamin D  (Ergocalciferol ) 1.25 MG (50000 UNIT) Caps capsule Commonly known as: DRISDOL  Take 1 capsule (50,000 Units total) by mouth every 7 (seven) days.        All past medical history, surgical history, allergies, family history, immunizations andmedications were updated in the EMR today and reviewed under the history and medication portions of their EMR.      Review of Systems  All other systems reviewed and are negative.  14 pt review of systems performed and negative (unless mentioned in an HPI)  Objective: BP 124/82   Pulse 75   Temp 98.1 F (36.7 C)   Ht 5' 5.34 (1.66 m)   Wt 222 lb (100.7 kg)   LMP 07/11/2024   SpO2 98%   BMI 36.56 kg/m  Physical Exam Vitals and nursing note reviewed.  Constitutional:      General: She is not in acute distress.    Appearance: Normal appearance. She is obese. She is not ill-appearing, toxic-appearing or diaphoretic.  HENT:     Head: Normocephalic and atraumatic.     Right Ear: Tympanic membrane, ear canal and external ear normal. There is no impacted  cerumen.     Left Ear: Tympanic membrane, ear canal and external ear normal. There is no impacted cerumen.     Nose: No congestion or rhinorrhea.     Mouth/Throat:     Mouth: Mucous membranes are moist.     Pharynx: Oropharynx is clear. No oropharyngeal exudate or posterior oropharyngeal erythema.  Eyes:     General: No scleral icterus.       Right eye: No discharge.        Left eye: No discharge.     Extraocular Movements: Extraocular movements intact.     Conjunctiva/sclera: Conjunctivae normal.     Pupils: Pupils are equal, round, and reactive to light.  Cardiovascular:     Rate and Rhythm: Normal rate and regular rhythm.     Pulses: Normal pulses.     Heart sounds: Normal heart sounds. No murmur heard.    No friction rub. No gallop.  Pulmonary:     Effort: Pulmonary effort is normal. No respiratory distress.     Breath sounds: Normal breath sounds. No stridor. No wheezing, rhonchi or rales.  Chest:     Chest wall: No tenderness.  Abdominal:     General: Abdomen is flat. Bowel sounds are normal. There is no distension.     Palpations: Abdomen is soft. There is no mass.     Tenderness: There is no abdominal tenderness. There is no right CVA tenderness, left CVA tenderness, guarding or rebound.     Hernia: No hernia is present.  Musculoskeletal:  General: No swelling, tenderness or deformity. Normal range of motion.     Cervical back: Normal range of motion and neck supple. No rigidity or tenderness.     Right lower leg: No edema.     Left lower leg: No edema.  Lymphadenopathy:     Cervical: No cervical adenopathy.  Skin:    General: Skin is warm and dry.     Coloration: Skin is not jaundiced or pale.     Findings: No bruising, erythema, lesion or rash.  Neurological:     General: No focal deficit present.     Mental Status: She is alert and oriented to person, place, and time. Mental status is at baseline.     Cranial Nerves: No cranial nerve deficit.     Sensory:  No sensory deficit.     Motor: No weakness.     Coordination: Coordination normal.     Gait: Gait normal.     Deep Tendon Reflexes: Reflexes normal.  Psychiatric:        Mood and Affect: Mood normal.        Behavior: Behavior normal.        Thought Content: Thought content normal.        Judgment: Judgment normal.     No results found.  Assessment/plan: Erika Weaver is a 48 y.o. female present for CPE and Chronic Conditions/illness Management Essential hypertension/Hyperlipidemia, mixed/ Morbid obesity (HCC)/Body mass index is 36.16 kg/m. Stable Continue amlodipine  5 mg daily Low-sodium diet.   Labs collected today   Leukemoid reaction Patient with chronic elevation in WBCs for the past 2 years.   Follows with Dr. Timmy hematology.  No identifiable causes.  Benign paroxysmal positional vertigo, unspecified laterality/PPPD/anxiety Stable Patient has been evaluated by to local neurologist and [facility] neurology.  Continue zoloft  to 50 mg  Vitamin D  deficiency you know that -Vitamin D  levels collected today Continue supplement  Breast cancer screening by mammogram - MM 3D SCREENING MAMMOGRAM BILATERAL BREAST; Future Colon cancer screening - Cologuard Influenza vaccine needed - Flu vaccine trivalent PF, 6mos and older(Flulaval,Afluria,Fluarix,Fluzone) Diabetes mellitus screening - Hemoglobin A1c  Routine general medical examination at a health care facility (Primary) Patient was encouraged to exercise greater than 150 minutes a week. Patient was encouraged to choose a diet filled with fresh fruits and vegetables, and lean meats. AVS provided to patient today for education/recommendation on gender specific health and safety maintenance. Colonoscopy: MGM colon cancer in her 30s.  Cologuard 03/2021 normal.  Screening due- cologuard again Mammogram: completed: 06/15/2024,[facility]> ordered for 2026 Cervical cancer screening: last pap: 09/21/2018 with gyn.> requested- had last  year Immunizations: tdap UTD 06/2023, Influenza- given today (encouraged yearly) Infectious disease screening: HIV completed, Hep C completed  DEXA: Routine screening to start at 58 5 Ryan works like a squirrel you said space at that Halloween and now I need to watch him so that yeah this is okay I am hot summer.  There they are unsure but almost all while validating her normal yes I said this I think we yeah you Monday was the worst day it looks like it was still dark yes little urine here right so like that yeah yeah  Return in about 24 weeks (around 02/05/2025) for Routine chronic condition follow-up.  Orders Placed This Encounter  Procedures   MM 3D SCREENING MAMMOGRAM BILATERAL BREAST   Flu vaccine trivalent PF, 6mos and older(Flulaval,Afluria,Fluarix,Fluzone)   CBC   Comprehensive metabolic panel with GFR   Hemoglobin A1c  Lipid panel   TSH   Cologuard   Vitamin D  (25 hydroxy)   Meds ordered this encounter  Medications   amLODipine  (NORVASC ) 5 MG tablet    Sig: TAKE 1 TABLET BY MOUTH EVERY DAY    Dispense:  90 tablet    Refill:  1   meclizine  (ANTIVERT ) 25 MG tablet    Sig: Take 1 tablet (25 mg total) by mouth 3 (three) times daily as needed.    Dispense:  30 tablet    Refill:  5   Vitamin D , Ergocalciferol , (DRISDOL ) 1.25 MG (50000 UNIT) CAPS capsule    Sig: Take 1 capsule (50,000 Units total) by mouth every 7 (seven) days.    Dispense:  12 capsule    Refill:  3   sertraline  (ZOLOFT ) 50 MG tablet    Sig: Take 1 tablet (50 mg total) by mouth daily.    Dispense:  90 tablet    Refill:  1   Referral Orders  No referral(s) requested today     Electronically signed by: Charlies Bellini, DO [facility] [place]

## 2024-08-22 LAB — LIPID PANEL
Cholesterol: 179 mg/dL (ref <200)
HDL: 46 mg/dL — ABNORMAL LOW (ref >=50)
LDL Cholesterol (Calc): 108 mg/dL — ABNORMAL HIGH
Non-HDL Cholesterol (Calc): 133 mg/dL — ABNORMAL HIGH (ref <130)
Total CHOL/HDL Ratio: 3.9 (calc) (ref <5.0)
Triglycerides: 133 mg/dL (ref <150)

## 2024-08-22 LAB — COMPREHENSIVE METABOLIC PANEL WITH GFR
AG Ratio: 1.4 (calc) (ref 1.0–2.5)
ALT: 15 U/L (ref 6–29)
AST: 14 U/L (ref 10–35)
Albumin: 4 g/dL (ref 3.6–5.1)
Alkaline phosphatase (APISO): 76 U/L (ref 31–125)
BUN: 10 mg/dL (ref 7–25)
CO2: 26 mmol/L (ref 20–32)
Calcium: 9.3 mg/dL (ref 8.6–10.2)
Chloride: 103 mmol/L (ref 98–110)
Creat: 0.77 mg/dL (ref 0.50–0.99)
Globulin: 2.9 g/dL (ref 1.9–3.7)
Glucose, Bld: 85 mg/dL (ref 65–99)
Potassium: 3.9 mmol/L (ref 3.5–5.3)
Sodium: 136 mmol/L (ref 135–146)
Total Bilirubin: 0.4 mg/dL (ref 0.2–1.2)
Total Protein: 6.9 g/dL (ref 6.1–8.1)
eGFR: 95 mL/min/1.73m2 (ref >=60)

## 2024-08-22 LAB — CBC
HCT: 39.6 % (ref 35.0–45.0)
Hemoglobin: 13.4 g/dL (ref 11.7–15.5)
MCH: 31.2 pg (ref 27.0–33.0)
MCHC: 33.8 g/dL (ref 32.0–36.0)
MCV: 92.3 fL (ref 80.0–100.0)
MPV: 10.7 fL (ref 7.5–12.5)
Platelets: 313 Thousand/uL (ref 140–400)
RBC: 4.29 Million/uL (ref 3.80–5.10)
RDW: 12.2 % (ref 11.0–15.0)
WBC: 11.2 Thousand/uL — ABNORMAL HIGH (ref 3.8–10.8)

## 2024-08-22 LAB — HEMOGLOBIN A1C
Hgb A1c MFr Bld: 5.5 % (ref <5.7)
Mean Plasma Glucose: 111 mg/dL
eAG (mmol/L): 6.2 mmol/L

## 2024-08-22 LAB — VITAMIN D 25 HYDROXY (VIT D DEFICIENCY, FRACTURES): Vit D, 25-Hydroxy: 61 ng/mL (ref 30–100)

## 2024-08-22 LAB — TSH: TSH: 1.62 m[IU]/L

## 2024-08-24 ENCOUNTER — Ambulatory Visit: Payer: Self-pay | Admitting: Family Medicine

## 2024-08-24 NOTE — Telephone Encounter (Signed)
 Communication  Reason for CRM: Patient returned Simon Llamas's phone call, I relayed lab results to patient and she had no further questions   No further action needed at this time.

## 2024-10-04 LAB — COLOGUARD: COLOGUARD: NEGATIVE

## 2024-10-05 NOTE — Telephone Encounter (Signed)
 Please inform patient Cologuard is negative.  We will discuss colon cancer screening again in 3 years.

## 2025-02-05 ENCOUNTER — Ambulatory Visit: Admitting: Family Medicine

## 2025-02-10 ENCOUNTER — Ambulatory Visit: Admitting: Family Medicine
